# Patient Record
Sex: Female | Born: 1945 | Race: White | Hispanic: No | Marital: Married | Smoking: Never smoker
Health system: Southern US, Community
[De-identification: ages and names within clinical notes are randomized; demographics above are authoritative.]

## PROBLEM LIST (undated history)

## (undated) DIAGNOSIS — R112 Nausea with vomiting, unspecified: Secondary | ICD-10-CM

## (undated) DIAGNOSIS — Z9889 Other specified postprocedural states: Secondary | ICD-10-CM

## (undated) DIAGNOSIS — Z803 Family history of malignant neoplasm of breast: Secondary | ICD-10-CM

## (undated) DIAGNOSIS — D6851 Activated protein C resistance: Secondary | ICD-10-CM

## (undated) DIAGNOSIS — M8580 Other specified disorders of bone density and structure, unspecified site: Secondary | ICD-10-CM

## (undated) DIAGNOSIS — I8393 Asymptomatic varicose veins of bilateral lower extremities: Secondary | ICD-10-CM

## (undated) DIAGNOSIS — I82890 Acute embolism and thrombosis of other specified veins: Secondary | ICD-10-CM

## (undated) DIAGNOSIS — R760 Raised antibody titer: Secondary | ICD-10-CM

## (undated) DIAGNOSIS — Z460 Encounter for fitting and adjustment of spectacles and contact lenses: Secondary | ICD-10-CM

## (undated) HISTORY — DX: Acute embolism and thrombosis of other specified veins: I82.890

## (undated) HISTORY — DX: Other specified disorders of bone density and structure, unspecified site: M85.80

## (undated) HISTORY — DX: Raised antibody titer: R76.0

## (undated) HISTORY — PX: BARTHOLIN CYST MARSUPIALIZATION: SHX5383

## (undated) HISTORY — DX: Family history of malignant neoplasm of breast: Z80.3

## (undated) HISTORY — DX: Asymptomatic varicose veins of bilateral lower extremities: I83.93

## (undated) HISTORY — DX: Activated protein C resistance: D68.51

---
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
## 1975-05-11 HISTORY — PX: TUBAL LIGATION: SHX77

## 1995-05-11 DIAGNOSIS — I82890 Acute embolism and thrombosis of other specified veins: Secondary | ICD-10-CM

## 1995-05-11 HISTORY — PX: TOTAL ABDOMINAL HYSTERECTOMY W/ BILATERAL SALPINGOOPHORECTOMY: SHX83

## 1995-05-11 HISTORY — DX: Acute embolism and thrombosis of other specified veins: I82.890

## 1997-08-29 ENCOUNTER — Other Ambulatory Visit: Admission: RE | Admit: 1997-08-29 | Discharge: 1997-08-29 | Payer: Self-pay | Admitting: Obstetrics and Gynecology

## 1998-09-01 ENCOUNTER — Other Ambulatory Visit: Admission: RE | Admit: 1998-09-01 | Discharge: 1998-09-01 | Payer: Self-pay | Admitting: Obstetrics and Gynecology

## 1999-09-02 ENCOUNTER — Other Ambulatory Visit: Admission: RE | Admit: 1999-09-02 | Discharge: 1999-09-02 | Payer: Self-pay | Admitting: Obstetrics and Gynecology

## 2000-05-10 HISTORY — PX: FINGER SURGERY: SHX640

## 2000-06-10 HISTORY — PX: GANGLION CYST EXCISION: SHX1691

## 2000-06-21 ENCOUNTER — Ambulatory Visit (HOSPITAL_BASED_OUTPATIENT_CLINIC_OR_DEPARTMENT_OTHER): Admission: RE | Admit: 2000-06-21 | Discharge: 2000-06-21 | Payer: Self-pay | Admitting: Orthopedic Surgery

## 2000-09-05 ENCOUNTER — Other Ambulatory Visit: Admission: RE | Admit: 2000-09-05 | Discharge: 2000-09-05 | Payer: Self-pay | Admitting: Obstetrics and Gynecology

## 2001-01-25 ENCOUNTER — Ambulatory Visit: Admission: RE | Admit: 2001-01-25 | Discharge: 2001-01-25 | Payer: Self-pay

## 2003-10-03 ENCOUNTER — Other Ambulatory Visit: Admission: RE | Admit: 2003-10-03 | Discharge: 2003-10-03 | Payer: Self-pay | Admitting: Obstetrics and Gynecology

## 2004-10-13 ENCOUNTER — Other Ambulatory Visit: Admission: RE | Admit: 2004-10-13 | Discharge: 2004-10-13 | Payer: Self-pay | Admitting: Obstetrics and Gynecology

## 2004-10-19 ENCOUNTER — Encounter: Admission: RE | Admit: 2004-10-19 | Discharge: 2004-10-19 | Payer: Self-pay | Admitting: Obstetrics and Gynecology

## 2004-11-02 ENCOUNTER — Ambulatory Visit: Payer: Self-pay | Admitting: Hematology & Oncology

## 2005-11-08 ENCOUNTER — Other Ambulatory Visit: Admission: RE | Admit: 2005-11-08 | Discharge: 2005-11-08 | Payer: Self-pay | Admitting: Obstetrics and Gynecology

## 2006-05-23 ENCOUNTER — Encounter: Admission: RE | Admit: 2006-05-23 | Discharge: 2006-05-23 | Payer: Self-pay | Admitting: Obstetrics and Gynecology

## 2006-07-22 ENCOUNTER — Encounter: Admission: RE | Admit: 2006-07-22 | Discharge: 2006-07-22 | Payer: Self-pay | Admitting: Obstetrics and Gynecology

## 2006-09-28 ENCOUNTER — Encounter: Admission: RE | Admit: 2006-09-28 | Discharge: 2006-09-28 | Payer: Self-pay | Admitting: Obstetrics and Gynecology

## 2006-10-06 ENCOUNTER — Encounter: Admission: RE | Admit: 2006-10-06 | Discharge: 2006-10-06 | Payer: Self-pay | Admitting: Obstetrics and Gynecology

## 2006-11-10 ENCOUNTER — Other Ambulatory Visit: Admission: RE | Admit: 2006-11-10 | Discharge: 2006-11-10 | Payer: Self-pay | Admitting: Obstetrics and Gynecology

## 2006-12-07 ENCOUNTER — Encounter: Admission: RE | Admit: 2006-12-07 | Discharge: 2006-12-07 | Payer: Self-pay | Admitting: Obstetrics and Gynecology

## 2007-05-11 HISTORY — PX: CATARACT EXTRACTION: SUR2

## 2007-10-02 ENCOUNTER — Ambulatory Visit: Payer: Self-pay | Admitting: Emergency Medicine

## 2007-10-02 ENCOUNTER — Inpatient Hospital Stay (HOSPITAL_COMMUNITY): Admission: EM | Admit: 2007-10-02 | Discharge: 2007-10-03 | Payer: Self-pay | Admitting: Emergency Medicine

## 2007-10-04 ENCOUNTER — Encounter: Admission: RE | Admit: 2007-10-04 | Discharge: 2007-10-04 | Payer: Self-pay | Admitting: Obstetrics and Gynecology

## 2007-10-05 ENCOUNTER — Ambulatory Visit: Payer: Self-pay | Admitting: Emergency Medicine

## 2007-10-05 DIAGNOSIS — T17908A Unspecified foreign body in respiratory tract, part unspecified causing other injury, initial encounter: Secondary | ICD-10-CM | POA: Insufficient documentation

## 2007-10-05 DIAGNOSIS — J45909 Unspecified asthma, uncomplicated: Secondary | ICD-10-CM | POA: Insufficient documentation

## 2007-10-05 DIAGNOSIS — M1990 Unspecified osteoarthritis, unspecified site: Secondary | ICD-10-CM | POA: Insufficient documentation

## 2007-10-19 ENCOUNTER — Ambulatory Visit: Payer: Self-pay | Admitting: Emergency Medicine

## 2007-10-19 ENCOUNTER — Ambulatory Visit (HOSPITAL_COMMUNITY): Admission: RE | Admit: 2007-10-19 | Discharge: 2007-10-19 | Payer: Self-pay | Admitting: Emergency Medicine

## 2007-10-26 ENCOUNTER — Ambulatory Visit: Payer: Self-pay | Admitting: Emergency Medicine

## 2007-11-15 ENCOUNTER — Other Ambulatory Visit: Admission: RE | Admit: 2007-11-15 | Discharge: 2007-11-15 | Payer: Self-pay | Admitting: Obstetrics and Gynecology

## 2008-10-11 ENCOUNTER — Encounter: Admission: RE | Admit: 2008-10-11 | Discharge: 2008-10-11 | Payer: Self-pay | Admitting: Obstetrics and Gynecology

## 2008-11-15 ENCOUNTER — Other Ambulatory Visit: Admission: RE | Admit: 2008-11-15 | Discharge: 2008-11-15 | Payer: Self-pay | Admitting: Obstetrics and Gynecology

## 2009-10-13 ENCOUNTER — Encounter: Admission: RE | Admit: 2009-10-13 | Discharge: 2009-10-13 | Payer: Self-pay | Admitting: Obstetrics & Gynecology

## 2010-09-10 ENCOUNTER — Other Ambulatory Visit: Payer: Self-pay | Admitting: Obstetrics & Gynecology

## 2010-09-10 DIAGNOSIS — Z1231 Encounter for screening mammogram for malignant neoplasm of breast: Secondary | ICD-10-CM

## 2010-09-22 NOTE — Discharge Summary (Signed)
[name], [name]                  ACCOUNT NO.:  [id number]   MEDICAL RECORD NO.:  [id number]          PATIENT TYPE:  INP   LOCATION:  1417                         FACILITY:  [facility]   PHYSICIAN:  Leslye Peer, MD    DATE OF BIRTH:  1946/04/29   DATE OF ADMISSION:  10/02/2007  DATE OF DISCHARGE:  10/03/2007                               DISCHARGE SUMMARY   DISCHARGE DIAGNOSIS:  Aspiration of pill.   HISTORY OF PRESENT ILLNESS:  [name] is a pleasant 65 year old female  with little past medical history beyond possible mild intermittent  asthma which she has rarely used albuterol.  She tells of taking her  pills at home prior to her admission when suddenly became choked and had  a coughing attack.  She is confident she aspirated at least one of the  pills.  She is not sure whether she swallowed the other two pills that  were in her mouth or whether she aspirated these also.  She is admitted  for further evaluation and treatment.   DISCHARGE DIAGNOSES:  Aspiration of foreign body.   LABORATORY DATA:  Chest x-ray shows radiodensity, particularly in the  right lower lobe bronchus that is no longer readily apparently.  Questionable extraction, no pneumothorax.  WBC 5.7, hemoglobin 13.7,  hematocrit 30.9, platelets 230.  PTT is 29.  Sodium 141, potassium 3.9,  chloride 108, CO2 25, glucose 106, BUN 17, creatinine 0.76.   HOSPITAL COURSE:  Aspiration of foreign body.  She was admitted to  [facility].  She actively coughed out the pill.  Therefore, no  further interventions were needed.  She reached maximum hospital benefit  by Oct 03, 2007, and was discharged home.  She is to follow up with Dr.  Delton Coombes within one week, [phone].  Medication area as before, diet is as  before.  She is to increased activity slowly.   DISPOSITION:  Improved with having successfully coughed out her  aspirated pill.  Did not require further intervention.      Devra Dopp, MSN, ACNP      Leslye Peer, MD  Electronically Signed    SM/MEDQ  D:  10/31/2007  T:  10/31/2007  Job:  [id number]

## 2010-09-22 NOTE — H&P (Signed)
[name], [name]                  ACCOUNT NO.:  [id number]   MEDICAL RECORD NO.:  [id number]          PATIENT TYPE:  INP   LOCATION:  0103                         FACILITY:  [facility]   PHYSICIAN:  Leslye Peer, MD    DATE OF BIRTH:  [birth year]   DATE OF ADMISSION:  10/02/2007  DATE OF DISCHARGE:                              HISTORY & PHYSICAL   CHIEF COMPLAINT:  Aspiration of a pill.   BRIEF HISTORY:  [name] is a pleasant 65 year old woman with little  past medical history beyond some possible mild intermittent asthma for  which she very rarely uses albuterol.  She tells me that she was taking  three of her pills at the same time earlier this evening, when she  suddenly became choked up and had a coughing fit.  She is confident that  she aspirated at least one of the pills.  She is not sure whether she  swallowed the other 2 pills that were in her mouth or whether she  aspirated those as well.  She presented to the emergency department with  cough, chest discomfort and dyspnea.  She had a definite sensation of a  foreign body present on the right.  Chest x-ray confirmed a pill on the  right, probably in the distal bronchus intermedius versus the proximal  right lower lobe bronchus.  There was some associated distal segmental  atelectasis.  Pulmonary was asked to evaluate and admit the patient.  In  the emergency department, she subsequently coughed up the foreign body  which appeared to be an intact pill, and she felt much better.  Her  cough has begun to resolve.  She no longer has a foreign body sensation,  and her breathing is easier.   PAST MEDICAL HISTORY:  1. Possible mild asthma with no history of PFTs.  She has used      albuterol on as-needed basis in the past.  2. Osteoarthritis.  3. Borderline lipids.   PAST SURGICAL HISTORY:  1. History of a C-section.  2. Hysterectomy.   ALLERGIES:  FLOXIN.   HOME MEDICATIONS.:  1. Estrace 0.5 mg on Monday, Wednesday,  Friday.  2. Aspirin 81 mg daily.  3. Multivitamin once daily.  4. Glucosamine one capsule b.i.d.  5. Naproxen one daily.  6. Calcium plus vitamin D one daily.  7. Flaxseed oil one daily.   SOCIAL HISTORY:  The patient has worked as a Futures trader.  She denies any  significant occupational exposures.  She is a never smoker and rarely  uses alcohol.   FAMILY HISTORY:  Significant for coronary artery disease cancer and  diabetes mellitus.   PHYSICAL EXAMINATION:  GENERAL:  This a very well-appearing, comfortable  woman in no distress on room air.  VITAL SIGNS:  Temperature is 97.9, blood pressure 135/75, heart rate 79,  respiratory rate 20, SPO2 98% on room air.  HEENT:  The oropharynx is clear.  LUNGS:  She has a Mallampati one to two airway.  Clear without any  wheezing or rhonchi.  There is good air movement on the right  in the  midlung zone where the foreign body was originally located.  CARDIOVASCULAR:  There is regular rate and rhythm without murmur.  ABDOMEN:  Benign.  Extremities have no cyanosis, clubbing or edema.  NEUROLOGIC:  She has a nonfocal exam.   LABORATORY DATA:  White blood cell count 5.7, hematocrit 39.8, platelets  230, PTT 29.  Sodium 141, potassium 3.9, chloride 108, CO2 25, BUN 17,  creatinine 0.76, glucose 106, calcium 9.5.  A chest x-ray is as  indicated above.   IMPRESSION:  This very pleasant 65 year old woman without much past  medical history presents with aspirated  foreign body.  It appeared to  be originally in the distal bronchus intermedius versus the proximal  right lower lobe bronchus on chest x-ray.  She may have cleared it with  her cough as she now feels significantly better, and she did bring up  foreign material.  She still probably merits an inspection fiberoptic  bronchoscopy, and if possible,  remaining foreign body removal.  I will  repeat her chest x-ray now to see if the foreign body is still present  or if her atelectasis has  improved.  We will perform a fiberoptic  bronchoscopy either tonight or in the a.m. depending on her chest x-ray  results and my ability to obtain an adequate video bronchoscope,  the  proper forceps, snares, etc. to capture the foreign body.  I will follow  her for fever, sputum or other signs of infection since she is at risk  for post obstructive pneumonia.  I will not start empiric antibiotics at  this time.      Leslye Peer, MD  Electronically Signed     RSB/MEDQ  D:  10/03/2007  T:  10/03/2007  Job:  [id number]

## 2010-09-25 NOTE — Op Note (Signed)
[place]  Patient:    [name], [name]                         MRN: [id number] Proc. Date: 06/21/00 Adm. Date:  [id number] Attending:  Susa Day                           Operative Report  PREOPERATIVE DIAGNOSIS:  Enlarging mass, left long finger, flexor sheath at A1/A2 pulley.  POSTOPERATIVE DIAGNOSIS:  Large flexor sheath ganglion, left long finger.  OPERATION PERFORMED:  Excisional biopsy of left long finger flexor sheath ganglion.  SURGEON:  Katy Fitch. Sypher, Montez Hageman., M.D.  ASSISTANT:  Jonni Sanger, P.A.  ANESTHESIA:  0.25% Marcaine and 2% lidocaine metacarpal head level block left long finger supplemented by IV sedation.  SUPERVISING ANESTHESIOLOGIST:  Dr. Krista Blue.  INDICATIONS FOR PROCEDURE:  The patient is a 65 year old woman who noted a mass enlarging in her left palm adjacent to the long finger flexor sheath. She sought a hand surgery consult.  The mass was consistent with a flexor sheath ganglion.  Due to failure to respond to nonoperative management, the patient is brought to the operating room at this time for excision of her left long finger flexor sheath ganglion.  DESCRIPTION OF PROCEDURE:  [name] was brought to the operating room and placed in supine position on the operating table.  Following light sedation, the left arm was prepped with Betadine soap and solution and sterilely draped. Following exsanguination of the limb with an Esmarch bandage, the arterial tourniquet was inflated to 220 mmHg.  The procedure commenced with a short oblique incision directly over the mass.  Subcutaneous tissues were carefully divided taking care to identify the ulnar proper digital nerve that was tented directly over the mass.  The masses was circumferentially dissected and blunt retractors placed to protect the neurovascular structures.  The mass was then excised with scissors and a rongeur.  Care was taken to remove  all remnants of the cyst membrane from the flexor sheath.  The bed of the cyst was then electrocauterized with bipolar current.  The wound was then repaired with interrupted sutures of 5-0 nylon.  A compressive dressing was applied with Xeroflo, sterile gauze and Coban.  There were no apparent complications.  The tourniquet was released and immediate capillary refill in all fingers.  [name] was given prescriptions for Vicodin 5 mg 1 or 2 tablets p.o. q.4-6h. p.r.n. pain, 20 tablets without refill.  She will return to our office for interval follow-up in approximately one week. DD:  06/21/00 TD:  06/21/00 Job: [id number]

## 2010-10-16 ENCOUNTER — Ambulatory Visit
Admission: RE | Admit: 2010-10-16 | Discharge: 2010-10-16 | Disposition: A | Discharge status: Home / Self Care | Payer: BC Managed Care – PPO | Source: Ambulatory Visit | Attending: Obstetrics & Gynecology | Admitting: Obstetrics & Gynecology

## 2010-10-16 ENCOUNTER — Other Ambulatory Visit: Payer: Self-pay | Admitting: Obstetrics & Gynecology

## 2010-10-16 DIAGNOSIS — Z1231 Encounter for screening mammogram for malignant neoplasm of breast: Secondary | ICD-10-CM

## 2010-10-19 ENCOUNTER — Other Ambulatory Visit: Payer: Self-pay | Admitting: Obstetrics & Gynecology

## 2010-10-19 DIAGNOSIS — N644 Mastodynia: Secondary | ICD-10-CM

## 2010-10-23 ENCOUNTER — Ambulatory Visit
Admission: RE | Admit: 2010-10-23 | Discharge: 2010-10-23 | Disposition: A | Discharge status: Home / Self Care | Payer: BC Managed Care – PPO | Source: Ambulatory Visit | Attending: Obstetrics & Gynecology | Admitting: Obstetrics & Gynecology

## 2010-10-23 ENCOUNTER — Other Ambulatory Visit: Payer: Self-pay | Admitting: Obstetrics & Gynecology

## 2010-10-23 DIAGNOSIS — N644 Mastodynia: Secondary | ICD-10-CM

## 2011-02-03 LAB — CBC
MCV: 93
Platelets: 230
RBC: 4.28
RDW: 11.7

## 2011-02-03 LAB — DIFFERENTIAL
Basophils Absolute: 0
Eosinophils Relative: 2
Monocytes Absolute: 0.4
Neutro Abs: 2.5
Neutrophils Relative %: 44

## 2011-02-03 LAB — BASIC METABOLIC PANEL
BUN: 17
CO2: 25
Calcium: 9.5
Creatinine, Ser: 0.76
GFR calc Af Amer: >60
GFR calc non Af Amer: >60
Glucose, Bld: 106 — ABNORMAL HIGH
Sodium: 141

## 2011-03-24 ENCOUNTER — Other Ambulatory Visit: Payer: Self-pay | Admitting: Obstetrics & Gynecology

## 2011-03-24 DIAGNOSIS — M8580 Other specified disorders of bone density and structure, unspecified site: Secondary | ICD-10-CM

## 2011-04-07 ENCOUNTER — Ambulatory Visit
Admission: RE | Admit: 2011-04-07 | Discharge: 2011-04-07 | Disposition: A | Discharge status: Home / Self Care | Payer: BC Managed Care – PPO | Source: Ambulatory Visit | Attending: Obstetrics & Gynecology | Admitting: Obstetrics & Gynecology

## 2011-04-07 DIAGNOSIS — M8580 Other specified disorders of bone density and structure, unspecified site: Secondary | ICD-10-CM

## 2011-09-29 ENCOUNTER — Other Ambulatory Visit: Payer: Self-pay | Admitting: Obstetrics & Gynecology

## 2011-09-29 DIAGNOSIS — Z1231 Encounter for screening mammogram for malignant neoplasm of breast: Secondary | ICD-10-CM

## 2011-10-20 ENCOUNTER — Ambulatory Visit
Admission: RE | Admit: 2011-10-20 | Discharge: 2011-10-20 | Disposition: A | Discharge status: Home / Self Care | Payer: Medicare Other | Source: Ambulatory Visit | Attending: Obstetrics & Gynecology | Admitting: Obstetrics & Gynecology

## 2011-10-20 DIAGNOSIS — Z1231 Encounter for screening mammogram for malignant neoplasm of breast: Secondary | ICD-10-CM

## 2012-02-14 DIAGNOSIS — H43399 Other vitreous opacities, unspecified eye: Secondary | ICD-10-CM | POA: Diagnosis not present

## 2012-02-14 DIAGNOSIS — H2510 Age-related nuclear cataract, unspecified eye: Secondary | ICD-10-CM | POA: Diagnosis not present

## 2012-02-14 DIAGNOSIS — H40019 Open angle with borderline findings, low risk, unspecified eye: Secondary | ICD-10-CM | POA: Diagnosis not present

## 2012-02-14 DIAGNOSIS — H35379 Puckering of macula, unspecified eye: Secondary | ICD-10-CM | POA: Diagnosis not present

## 2012-03-06 DIAGNOSIS — M899 Disorder of bone, unspecified: Secondary | ICD-10-CM | POA: Diagnosis not present

## 2012-03-06 DIAGNOSIS — Z23 Encounter for immunization: Secondary | ICD-10-CM | POA: Diagnosis not present

## 2012-03-06 DIAGNOSIS — Z0000 Encounter for general adult medical examination without abnormal findings: Secondary | ICD-10-CM | POA: Diagnosis not present

## 2012-03-06 DIAGNOSIS — Z131 Encounter for screening for diabetes mellitus: Secondary | ICD-10-CM | POA: Diagnosis not present

## 2012-03-06 DIAGNOSIS — E7800 Pure hypercholesterolemia, unspecified: Secondary | ICD-10-CM | POA: Diagnosis not present

## 2012-03-06 DIAGNOSIS — M949 Disorder of cartilage, unspecified: Secondary | ICD-10-CM | POA: Diagnosis not present

## 2012-04-18 DIAGNOSIS — R7989 Other specified abnormal findings of blood chemistry: Secondary | ICD-10-CM | POA: Diagnosis not present

## 2012-04-18 DIAGNOSIS — Z124 Encounter for screening for malignant neoplasm of cervix: Secondary | ICD-10-CM | POA: Diagnosis not present

## 2012-04-18 DIAGNOSIS — Z0000 Encounter for general adult medical examination without abnormal findings: Secondary | ICD-10-CM | POA: Diagnosis not present

## 2012-04-18 DIAGNOSIS — Z01419 Encounter for gynecological examination (general) (routine) without abnormal findings: Secondary | ICD-10-CM | POA: Diagnosis not present

## 2012-04-24 DIAGNOSIS — Z0000 Encounter for general adult medical examination without abnormal findings: Secondary | ICD-10-CM | POA: Diagnosis not present

## 2012-06-29 DIAGNOSIS — L821 Other seborrheic keratosis: Secondary | ICD-10-CM | POA: Diagnosis not present

## 2012-06-29 DIAGNOSIS — L819 Disorder of pigmentation, unspecified: Secondary | ICD-10-CM | POA: Diagnosis not present

## 2012-06-29 DIAGNOSIS — L219 Seborrheic dermatitis, unspecified: Secondary | ICD-10-CM | POA: Diagnosis not present

## 2012-06-29 DIAGNOSIS — L988 Other specified disorders of the skin and subcutaneous tissue: Secondary | ICD-10-CM | POA: Diagnosis not present

## 2012-09-19 ENCOUNTER — Other Ambulatory Visit: Payer: Self-pay

## 2012-09-19 DIAGNOSIS — Z1231 Encounter for screening mammogram for malignant neoplasm of breast: Secondary | ICD-10-CM

## 2012-11-01 ENCOUNTER — Ambulatory Visit
Admission: RE | Admit: 2012-11-01 | Discharge: 2012-11-01 | Disposition: A | Discharge status: Home / Self Care | Payer: Medicare Other | Source: Ambulatory Visit

## 2012-11-01 DIAGNOSIS — Z1231 Encounter for screening mammogram for malignant neoplasm of breast: Secondary | ICD-10-CM

## 2012-11-30 DIAGNOSIS — G5600 Carpal tunnel syndrome, unspecified upper limb: Secondary | ICD-10-CM | POA: Diagnosis not present

## 2012-11-30 DIAGNOSIS — M19049 Primary osteoarthritis, unspecified hand: Secondary | ICD-10-CM | POA: Diagnosis not present

## 2012-12-01 ENCOUNTER — Other Ambulatory Visit: Payer: Self-pay | Admitting: Orthopedic Surgery

## 2012-12-01 DIAGNOSIS — N3000 Acute cystitis without hematuria: Secondary | ICD-10-CM | POA: Diagnosis not present

## 2012-12-13 DIAGNOSIS — N3000 Acute cystitis without hematuria: Secondary | ICD-10-CM | POA: Diagnosis not present

## 2012-12-13 DIAGNOSIS — R300 Dysuria: Secondary | ICD-10-CM | POA: Diagnosis not present

## 2012-12-14 ENCOUNTER — Encounter (HOSPITAL_BASED_OUTPATIENT_CLINIC_OR_DEPARTMENT_OTHER): Payer: Self-pay | Admitting: *Deleted

## 2012-12-14 NOTE — Progress Notes (Signed)
No labs needed

## 2012-12-20 ENCOUNTER — Ambulatory Visit (HOSPITAL_BASED_OUTPATIENT_CLINIC_OR_DEPARTMENT_OTHER): Payer: Medicare Other | Admitting: *Deleted

## 2012-12-20 ENCOUNTER — Ambulatory Visit (HOSPITAL_BASED_OUTPATIENT_CLINIC_OR_DEPARTMENT_OTHER)
Admission: RE | Admit: 2012-12-20 | Discharge: 2012-12-20 | Disposition: A | Discharge status: Home / Self Care | Payer: Medicare Other | Source: Ambulatory Visit | Attending: Orthopedic Surgery | Admitting: Orthopedic Surgery

## 2012-12-20 ENCOUNTER — Encounter (HOSPITAL_BASED_OUTPATIENT_CLINIC_OR_DEPARTMENT_OTHER)
Admission: RE | Disposition: A | Discharge status: Home / Self Care | Payer: Self-pay | Source: Ambulatory Visit | Attending: Orthopedic Surgery

## 2012-12-20 ENCOUNTER — Encounter (HOSPITAL_BASED_OUTPATIENT_CLINIC_OR_DEPARTMENT_OTHER): Payer: Self-pay | Admitting: *Deleted

## 2012-12-20 DIAGNOSIS — Z883 Allergy status to other anti-infective agents status: Secondary | ICD-10-CM | POA: Diagnosis not present

## 2012-12-20 DIAGNOSIS — G5600 Carpal tunnel syndrome, unspecified upper limb: Secondary | ICD-10-CM | POA: Diagnosis not present

## 2012-12-20 HISTORY — PX: CARPAL TUNNEL RELEASE: SHX101

## 2012-12-20 HISTORY — DX: Encounter for fitting and adjustment of spectacles and contact lenses: Z46.0

## 2012-12-20 SURGERY — CARPAL TUNNEL RELEASE
Anesthesia: Regional | Site: Wrist | Laterality: Right | Wound class: Clean

## 2012-12-20 MED ORDER — OXYCODONE HCL 5 MG PO TABS
5.0000 mg | ORAL_TABLET | Freq: Once | ORAL | Status: AC
  Administered 2012-12-20: 5 mg via ORAL

## 2012-12-20 MED ORDER — HYDROCODONE-ACETAMINOPHEN 5-325 MG PO TABS: 1.0000 | ORAL_TABLET | Freq: Four times a day (QID) | ORAL | Status: DC | PRN

## 2012-12-20 MED ORDER — MIDAZOLAM HCL 2 MG/2ML IJ SOLN: 1.0000 mg | INTRAMUSCULAR | Status: DC | PRN

## 2012-12-20 MED ORDER — CHLORHEXIDINE GLUCONATE 4 % EX LIQD: 60.0000 mL | Freq: Once | CUTANEOUS | Status: DC

## 2012-12-20 MED ORDER — ONDANSETRON HCL 4 MG/2ML IJ SOLN: 4.0000 mg | Freq: Once | INTRAMUSCULAR | Status: DC | PRN

## 2012-12-20 MED ORDER — BUPIVACAINE HCL (PF) 0.25 % IJ SOLN
INTRAMUSCULAR | Status: DC | PRN
  Administered 2012-12-20: 6 mL

## 2012-12-20 MED ORDER — LIDOCAINE HCL (PF) 0.5 % IJ SOLN
INTRAMUSCULAR | Status: DC | PRN
  Administered 2012-12-20: 30 mL via INTRAVENOUS

## 2012-12-20 MED ORDER — FENTANYL CITRATE 0.05 MG/ML IJ SOLN: 50.0000 ug | INTRAMUSCULAR | Status: DC | PRN

## 2012-12-20 MED ORDER — MIDAZOLAM HCL 5 MG/5ML IJ SOLN
INTRAMUSCULAR | Status: DC | PRN
  Administered 2012-12-20: 1 mg via INTRAVENOUS

## 2012-12-20 MED ORDER — FENTANYL CITRATE 0.05 MG/ML IJ SOLN
INTRAMUSCULAR | Status: DC | PRN
  Administered 2012-12-20: 25 ug via INTRAVENOUS
  Administered 2012-12-20: 50 ug via INTRAVENOUS

## 2012-12-20 MED ORDER — FENTANYL CITRATE 0.05 MG/ML IJ SOLN
25.0000 ug | INTRAMUSCULAR | Status: DC | PRN
  Administered 2012-12-20: 50 ug via INTRAVENOUS
  Administered 2012-12-20: 25 ug via INTRAVENOUS

## 2012-12-20 MED ORDER — PROPOFOL INFUSION 10 MG/ML OPTIME
INTRAVENOUS | Status: DC | PRN
  Administered 2012-12-20: 100 ug/kg/min via INTRAVENOUS

## 2012-12-20 MED ORDER — CEFAZOLIN SODIUM-DEXTROSE 2-3 GM-% IV SOLR
2.0000 g | INTRAVENOUS | Status: AC
  Administered 2012-12-20: 2 g via INTRAVENOUS

## 2012-12-20 MED ORDER — LACTATED RINGERS IV SOLN
INTRAVENOUS | Status: DC
  Administered 2012-12-20: 09:00:00 via INTRAVENOUS

## 2012-12-20 SURGICAL SUPPLY — 40 items
BANDAGE GAUZE ELAST BULKY 4 IN (GAUZE/BANDAGES/DRESSINGS) ×2 IMPLANT
BLADE SURG 15 STRL LF DISP TIS (BLADE) ×1 IMPLANT
BLADE SURG 15 STRL SS (BLADE) ×1
BNDG COHESIVE 3X5 TAN STRL LF (GAUZE/BANDAGES/DRESSINGS) ×2 IMPLANT
BNDG ESMARK 4X9 LF (GAUZE/BANDAGES/DRESSINGS) ×2 IMPLANT
CHLORAPREP W/TINT 26ML (MISCELLANEOUS) ×2 IMPLANT
CLOTH BEACON ORANGE TIMEOUT ST (SAFETY) ×2 IMPLANT
CORDS BIPOLAR (ELECTRODE) ×2 IMPLANT
COVER MAYO STAND STRL (DRAPES) ×2 IMPLANT
COVER TABLE BACK 60X90 (DRAPES) ×2 IMPLANT
CUFF TOURNIQUET SINGLE 18IN (TOURNIQUET CUFF) ×2 IMPLANT
DRAPE EXTREMITY T 121X128X90 (DRAPE) ×2 IMPLANT
DRAPE SURG 17X23 STRL (DRAPES) ×2 IMPLANT
DRSG KUZMA FLUFF (GAUZE/BANDAGES/DRESSINGS) ×2 IMPLANT
GAUZE XEROFORM 1X8 LF (GAUZE/BANDAGES/DRESSINGS) ×2 IMPLANT
GLOVE BIO SURGEON STRL SZ 6.5 (GLOVE) IMPLANT
GLOVE BIOGEL PI IND STRL 7.0 (GLOVE) ×1 IMPLANT
GLOVE BIOGEL PI IND STRL 8.5 (GLOVE) ×1 IMPLANT
GLOVE BIOGEL PI INDICATOR 7.0 (GLOVE) ×1
GLOVE BIOGEL PI INDICATOR 8.5 (GLOVE) ×1
GLOVE ECLIPSE 6.5 STRL STRAW (GLOVE) ×2 IMPLANT
GLOVE EXAM NITRILE LRG STRL (GLOVE) ×2 IMPLANT
GLOVE SURG ORTHO 8.0 STRL STRW (GLOVE) ×2 IMPLANT
GOWN BRE IMP PREV XXLGXLNG (GOWN DISPOSABLE) ×2 IMPLANT
GOWN PREVENTION PLUS XLARGE (GOWN DISPOSABLE) ×2 IMPLANT
NEEDLE 27GAX1X1/2 (NEEDLE) ×2 IMPLANT
NS IRRIG 1000ML POUR BTL (IV SOLUTION) ×2 IMPLANT
PACK BASIN DAY SURGERY FS (CUSTOM PROCEDURE TRAY) ×2 IMPLANT
PAD CAST 3X4 CTTN HI CHSV (CAST SUPPLIES) ×1 IMPLANT
PADDING CAST ABS 4INX4YD NS (CAST SUPPLIES)
PADDING CAST ABS COTTON 4X4 ST (CAST SUPPLIES) IMPLANT
PADDING CAST COTTON 3X4 STRL (CAST SUPPLIES) ×1
SPONGE GAUZE 4X4 12PLY (GAUZE/BANDAGES/DRESSINGS) ×2 IMPLANT
STOCKINETTE 4X48 STRL (DRAPES) ×2 IMPLANT
SUT VICRYL 4-0 PS2 18IN ABS (SUTURE) IMPLANT
SUT VICRYL RAPIDE 4/0 PS 2 (SUTURE) ×2 IMPLANT
SYR BULB 3OZ (MISCELLANEOUS) ×2 IMPLANT
SYR CONTROL 10ML LL (SYRINGE) ×2 IMPLANT
TOWEL OR 17X24 6PK STRL BLUE (TOWEL DISPOSABLE) ×2 IMPLANT
UNDERPAD 30X30 INCONTINENT (UNDERPADS AND DIAPERS) ×2 IMPLANT

## 2012-12-20 NOTE — Transfer of Care (Signed)
Immediate Anesthesia Transfer of Care Note  Patient: [name]  Procedure(s) Performed: Procedure(s): CARPAL TUNNEL RELEASE (Right)  Patient Location: PACU  Anesthesia Type:Bier block  Level of Consciousness: awake, alert  and oriented  Airway & Oxygen Therapy: Patient Spontanous Breathing and Patient connected to face mask oxygen  Post-op Assessment: Report given to PACU RN, Post -op Vital signs reviewed and stable and Patient moving all extremities  Post vital signs: Reviewed and stable  Complications: No apparent anesthesia complications

## 2012-12-20 NOTE — Anesthesia Procedure Notes (Signed)
Procedure Name: MAC Date/Time: 12/20/2012 10:23 AM Performed by: Meyer Russel Pre-anesthesia Checklist: Patient identified, Emergency Drugs available, Suction available and Patient being monitored Patient Re-evaluated:Patient Re-evaluated prior to inductionOxygen Delivery Method: Simple face mask Preoxygenation: Pre-oxygenation with 100% oxygen Intubation Type: IV induction Ventilation: Mask ventilation without difficulty Dental Injury: Teeth and Oropharynx as per pre-operative assessment

## 2012-12-20 NOTE — Op Note (Signed)
Other Dictation: Dictation Number [id number]

## 2012-12-20 NOTE — Brief Op Note (Signed)
12/20/2012  10:45 AM  PATIENT:  Sharmaine Base  67 y.o. female  PRE-OPERATIVE DIAGNOSIS:  RIGHT CARPAL TUNNEL SYNDROME  POST-OPERATIVE DIAGNOSIS:  RIGHT CARPAL TUNNEL SYNDROME  PROCEDURE:  Procedure(s): CARPAL TUNNEL RELEASE (Right)  SURGEON:  Surgeon(s) and Role:    * Nicki Reaper, MD - Primary  PHYSICIAN ASSISTANT:   ASSISTANTS: none   ANESTHESIA:   local and regional  EBL:  Total I/O In: 200 [I.V.:200] Out: -   BLOOD ADMINISTERED:none  DRAINS: none   LOCAL MEDICATIONS USED:  MARCAINE     SPECIMEN:  No Specimen  DISPOSITION OF SPECIMEN:  N/A  COUNTS:  YES  TOURNIQUET:   Total Tourniquet Time Documented: Forearm (Right) - 16 minutes Total: Forearm (Right) - 16 minutes   DICTATION: .Other Dictation: Dictation Number [id number]  PLAN OF CARE: Discharge to home after PACU  PATIENT DISPOSITION:  PACU - hemodynamically stable.

## 2012-12-20 NOTE — Op Note (Signed)
[name], [name] NO.:  [id number]  MEDICAL RECORD NO.:  [id number]  LOCATION:                                 FACILITY:  PHYSICIAN:  Cindee Salt, M.D.            DATE OF BIRTH:  DATE OF PROCEDURE:  12/20/2012 DATE OF DISCHARGE:                              OPERATIVE REPORT   PREOPERATIVE DIAGNOSIS:  Carpal tunnel syndrome, right hand.  POSTOPERATIVE DIAGNOSIS:  Carpal tunnel syndrome, right hand.  OPERATION:  Decompression, right median nerve.  SURGEON:  Cindee Salt, MD  ANESTHESIA:  Forearm-based IV regional with local infiltration.  ANESTHESIOLOGIST:  Sheldon Silvan, MD  HISTORY:  The patient is a 67 year old female with a history of carpal tunnel syndrome.  Nerve conductions positive.  This has not responded to conservative treatment.  She has elected to undergo surgical decompression.  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  Pre, peri, and postoperative course have been discussed.  She is aware that there is no guarantee with the surgery; possibility of infection; recurrence of injury to arteries, nerves, tendons, incomplete relief of symptoms, dystrophy.  In the preoperative area, the patient is seen, the extremity marked by both the patient and surgeon.  Antibiotic given.  DESCRIPTION OF PROCEDURE:  The patient was brought to the operating room, where a forearm-based IV regional anesthetic was carried out without difficulty.  She was prepped using ChloraPrep, supine position, right arm free.  A 3-minute dry time was allowed.  Time-out taken, confirming the patient and procedure.  A longitudinal incision was made in the right palm, carried down through subcutaneous tissue.  Bleeders were electrocauterized with bipolar.  Palmar fascia was split. Superficial palmar arch identified.  The flexor tendon to the ring little finger identified to the ulnar side of median nerve.  Carpal retinaculum was  incised with sharp dissection.  Right angle and Sewall retractor were placed between skin and forearm fascia.  The fascia released for approximately a centimeter and half proximal to the wrist crease under direct vision.  Canal was explored.  Air compression to the nerve was apparent.  Motor branch was noted to enter into muscle.  The wound was copiously irrigated with saline.  The skin was then closed with interrupted 4-0 Vicryl Rapide sutures.  A local infiltration with 0.25% Marcaine without epinephrine was given, 7 mL was used.  Sterile compressive dressing with fingers was applied.  On deflation of the tourniquet, all fingers immediately pinked.  She was taken to the recovery room for observation in satisfactory condition.  She will be discharged home to return to the [facility] in 1 week on [place].          ______________________________ Cindee Salt, M.D.     GK/MEDQ  D:  12/20/2012  T:  12/20/2012  Job:  [id number]

## 2012-12-20 NOTE — H&P (Signed)
[name] is a 67 year old right hand dominant female who comes in complaining of numbness and tingling right greater than left. This has been going on for at least 8 years. She had nerve conductions done in 2007 with a diagnosis of carpal tunnel syndrome but has probably been going on longer than that. She has been wearing a brace. She is not taking any medicine. She has no history of injury to the hand or neck. She is awakened 5-6 out of 7 nights. She has been wearing a brace which no longer helps. She has no history of diabetes or thyroid problems. She does have a history of arthritis. There is a family history of diabetes and thyroid problems. She has no history of gout. She has been tested for these. She complains of intermittent, moderate tingling pain with numbness. Certain positions increase this for her and nothing seems to relieve it. [name] has had her nerve conduction studies by Dr. 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 This has been injected on one occasion with recurrence. Nerve conductions are positive on her right side for carpal tunnel syndrome and to a lesser extent on her left side. She is not complaining of the CMC joints. She says she is unable to tolerate the CMC splint hard at night as well but it is the numbness and tingling awakening her at night. In that the injection gave her excellent relief for a period of time and this has recurrence we would recommend she continue surgical release of the carpal canal.   PAST MEDICAL HISTORY: She is allergic to Floxin. She is on Estrace. She has had C-sections, hysterectomy, a ganglion cyst excised by Dr. Teressa Senter many years ago and cataract surgery.  FAMILY H ISTORY: Positive for diabetes, otherwise negative.  SOCIAL HISTORY: She does not smoke. She drinks socially. She is married and a homemaker.   REVIEW OF SYSTEMS: Positive for contacts, blood clots,  cataracts, otherwise negative for 14 points.  [name] is an 67 y.o. female.   Chief Complaint: CTS Rt HPI: see above  Past Medical History  Diagnosis Date  . Contact lens/glasses fitting     wears one contact lt eye  . Medical history non-contributory     Past Surgical History  Procedure Laterality Date  . Cesarean section      x2  . Finger surgery  2002    lt long finger mass  . Abdominal hysterectomy    . Eye surgery      rt cataract  . Tubal ligation    . Bartholin gland cyst excision      History reviewed. No pertinent family history. Social History:  reports that she has never smoked. She does not have any smokeless tobacco history on file. She reports that  drinks alcohol. She reports that she does not use illicit drugs.  Allergies:  Allergies  Allergen Reactions  . Ofloxacin Itching    swelling    No prescriptions prior to admission    No results found for this or any previous visit (from the past 48 hour(s)).  No results found.   Pertinent items are noted in HPI.  Height 5\' 6"  (1.676 m), weight 67.586 kg (149 lb).  General appearance: alert, cooperative and appears stated age Head: Normocephalic, without obvious abnormality Neck: no JVD Resp: clear to auscultation bilaterally Cardio: regular rate and rhythm, S1,  S2 normal, no murmur, click, rub or gallop GI: soft, non-tender; bowel sounds normal; no masses,  no organomegaly Extremities: extremities normal, atraumatic, no cyanosis or edema Pulses: 2+ and symmetric Skin: Skin color, texture, turgor normal. No rashes or lesions Neurologic: Grossly normal Incision/Wound: na  Assessment/Plan Cts rt Plan: CTR rt  Fountain Derusha R 12/20/2012, 8:35 AM

## 2012-12-20 NOTE — Anesthesia Preprocedure Evaluation (Addendum)
Anesthesia Evaluation  Patient identified by MRN, date of birth, ID band Patient awake    Reviewed: Allergy & Precautions, H&P , NPO status , Patient's Chart, lab work & pertinent test results  Airway Mallampati: I TM Distance: >3 FB Neck ROM: Full    Dental  (+) Teeth Intact and Dental Advisory Given   Pulmonary  breath sounds clear to auscultation        Cardiovascular Rhythm:Regular Rate:Normal     Neuro/Psych    GI/Hepatic   Endo/Other    Renal/GU      Musculoskeletal   Abdominal   Peds  Hematology   Anesthesia Other Findings   Reproductive/Obstetrics                           Anesthesia Physical Anesthesia Plan  ASA: I  Anesthesia Plan: MAC and Bier Block   Post-op Pain Management:    Induction: Intravenous  Airway Management Planned: Simple Face Mask  Additional Equipment:   Intra-op Plan:   Post-operative Plan:   Informed Consent: I have reviewed the patients History and Physical, chart, labs and discussed the procedure including the risks, benefits and alternatives for the proposed anesthesia with the patient or authorized representative who has indicated his/her understanding and acceptance.   Dental advisory given  Plan Discussed with: CRNA, Anesthesiologist and Surgeon  Anesthesia Plan Comments:         Anesthesia Quick Evaluation  

## 2012-12-20 NOTE — Anesthesia Postprocedure Evaluation (Signed)
  Anesthesia Post-op Note  Patient: [name]  Procedure(s) Performed: Procedure(s): CARPAL TUNNEL RELEASE (Right)  Patient Location: PACU  Anesthesia Type:Bier block  Level of Consciousness: awake, alert  and oriented  Airway and Oxygen Therapy: Patient Spontanous Breathing and Patient connected to face mask oxygen  Post-op Pain: none  Post-op Assessment: Post-op Vital signs reviewed  Post-op Vital Signs: Reviewed  Complications: No apparent anesthesia complications

## 2012-12-21 ENCOUNTER — Encounter (HOSPITAL_BASED_OUTPATIENT_CLINIC_OR_DEPARTMENT_OTHER): Payer: Self-pay | Admitting: Orthopedic Surgery

## 2013-01-11 DIAGNOSIS — H40019 Open angle with borderline findings, low risk, unspecified eye: Secondary | ICD-10-CM | POA: Diagnosis not present

## 2013-02-02 ENCOUNTER — Encounter: Payer: Self-pay | Admitting: Obstetrics & Gynecology

## 2013-02-07 DIAGNOSIS — M19049 Primary osteoarthritis, unspecified hand: Secondary | ICD-10-CM | POA: Diagnosis not present

## 2013-02-07 DIAGNOSIS — G5600 Carpal tunnel syndrome, unspecified upper limb: Secondary | ICD-10-CM | POA: Diagnosis not present

## 2013-02-10 DIAGNOSIS — Z23 Encounter for immunization: Secondary | ICD-10-CM | POA: Diagnosis not present

## 2013-02-21 DIAGNOSIS — G5600 Carpal tunnel syndrome, unspecified upper limb: Secondary | ICD-10-CM | POA: Diagnosis not present

## 2013-02-21 DIAGNOSIS — M19049 Primary osteoarthritis, unspecified hand: Secondary | ICD-10-CM | POA: Diagnosis not present

## 2013-02-28 DIAGNOSIS — M19049 Primary osteoarthritis, unspecified hand: Secondary | ICD-10-CM | POA: Diagnosis not present

## 2013-02-28 DIAGNOSIS — G5600 Carpal tunnel syndrome, unspecified upper limb: Secondary | ICD-10-CM | POA: Diagnosis not present

## 2013-03-07 DIAGNOSIS — M19049 Primary osteoarthritis, unspecified hand: Secondary | ICD-10-CM | POA: Diagnosis not present

## 2013-03-07 DIAGNOSIS — G5600 Carpal tunnel syndrome, unspecified upper limb: Secondary | ICD-10-CM | POA: Diagnosis not present

## 2013-03-08 DIAGNOSIS — E7800 Pure hypercholesterolemia, unspecified: Secondary | ICD-10-CM | POA: Diagnosis not present

## 2013-03-08 DIAGNOSIS — Z131 Encounter for screening for diabetes mellitus: Secondary | ICD-10-CM | POA: Diagnosis not present

## 2013-03-08 DIAGNOSIS — Z0000 Encounter for general adult medical examination without abnormal findings: Secondary | ICD-10-CM | POA: Diagnosis not present

## 2013-03-08 DIAGNOSIS — M899 Disorder of bone, unspecified: Secondary | ICD-10-CM | POA: Diagnosis not present

## 2013-03-14 DIAGNOSIS — M19049 Primary osteoarthritis, unspecified hand: Secondary | ICD-10-CM | POA: Diagnosis not present

## 2013-03-14 DIAGNOSIS — G5600 Carpal tunnel syndrome, unspecified upper limb: Secondary | ICD-10-CM | POA: Diagnosis not present

## 2013-03-19 DIAGNOSIS — Z961 Presence of intraocular lens: Secondary | ICD-10-CM | POA: Diagnosis not present

## 2013-03-19 DIAGNOSIS — H40019 Open angle with borderline findings, low risk, unspecified eye: Secondary | ICD-10-CM | POA: Diagnosis not present

## 2013-03-19 DIAGNOSIS — H43819 Vitreous degeneration, unspecified eye: Secondary | ICD-10-CM | POA: Diagnosis not present

## 2013-03-19 DIAGNOSIS — H04129 Dry eye syndrome of unspecified lacrimal gland: Secondary | ICD-10-CM | POA: Diagnosis not present

## 2013-03-19 DIAGNOSIS — H35379 Puckering of macula, unspecified eye: Secondary | ICD-10-CM | POA: Diagnosis not present

## 2013-03-19 DIAGNOSIS — H2510 Age-related nuclear cataract, unspecified eye: Secondary | ICD-10-CM | POA: Diagnosis not present

## 2013-03-21 DIAGNOSIS — M19049 Primary osteoarthritis, unspecified hand: Secondary | ICD-10-CM | POA: Diagnosis not present

## 2013-03-21 DIAGNOSIS — G5600 Carpal tunnel syndrome, unspecified upper limb: Secondary | ICD-10-CM | POA: Diagnosis not present

## 2013-05-17 DIAGNOSIS — J069 Acute upper respiratory infection, unspecified: Secondary | ICD-10-CM | POA: Diagnosis not present

## 2013-06-28 DIAGNOSIS — M6530 Trigger finger, unspecified finger: Secondary | ICD-10-CM | POA: Diagnosis not present

## 2013-07-25 ENCOUNTER — Ambulatory Visit: Payer: Self-pay | Admitting: Obstetrics & Gynecology

## 2013-07-26 ENCOUNTER — Encounter: Payer: Self-pay | Admitting: Obstetrics & Gynecology

## 2013-07-26 DIAGNOSIS — M6530 Trigger finger, unspecified finger: Secondary | ICD-10-CM | POA: Diagnosis not present

## 2013-07-27 ENCOUNTER — Telehealth: Payer: Self-pay | Admitting: Obstetrics & Gynecology

## 2013-07-27 ENCOUNTER — Ambulatory Visit (INDEPENDENT_AMBULATORY_CARE_PROVIDER_SITE_OTHER): Payer: Medicare Other | Admitting: Obstetrics & Gynecology

## 2013-07-27 ENCOUNTER — Encounter: Payer: Self-pay | Admitting: Obstetrics & Gynecology

## 2013-07-27 VITALS — BP 126/80 | HR 67 | Resp 18 | Ht 65.75 in | Wt 152.0 lb

## 2013-07-27 DIAGNOSIS — Z01419 Encounter for gynecological examination (general) (routine) without abnormal findings: Secondary | ICD-10-CM | POA: Diagnosis not present

## 2013-07-27 MED ORDER — ESTRADIOL 0.5 MG PO TABS: 1.0000 mg | ORAL_TABLET | Freq: Every day | ORAL | Status: DC

## 2013-07-27 MED ORDER — ESTRADIOL 0.5 MG PO TABS: ORAL_TABLET | ORAL | Status: DC

## 2013-07-27 NOTE — Telephone Encounter (Signed)
accident

## 2013-07-27 NOTE — Progress Notes (Signed)
68 y.o. G2P2 MarriedCaucasianF here for annual exam.  Daughter had a daughter 18 months ago.  Patient getting to take care of granddaughter.   Had carpel tunnel release on the right.  Has done well since this.    No LMP recorded. Patient has had a hysterectomy.          Sexually active: yes  The current method of family planning is tubal ligation.    Exercising: yes  Walking, treadmill, bike, yoga Smoker:  no  Health Maintenance: Pap:  2011. Neg. NO HR HPV tested History of abnormal Pap:  no MMG:  10/2012 BI-RADS1:Neg. Colonoscopy:  2007 - every 10 years.  BMD:   03/2011, -1.6 TDaP:  08/2005 Screening Labs: PCP, Hb today: PCP, Urine today: PCP   reports that she has never smoked. She has never used smokeless tobacco. She reports that she drinks alcohol. She reports that she does not use illicit drugs.  Past Medical History  Diagnosis Date  . Contact lens/glasses fitting     wears one contact lt eye  . Medical history non-contributory   . Superficial vein thrombosis 1997  . Heterozygous factor V Leiden mutation   . Osteopenia     Past Surgical History  Procedure Laterality Date  . Cesarean section      x2  . Finger surgery  2002    lt long finger mass  . Abdominal hysterectomy    . Eye surgery      rt cataract  . Tubal ligation    . Bartholin gland cyst excision    . Carpal tunnel release Right 12/20/2012    Procedure: CARPAL TUNNEL RELEASE;  Surgeon: Wynonia Sours, MD;  Location: [facility];  Service: Orthopedics;  Laterality: Right;    Current Outpatient Prescriptions  Medication Sig Dispense Refill  . aspirin 81 MG tablet Take 81 mg by mouth daily.      . calcium carbonate (OS-CAL) 600 MG TABS tablet Take 600 mg by mouth 2 (two) times daily with a meal.      . estradiol (ESTRACE) 0.5 MG tablet Take 0.5 mg by mouth daily.       . fish oil-omega-3 fatty acids 1000 MG capsule Take 2 g by mouth daily.      . Flaxseed, Linseed, (FLAX SEED OIL PO) Take by  mouth.      . Multiple Vitamins-Minerals (MULTIVITAMIN WITH MINERALS) tablet Take 1 tablet by mouth daily.      . naproxen sodium (ANAPROX) 220 MG tablet Take 220 mg by mouth 2 (two) times daily with a meal.      . Plant Sterols and Stanols (CHOLESTOFF PO) Take by mouth daily.       . ranitidine (ZANTAC) 150 MG tablet Take 150 mg by mouth as needed.       . vitamin C (ASCORBIC ACID) 500 MG tablet Take 500 mg by mouth daily.      Marland Kitchen HYDROcodone-acetaminophen (NORCO) 5-325 MG per tablet Take 1 tablet by mouth every 6 (six) hours as needed for pain.  30 tablet  0   No current facility-administered medications for this visit.    Family History  Problem Relation Age of Onset  . Breast cancer Mother   . Heart disease Mother   . Hyperlipidemia Mother   . Thyroid disease Mother   . Osteoporosis Mother   . Dementia Mother   . Diabetes Father   . Heart disease Father   . Cancer Father  prostate  . Cancer Brother     Eye    ROS:  Pertinent items are noted in HPI.  Otherwise, a comprehensive ROS was negative.  Exam:   BP 126/80  Pulse 67  Resp 18  Ht 5' 5.75" (1.67 m)  Wt 152 lb (68.947 kg)  BMI 24.72 kg/m2  Weight change: +3#   Height: 5' 5.75" (167 cm)  Ht Readings from Last 3 Encounters:  07/27/13 5' 5.75" (1.67 m)  12/20/12 5\' 6"  (1.676 m)  12/20/12 5\' 6"  (1.676 m)    General appearance: alert, cooperative and appears stated age Head: Normocephalic, without obvious abnormality, atraumatic Neck: no adenopathy, supple, symmetrical, trachea midline and thyroid normal to inspection and palpation Lungs: clear to auscultation bilaterally Breasts: normal appearance, no masses or tenderness Heart: regular rate and rhythm Abdomen: soft, non-tender; bowel sounds normal; no masses,  no organomegaly Extremities: extremities normal, atraumatic, no cyanosis or edema Skin: Skin color, texture, turgor normal. No rashes or lesions Lymph nodes: Cervical, supraclavicular, and axillary nodes  normal. No abnormal inguinal nodes palpated Neurologic: Grossly normal   Pelvic: External genitalia:  no lesions              Urethra:  normal appearing urethra with no masses, tenderness or lesions              Bartholins and Skenes: normal                 Vagina: normal appearing vagina with normal color and discharge, no lesions              Cervix: absent              Pap taken: no Bimanual Exam:  Uterus:  uterus absent              Adnexa: no mass, fullness, tenderness               Rectovaginal: Confirms               Anus:  normal sphincter tone, no lesions  A:  Well Woman with normal exam H/O TAH/BSO due to bleeding PMP on HRT H/O Factor V Leiden heterozygous (saw Dr. Marin Olp once--HRT ok) H/O depression H/O recurrent UTI H/O left bartholin's marsupialization and current right Bartholin's cyst--non tender.  No treatment recommended at this time.  P:   Mammogram yearly HRT risks discussed including heart attack, breast cancer.  Continue estradiol 1.0 mg 1/2 tab three times weekly.  Rx to pharmacy. pap smear not indicated return annually or prn  An After Visit Summary was printed and given to the patient.

## 2013-07-27 NOTE — Addendum Note (Signed)
Addended by: Megan Salon on: 07/27/2013 12:45 PM   Modules accepted: Orders

## 2013-07-29 ENCOUNTER — Other Ambulatory Visit: Payer: Self-pay | Admitting: Obstetrics & Gynecology

## 2013-07-30 ENCOUNTER — Telehealth: Payer: Self-pay | Admitting: Obstetrics & Gynecology

## 2013-07-30 NOTE — Telephone Encounter (Signed)
Patient calling re: pharmacy has her RX incorrectly. She uses Estrace brand only and splits a 1 MG tablet to help lower costs.  [facility]

## 2013-07-30 NOTE — Telephone Encounter (Signed)
Called pharmacy and Rx was fixed. - Patient notified.

## 2013-08-10 DIAGNOSIS — I789 Disease of capillaries, unspecified: Secondary | ICD-10-CM | POA: Diagnosis not present

## 2013-08-10 DIAGNOSIS — D239 Other benign neoplasm of skin, unspecified: Secondary | ICD-10-CM | POA: Diagnosis not present

## 2013-08-10 DIAGNOSIS — D1801 Hemangioma of skin and subcutaneous tissue: Secondary | ICD-10-CM | POA: Diagnosis not present

## 2013-08-10 DIAGNOSIS — L821 Other seborrheic keratosis: Secondary | ICD-10-CM | POA: Diagnosis not present

## 2013-08-10 DIAGNOSIS — Z809 Family history of malignant neoplasm, unspecified: Secondary | ICD-10-CM | POA: Diagnosis not present

## 2013-08-10 DIAGNOSIS — L219 Seborrheic dermatitis, unspecified: Secondary | ICD-10-CM | POA: Diagnosis not present

## 2013-08-15 DIAGNOSIS — J209 Acute bronchitis, unspecified: Secondary | ICD-10-CM | POA: Diagnosis not present

## 2013-08-15 DIAGNOSIS — R059 Cough, unspecified: Secondary | ICD-10-CM | POA: Diagnosis not present

## 2013-08-15 DIAGNOSIS — R05 Cough: Secondary | ICD-10-CM | POA: Diagnosis not present

## 2013-09-27 ENCOUNTER — Other Ambulatory Visit: Payer: Self-pay

## 2013-09-27 DIAGNOSIS — Z1231 Encounter for screening mammogram for malignant neoplasm of breast: Secondary | ICD-10-CM

## 2013-11-02 ENCOUNTER — Ambulatory Visit
Admission: RE | Admit: 2013-11-02 | Discharge: 2013-11-02 | Disposition: A | Discharge status: Home / Self Care | Payer: Medicare Other | Source: Ambulatory Visit

## 2013-11-02 DIAGNOSIS — Z1231 Encounter for screening mammogram for malignant neoplasm of breast: Secondary | ICD-10-CM | POA: Diagnosis not present

## 2013-12-12 DIAGNOSIS — M6530 Trigger finger, unspecified finger: Secondary | ICD-10-CM | POA: Diagnosis not present

## 2014-01-23 DIAGNOSIS — M6530 Trigger finger, unspecified finger: Secondary | ICD-10-CM | POA: Diagnosis not present

## 2014-03-04 ENCOUNTER — Ambulatory Visit (INDEPENDENT_AMBULATORY_CARE_PROVIDER_SITE_OTHER): Payer: Medicare Other | Admitting: Obstetrics & Gynecology

## 2014-03-04 ENCOUNTER — Telehealth: Payer: Self-pay | Admitting: Obstetrics & Gynecology

## 2014-03-04 VITALS — BP 122/80 | HR 68 | Temp 98.0°F | Resp 16 | Wt 152.6 lb

## 2014-03-04 DIAGNOSIS — N751 Abscess of Bartholin's gland: Secondary | ICD-10-CM | POA: Diagnosis not present

## 2014-03-04 MED ORDER — CEPHALEXIN 500 MG PO CAPS: 500.0000 mg | ORAL_CAPSULE | Freq: Four times a day (QID) | ORAL | Status: DC

## 2014-03-04 NOTE — Telephone Encounter (Signed)
Patient needs a 1 week recheck appointment with [facility]. No appointments available.

## 2014-03-04 NOTE — Telephone Encounter (Signed)
Spoke with patient. "I have always had bartholin cysts but have not had a flare up with one in like four years. Last Wednesday afternoon one started swelling on my right labia." States that she cyst is around the size of a walnut and it slightly painful. Area is reddened. States that on Saturday there was a "discharge from it that did not have an odor or color which is unusual. It is all unusual. It is so large but not that painful. I am just worried." Patient would like to come in to see Dr.Miller. Appointment scheduled for today at 1:30pm with Dr.Miller. Patient is agreeable to date and time.  Routing to provider for final review. Patient agreeable to disposition. Will close encounter

## 2014-03-04 NOTE — Progress Notes (Signed)
Subjective:     Patient ID: [name], female   DOB: [birth year], 68 y.o.   MRN: [id number]  HPI 68 yo G2P2 MWF here with hx of recurrent Bartholin's cyst abscesses.  Has undergone Marsupialization of left gland in past.  Has not had an abscess in several years--not since I've been seeing her which is at least four + years.  Cyst on left started on Wed.  Pt reports it is not that tender, which is unususal for her but she is afraid to wait any longer for it to be drained.  Has had this multiple times.  Understands procedure.  Consent obtained.  Denies fever, chills.  Review of Systems  All other systems reviewed and are negative.      Objective:   Physical Exam  Constitutional: She is oriented to person, place, and time. She appears well-developed and well-nourished.  Genitourinary: Vagina normal.    There is tenderness (mild) and lesion on the right labia. There is no rash or injury on the right labia. There is no rash, tenderness, lesion or injury on the left labia.  Neurological: She is alert and oriented to person, place, and time.  Skin: Skin is warm and dry.  Psychiatric: She has a normal mood and affect.   Lesion on right cleansed with Betadine x 3.  1cc 1% Lidocaine used to anesthatize lesion.  Using #11 blade, incision opened and drained.  Clear fluid noted at first but them purulent drainage noted.  Culture obtained.  Gland milked.  Word catheter placed without difficult and placed in vagina for comfort.  Pt tolerated procedure very well.    Assessment:     Right Bartholin's abscess, s/p I&D     Plan:     Word catheter placed.  As abscess was not significantly tender, feel observation will be all that is needed.   Keflex 500mg  qid x 7 days  Return 2 weeks for catheter removal  Wound culture pending

## 2014-03-04 NOTE — Telephone Encounter (Signed)
Patient has a bartholin's cyst that is "acting up" and she would like to see Dr.Miller.

## 2014-03-05 NOTE — Telephone Encounter (Signed)
Patient has word catheter in place. Needs follow up exam. Scheduled Follow up bartholins cyst (time/date per SY) pt aware surgery date time subject to change. She is agreeable. She will return call with any changes in status. Routing to provider for final review. Patient agreeable to disposition. Will close encounter

## 2014-03-07 LAB — WOUND CULTURE: GRAM STAIN: NONE SEEN

## 2014-03-08 ENCOUNTER — Encounter: Payer: Self-pay | Admitting: Obstetrics & Gynecology

## 2014-03-08 DIAGNOSIS — N751 Abscess of Bartholin's gland: Secondary | ICD-10-CM | POA: Insufficient documentation

## 2014-03-11 ENCOUNTER — Encounter: Payer: Self-pay | Admitting: Obstetrics & Gynecology

## 2014-03-12 ENCOUNTER — Ambulatory Visit (INDEPENDENT_AMBULATORY_CARE_PROVIDER_SITE_OTHER): Payer: Medicare Other | Admitting: Obstetrics & Gynecology

## 2014-03-12 ENCOUNTER — Encounter: Payer: Self-pay | Admitting: Obstetrics & Gynecology

## 2014-03-12 VITALS — BP 118/66 | HR 60 | Resp 16 | Ht 65.75 in | Wt 153.0 lb

## 2014-03-12 DIAGNOSIS — N751 Abscess of Bartholin's gland: Secondary | ICD-10-CM

## 2014-03-12 NOTE — Progress Notes (Signed)
Subjective:     Patient ID: [name], female   DOB: May 10, 1946, 68 y.o.   MRN: [id number]  HPI 68 yo G2P2 MWF here for follow up after undergoing I&D of right Bartholin's abscess 03/04/14.  No pain.  Finished antibiotics.  Aware of wound culture results.  Feels good.  Review of Systems     Objective:   Physical Exam  Constitutional: She is oriented to person, place, and time. She appears well-developed.  Genitourinary:     Neurological: She is alert and oriented to person, place, and time.  Skin: Skin is warm and dry.  Psychiatric: She has a normal mood and affect.   Word catheter removed without difficulty.  Discarded.    Assessment:     Recheck after I&D of bartholin's abscess on 03/04/14. Doing well.    Plan:     Pt will call with any new issues.  May resume normal activity.

## 2014-03-14 DIAGNOSIS — M8580 Other specified disorders of bone density and structure, unspecified site: Secondary | ICD-10-CM | POA: Diagnosis not present

## 2014-03-14 DIAGNOSIS — E780 Pure hypercholesterolemia: Secondary | ICD-10-CM | POA: Diagnosis not present

## 2014-03-14 DIAGNOSIS — D6851 Activated protein C resistance: Secondary | ICD-10-CM | POA: Diagnosis not present

## 2014-03-14 DIAGNOSIS — Z23 Encounter for immunization: Secondary | ICD-10-CM | POA: Diagnosis not present

## 2014-03-14 DIAGNOSIS — M859 Disorder of bone density and structure, unspecified: Secondary | ICD-10-CM | POA: Diagnosis not present

## 2014-03-14 DIAGNOSIS — Z0000 Encounter for general adult medical examination without abnormal findings: Secondary | ICD-10-CM | POA: Diagnosis not present

## 2014-03-14 DIAGNOSIS — Z131 Encounter for screening for diabetes mellitus: Secondary | ICD-10-CM | POA: Diagnosis not present

## 2014-03-21 DIAGNOSIS — H40013 Open angle with borderline findings, low risk, bilateral: Secondary | ICD-10-CM | POA: Diagnosis not present

## 2014-03-21 DIAGNOSIS — H2512 Age-related nuclear cataract, left eye: Secondary | ICD-10-CM | POA: Diagnosis not present

## 2014-03-21 DIAGNOSIS — H25012 Cortical age-related cataract, left eye: Secondary | ICD-10-CM | POA: Diagnosis not present

## 2014-03-21 DIAGNOSIS — Z961 Presence of intraocular lens: Secondary | ICD-10-CM | POA: Diagnosis not present

## 2014-04-15 DIAGNOSIS — R05 Cough: Secondary | ICD-10-CM | POA: Diagnosis not present

## 2014-06-05 DIAGNOSIS — H40013 Open angle with borderline findings, low risk, bilateral: Secondary | ICD-10-CM | POA: Diagnosis not present

## 2014-07-19 DIAGNOSIS — J40 Bronchitis, not specified as acute or chronic: Secondary | ICD-10-CM | POA: Diagnosis not present

## 2014-07-24 DIAGNOSIS — M151 Heberden's nodes (with arthropathy): Secondary | ICD-10-CM | POA: Diagnosis not present

## 2014-07-24 DIAGNOSIS — D2112 Benign neoplasm of connective and other soft tissue of left upper limb, including shoulder: Secondary | ICD-10-CM | POA: Diagnosis not present

## 2014-07-25 ENCOUNTER — Other Ambulatory Visit: Payer: Self-pay | Admitting: Orthopedic Surgery

## 2014-07-30 ENCOUNTER — Encounter: Payer: Self-pay | Admitting: Obstetrics & Gynecology

## 2014-07-30 ENCOUNTER — Ambulatory Visit (INDEPENDENT_AMBULATORY_CARE_PROVIDER_SITE_OTHER): Payer: Medicare Other | Admitting: Obstetrics & Gynecology

## 2014-07-30 VITALS — BP 118/82 | HR 64 | Resp 16 | Ht 65.75 in | Wt 148.2 lb

## 2014-07-30 DIAGNOSIS — Z01419 Encounter for gynecological examination (general) (routine) without abnormal findings: Secondary | ICD-10-CM

## 2014-07-30 DIAGNOSIS — Z0000 Encounter for general adult medical examination without abnormal findings: Secondary | ICD-10-CM | POA: Diagnosis not present

## 2014-07-30 DIAGNOSIS — Z124 Encounter for screening for malignant neoplasm of cervix: Secondary | ICD-10-CM

## 2014-07-30 LAB — POCT URINALYSIS DIPSTICK
Bilirubin, UA: NEGATIVE
Blood, UA: NEGATIVE
Glucose, UA: NEGATIVE
Ketones, UA: NEGATIVE
Leukocytes, UA: NEGATIVE
NITRITE UA: NEGATIVE
PROTEIN UA: NEGATIVE
Urobilinogen, UA: NEGATIVE
pH, UA: 5

## 2014-07-30 MED ORDER — ESTRADIOL 1 MG PO TABS: 1.0000 mg | ORAL_TABLET | Freq: Every day | ORAL | Status: DC

## 2014-07-30 NOTE — Progress Notes (Signed)
69 y.o. G2P2 MarriedCaucasianF here for annual exam.  Doing well.  Had Bartholin's abscess last fall and is now feeling it again.  It is not tender.  No vaginal bleeding.  Had bronchitis over the winter.   Dr. Harrington Challenger is PCP:  Annual physical exam was 11/16.  Cholesterol is up to 230.  Declines treatment at this time.  She is taking a supplement for this to see if will help.   No LMP recorded. Patient has had a hysterectomy.          Sexually active: Yes.    The current method of family planning is status post hysterectomy.    Exercising: Yes.    walking, stat. bike, and yoga Smoker:  no  Health Maintenance: Pap:  11/19/09 WNL History of abnormal Pap:  no MMG:  11/02/13 3D-normal Colonoscopy:  2007-repeat in 10 years BMD:   04/07/11-normal spine, 0.1, -1.6.  Repeat next year is planned. TDaP:  4/07 Screening Labs: PCP, Hb today: PCP, Urine today: negative   reports that she has never smoked. She has never used smokeless tobacco. She reports that she drinks about 0.6 oz of alcohol per week. She reports that she does not use illicit drugs.  Past Medical History  Diagnosis Date  . Contact lens/glasses fitting     wears one contact lt eye  . Superficial vein thrombosis 1997  . Heterozygous factor V Leiden mutation   . Osteopenia     Past Surgical History  Procedure Laterality Date  . Cesarean section  1973, 1977    x2  . Finger surgery  2002    lt long finger mass  . Total abdominal hysterectomy w/ bilateral salpingoophorectomy  1997  . Cataract extraction Right 2009       . Tubal ligation  1977  . Ganglion cyst excision Left 2/02  . Carpal tunnel release Right 12/20/2012    Procedure: CARPAL TUNNEL RELEASE;  Surgeon: Wynonia Sours, MD;  Location: [facility];  Service: Orthopedics;  Laterality: Right;  . Bartholin cyst marsupialization      Current Outpatient Prescriptions  Medication Sig Dispense Refill  . aspirin 81 MG tablet Take 81 mg by mouth daily.    .  calcium carbonate (OS-CAL) 600 MG TABS tablet Take 600 mg by mouth 2 (two) times daily with a meal.    . estradiol (ESTRACE) 1 MG tablet Take 1 mg by mouth. 1/2 tablet daily    . fish oil-omega-3 fatty acids 1000 MG capsule Take 2 g by mouth daily.    . Flaxseed, Linseed, (FLAX SEED OIL PO) Take by mouth.    Marland Kitchen HYDROcodone-acetaminophen (NORCO) 5-325 MG per tablet Take 1 tablet by mouth every 6 (six) hours as needed for pain. 30 tablet 0  . Multiple Vitamins-Minerals (MULTIVITAMIN WITH MINERALS) tablet Take 1 tablet by mouth daily.    . naproxen sodium (ANAPROX) 220 MG tablet Take 220 mg by mouth. Once daily    . Plant Sterols and Stanols (CHOLESTOFF PO) Take by mouth daily.     . ranitidine (ZANTAC) 150 MG tablet Take 150 mg by mouth as needed.     . vitamin C (ASCORBIC ACID) 500 MG tablet Take 500 mg by mouth daily.     No current facility-administered medications for this visit.    Family History  Problem Relation Age of Onset  . Breast cancer Mother   . Heart disease Mother   . Hyperlipidemia Mother   . Thyroid disease Mother   .  Osteoporosis Mother   . Dementia Mother   . Diabetes Father   . Heart disease Father   . Cancer Father     prostate  . Cancer Brother     Eye    ROS:  Pertinent items are noted in HPI.  Otherwise, a comprehensive ROS was negative.  Exam:   General appearance: alert, cooperative and appears stated age Head: Normocephalic, without obvious abnormality, atraumatic Neck: no adenopathy, supple, symmetrical, trachea midline and thyroid normal to inspection and palpation Lungs: clear to auscultation bilaterally Breasts: normal appearance, no masses or tenderness Heart: regular rate and rhythm Abdomen: soft, non-tender; bowel sounds normal; no masses,  no organomegaly Extremities: extremities normal, atraumatic, no cyanosis or edema Skin: Skin color, texture, turgor normal. No rashes or lesions Lymph nodes: Cervical, supraclavicular, and axillary nodes  normal. No abnormal inguinal nodes palpated Neurologic: Grossly normal   Pelvic: External genitalia: 2.5cm non tender left Bartholin's cyst              Urethra:  normal appearing urethra with no masses, tenderness or lesions              Bartholins and Skenes: normal                 Vagina: normal appearing vagina with normal color and discharge, no lesions              Cervix: absent              Pap taken: No. Bimanual Exam:  Uterus:  uterus absent              Adnexa: no mass, fullness, tenderness               Rectovaginal: Confirms               Anus:  normal sphincter tone, no lesions  Chaperone was present for exam.  A:  Well Woman with normal exam H/O TAH/BSO due to bleeding  PMP on HRT H/O Factor V Leiden heterozygous (saw Dr. Marin Olp once--HRT ok) H/O depression H/O left bartholin's marsupialization and current right Bartholin's cyst--non tender. D/w pt treatment via Marsupialization vs excision.  Pt would like to monitor.  If has another abscess, she thinks she will proceed with treatment.  P: Mammogram yearly HRT risks discussed including heart attack, breast cancer. Continue estradiol 1.0 mg 1/2 tab three times weekly. Rx to pharmacy. BMD, colonoscopy, and TDap next year pap smear not indicated return annually or prn

## 2014-08-01 DIAGNOSIS — R0981 Nasal congestion: Secondary | ICD-10-CM | POA: Diagnosis not present

## 2014-08-01 DIAGNOSIS — R05 Cough: Secondary | ICD-10-CM | POA: Diagnosis not present

## 2014-08-05 ENCOUNTER — Ambulatory Visit: Payer: Self-pay | Admitting: Obstetrics & Gynecology

## 2014-08-09 ENCOUNTER — Encounter (HOSPITAL_BASED_OUTPATIENT_CLINIC_OR_DEPARTMENT_OTHER): Payer: Self-pay | Admitting: *Deleted

## 2014-08-09 NOTE — Progress Notes (Signed)
Pt was here 2014 for ctr-no problems no labs needed

## 2014-08-13 ENCOUNTER — Telehealth: Payer: Self-pay | Admitting: Obstetrics & Gynecology

## 2014-08-13 ENCOUNTER — Ambulatory Visit (INDEPENDENT_AMBULATORY_CARE_PROVIDER_SITE_OTHER): Payer: Medicare Other | Admitting: Obstetrics & Gynecology

## 2014-08-13 VITALS — BP 132/80 | HR 68 | Temp 98.3°F | Resp 16 | Wt 148.6 lb

## 2014-08-13 DIAGNOSIS — N751 Abscess of Bartholin's gland: Secondary | ICD-10-CM

## 2014-08-13 DIAGNOSIS — N750 Cyst of Bartholin's gland: Secondary | ICD-10-CM

## 2014-08-13 MED ORDER — CEPHALEXIN 500 MG PO CAPS: 500.0000 mg | ORAL_CAPSULE | Freq: Four times a day (QID) | ORAL | Status: DC

## 2014-08-13 NOTE — Telephone Encounter (Signed)
Spoke with patient. Patient states that she has a cyst that is continuing to grow in size. "I saw Dr.Miller a couple of weeks ago for my annual and she looked at it then and made some recommendations but I decided to just watch it. Over the last two days it has gotten larger and more tender." States area is the size of a golf ball currently. Advised patient will need to be seen for evaluation with Dr.Miller. Patient is agreeable. Appointment scheduled for today at 2:45 pm. Patient is agreeable to date and time.  Routing to provider for final review. Patient agreeable to disposition. Will close encounter

## 2014-08-13 NOTE — Telephone Encounter (Signed)
Patient calling requesting to see Dr. Sabra Heck for "a bartholin cyst that's not going to get better on it's own."

## 2014-08-13 NOTE — Progress Notes (Signed)
Subjective:     Patient ID: [name], female   DOB: [birth year], 69 y.o.   MRN: [id number]  HPI 69 yo G2P2 MWF here with increased pain/tenderness of right Bartholin's cyst/abscess.  Pt has hx of recurrent Bartholin's cyst abscesses, now on both sides.  She has seen some discharge that was yellowish and greyish, four times during the last several weeks.    Pt has undergone Marsupialization of left gland in past. Right side was I&D'ed in the fall.  So, this will be second time for I&D.  She wants to discuss surgical options as well.  Marsupialization vs excision of gland discussed.  Bleeding, infection, recurrent, bowel injury with excision of gland all disucssed.  Surgical procedure as well as recovery discussed.    Pt states she will need to think about it but feels she will proceed with marsupialization as this is "the beast I know".   Denies fever or chills.  Consent obtained for I&D with possible word catheter placement.  Review of Systems  Genitourinary: Positive for vaginal pain (right sided).  All other systems reviewed and are negative.      Objective:   Physical Exam  Constitutional: She appears well-developed and well-nourished.  Genitourinary: Vagina normal.     Lymphadenopathy:       Right: No inguinal adenopathy present.       Left: No inguinal adenopathy present.  Skin: Skin is warm and dry.  Psychiatric: She has a normal mood and affect.   Procedure:  Skin cleansed with Betadine x 3.  1cc 1% Lidocaine, plain, instilled.  Lesion opened with #11 blade knife.  Large amt of purulent fluid drained.  Abscess milked.  Abscess probed.  No loculations noted.  Word catheter placed without difficulty.  Pt tolerated procedure well.    Assessment:     Recurrent right Bartholin's cyst abscess, I&D today Considering surgical intervention     Plan:     Word catheter placed. Pt will call and let me know what she desires for surgery. Keflex 500mg  qid x 7 days  ~15 minutes  spent with patient >50% of time was in face to face discussion of above, specifically surgical corrections.

## 2014-08-13 NOTE — Progress Notes (Signed)
Marsupialization of right Bartholin gland scheduled for Monday 08-19-14 at 0730 at [place] instruction sheet reviewed with patient and printed copy given. (see scanned copy.)

## 2014-08-14 ENCOUNTER — Encounter (HOSPITAL_BASED_OUTPATIENT_CLINIC_OR_DEPARTMENT_OTHER): Payer: Self-pay | Admitting: *Deleted

## 2014-08-14 ENCOUNTER — Telehealth: Payer: Self-pay | Admitting: Obstetrics & Gynecology

## 2014-08-14 DIAGNOSIS — L821 Other seborrheic keratosis: Secondary | ICD-10-CM | POA: Diagnosis not present

## 2014-08-14 DIAGNOSIS — L304 Erythema intertrigo: Secondary | ICD-10-CM | POA: Diagnosis not present

## 2014-08-14 DIAGNOSIS — L814 Other melanin hyperpigmentation: Secondary | ICD-10-CM | POA: Diagnosis not present

## 2014-08-14 DIAGNOSIS — L988 Other specified disorders of the skin and subcutaneous tissue: Secondary | ICD-10-CM | POA: Diagnosis not present

## 2014-08-14 DIAGNOSIS — D1800 Hemangioma unspecified site: Secondary | ICD-10-CM | POA: Diagnosis not present

## 2014-08-14 DIAGNOSIS — Z808 Family history of malignant neoplasm of other organs or systems: Secondary | ICD-10-CM | POA: Diagnosis not present

## 2014-08-14 DIAGNOSIS — Q279 Congenital malformation of peripheral vascular system, unspecified: Secondary | ICD-10-CM | POA: Diagnosis not present

## 2014-08-14 NOTE — Telephone Encounter (Signed)
Spoke with patient. Advised of benefit quote received for the surgeon portion of her surgery. Patient agreeable. Will mail check out tomorrow morning.

## 2014-08-14 NOTE — Telephone Encounter (Signed)
Returning a call to Sabrina. °

## 2014-08-14 NOTE — Telephone Encounter (Signed)
Left message for patient to call back. Need to go over benefits for surgery.

## 2014-08-15 ENCOUNTER — Encounter (HOSPITAL_BASED_OUTPATIENT_CLINIC_OR_DEPARTMENT_OTHER): Payer: Self-pay | Admitting: Orthopedic Surgery

## 2014-08-15 ENCOUNTER — Ambulatory Visit (HOSPITAL_BASED_OUTPATIENT_CLINIC_OR_DEPARTMENT_OTHER): Payer: Medicare Other | Admitting: Anesthesiology

## 2014-08-15 ENCOUNTER — Encounter (HOSPITAL_BASED_OUTPATIENT_CLINIC_OR_DEPARTMENT_OTHER)
Admission: RE | Disposition: A | Discharge status: Home / Self Care | Payer: Self-pay | Source: Ambulatory Visit | Attending: Orthopedic Surgery

## 2014-08-15 DIAGNOSIS — Z86718 Personal history of other venous thrombosis and embolism: Secondary | ICD-10-CM | POA: Diagnosis not present

## 2014-08-15 DIAGNOSIS — Z881 Allergy status to other antibiotic agents status: Secondary | ICD-10-CM

## 2014-08-15 DIAGNOSIS — D6851 Activated protein C resistance: Secondary | ICD-10-CM

## 2014-08-15 DIAGNOSIS — M8580 Other specified disorders of bone density and structure, unspecified site: Secondary | ICD-10-CM | POA: Diagnosis not present

## 2014-08-15 DIAGNOSIS — Z90710 Acquired absence of both cervix and uterus: Secondary | ICD-10-CM | POA: Insufficient documentation

## 2014-08-15 DIAGNOSIS — M19041 Primary osteoarthritis, right hand: Secondary | ICD-10-CM | POA: Diagnosis not present

## 2014-08-15 DIAGNOSIS — M25842 Other specified joint disorders, left hand: Secondary | ICD-10-CM | POA: Diagnosis not present

## 2014-08-15 DIAGNOSIS — Z888 Allergy status to other drugs, medicaments and biological substances status: Secondary | ICD-10-CM

## 2014-08-15 DIAGNOSIS — Z7982 Long term (current) use of aspirin: Secondary | ICD-10-CM

## 2014-08-15 DIAGNOSIS — M71342 Other bursal cyst, left hand: Principal | ICD-10-CM

## 2014-08-15 DIAGNOSIS — M67442 Ganglion, left hand: Secondary | ICD-10-CM | POA: Diagnosis not present

## 2014-08-15 DIAGNOSIS — Z9851 Tubal ligation status: Secondary | ICD-10-CM

## 2014-08-15 DIAGNOSIS — M151 Heberden's nodes (with arthropathy): Secondary | ICD-10-CM | POA: Diagnosis not present

## 2014-08-15 DIAGNOSIS — D2112 Benign neoplasm of connective and other soft tissue of left upper limb, including shoulder: Secondary | ICD-10-CM | POA: Diagnosis not present

## 2014-08-15 HISTORY — PX: EAR CYST EXCISION: SHX22

## 2014-08-15 LAB — POCT HEMOGLOBIN-HEMACUE: HEMOGLOBIN: 14.2 g/dL (ref 12.0–15.0)

## 2014-08-15 SURGERY — CYST REMOVAL
Anesthesia: Monitor Anesthesia Care | Site: Hand | Laterality: Left

## 2014-08-15 MED ORDER — CHLORHEXIDINE GLUCONATE 4 % EX LIQD: 60.0000 mL | Freq: Once | CUTANEOUS | Status: DC

## 2014-08-15 MED ORDER — BUPIVACAINE HCL (PF) 0.25 % IJ SOLN
INTRAMUSCULAR | Status: DC | PRN
Start: 2014-08-15 — End: 2014-08-15
  Administered 2014-08-15: 7 mL

## 2014-08-15 MED ORDER — FENTANYL CITRATE 0.05 MG/ML IJ SOLN
INTRAMUSCULAR | Status: DC | PRN
  Administered 2014-08-15: 100 ug via INTRAVENOUS

## 2014-08-15 MED ORDER — PROPOFOL 10 MG/ML IV BOLUS
INTRAVENOUS | Status: DC | PRN
  Administered 2014-08-15: 40 mg via INTRAVENOUS

## 2014-08-15 MED ORDER — MIDAZOLAM HCL 5 MG/5ML IJ SOLN
INTRAMUSCULAR | Status: DC | PRN
  Administered 2014-08-15: 2 mg via INTRAVENOUS

## 2014-08-15 MED ORDER — LACTATED RINGERS IV SOLN
INTRAVENOUS | Status: DC
  Administered 2014-08-15: 10:00:00 via INTRAVENOUS

## 2014-08-15 MED ORDER — HYDROCODONE-ACETAMINOPHEN 5-325 MG PO TABS: 1.0000 | ORAL_TABLET | Freq: Four times a day (QID) | ORAL | Status: DC | PRN

## 2014-08-15 MED ORDER — CEFAZOLIN SODIUM-DEXTROSE 2-3 GM-% IV SOLR
INTRAVENOUS | Status: AC
  Filled 2014-08-15: qty 50

## 2014-08-15 MED ORDER — FENTANYL CITRATE 0.05 MG/ML IJ SOLN: 50.0000 ug | INTRAMUSCULAR | Status: DC | PRN

## 2014-08-15 MED ORDER — CEFAZOLIN SODIUM-DEXTROSE 2-3 GM-% IV SOLR
2.0000 g | INTRAVENOUS | Status: AC
  Administered 2014-08-15: 2 g via INTRAVENOUS

## 2014-08-15 MED ORDER — CEFAZOLIN SODIUM-DEXTROSE 2-3 GM-% IV SOLR: 2.0000 g | INTRAVENOUS | Status: DC

## 2014-08-15 MED ORDER — MIDAZOLAM HCL 2 MG/2ML IJ SOLN
INTRAMUSCULAR | Status: AC
  Filled 2014-08-15: qty 2

## 2014-08-15 MED ORDER — HYDROMORPHONE HCL 1 MG/ML IJ SOLN: 0.2500 mg | INTRAMUSCULAR | Status: DC | PRN

## 2014-08-15 MED ORDER — FENTANYL CITRATE 0.05 MG/ML IJ SOLN
INTRAMUSCULAR | Status: AC
  Filled 2014-08-15: qty 2

## 2014-08-15 MED ORDER — MIDAZOLAM HCL 2 MG/2ML IJ SOLN: 1.0000 mg | INTRAMUSCULAR | Status: DC | PRN

## 2014-08-15 MED ORDER — ONDANSETRON HCL 4 MG/2ML IJ SOLN
INTRAMUSCULAR | Status: DC | PRN
  Administered 2014-08-15: 4 mg via INTRAVENOUS

## 2014-08-15 SURGICAL SUPPLY — 47 items
BANDAGE COBAN STERILE 2 (GAUZE/BANDAGES/DRESSINGS) IMPLANT
BLADE MINI RND TIP GREEN BEAV (BLADE) IMPLANT
BLADE SURG 15 STRL LF DISP TIS (BLADE) ×1 IMPLANT
BLADE SURG 15 STRL SS (BLADE) ×2
BNDG COHESIVE 1X5 TAN STRL LF (GAUZE/BANDAGES/DRESSINGS) IMPLANT
BNDG COHESIVE 3X5 TAN STRL LF (GAUZE/BANDAGES/DRESSINGS) IMPLANT
BNDG ESMARK 4X9 LF (GAUZE/BANDAGES/DRESSINGS) IMPLANT
BNDG GAUZE ELAST 4 BULKY (GAUZE/BANDAGES/DRESSINGS) IMPLANT
CHLORAPREP W/TINT 26ML (MISCELLANEOUS) ×3 IMPLANT
CORDS BIPOLAR (ELECTRODE) ×3 IMPLANT
COVER BACK TABLE 60X90IN (DRAPES) ×3 IMPLANT
COVER MAYO STAND STRL (DRAPES) ×3 IMPLANT
CUFF TOURNIQUET SINGLE 18IN (TOURNIQUET CUFF) ×3 IMPLANT
DECANTER SPIKE VIAL GLASS SM (MISCELLANEOUS) IMPLANT
DRAIN PENROSE 1/2X12 LTX STRL (WOUND CARE) IMPLANT
DRAPE EXTREMITY T 121X128X90 (DRAPE) ×3 IMPLANT
DRAPE SURG 17X23 STRL (DRAPES) ×3 IMPLANT
GAUZE SPONGE 4X4 12PLY STRL (GAUZE/BANDAGES/DRESSINGS) ×3 IMPLANT
GAUZE XEROFORM 1X8 LF (GAUZE/BANDAGES/DRESSINGS) ×3 IMPLANT
GLOVE BIO SURGEON STRL SZ 6.5 (GLOVE) ×4 IMPLANT
GLOVE BIO SURGEONS STRL SZ 6.5 (GLOVE) ×2
GLOVE BIOGEL PI IND STRL 7.0 (GLOVE) ×3 IMPLANT
GLOVE BIOGEL PI IND STRL 8.5 (GLOVE) ×1 IMPLANT
GLOVE BIOGEL PI INDICATOR 7.0 (GLOVE) ×6
GLOVE BIOGEL PI INDICATOR 8.5 (GLOVE) ×2
GLOVE SURG ORTHO 8.0 STRL STRW (GLOVE) ×3 IMPLANT
GOWN STRL REUS W/ TWL LRG LVL3 (GOWN DISPOSABLE) ×2 IMPLANT
GOWN STRL REUS W/TWL LRG LVL3 (GOWN DISPOSABLE) ×4
GOWN STRL REUS W/TWL XL LVL3 (GOWN DISPOSABLE) ×3 IMPLANT
NEEDLE PRECISIONGLIDE 27X1.5 (NEEDLE) ×3 IMPLANT
NS IRRIG 1000ML POUR BTL (IV SOLUTION) ×3 IMPLANT
PACK BASIN DAY SURGERY FS (CUSTOM PROCEDURE TRAY) ×3 IMPLANT
PAD CAST 3X4 CTTN HI CHSV (CAST SUPPLIES) IMPLANT
PADDING CAST ABS 3INX4YD NS (CAST SUPPLIES)
PADDING CAST ABS 4INX4YD NS (CAST SUPPLIES) ×2
PADDING CAST ABS COTTON 3X4 (CAST SUPPLIES) IMPLANT
PADDING CAST ABS COTTON 4X4 ST (CAST SUPPLIES) ×1 IMPLANT
PADDING CAST COTTON 3X4 STRL (CAST SUPPLIES)
SPLINT FINGER 3.25 BULB 911905 (SOFTGOODS) ×3 IMPLANT
SPLINT PLASTER CAST XFAST 3X15 (CAST SUPPLIES) IMPLANT
SPLINT PLASTER XTRA FASTSET 3X (CAST SUPPLIES)
STOCKINETTE 4X48 STRL (DRAPES) ×3 IMPLANT
SUT VIC AB 4-0 P2 18 (SUTURE) IMPLANT
SYR BULB 3OZ (MISCELLANEOUS) ×3 IMPLANT
SYR CONTROL 10ML LL (SYRINGE) ×3 IMPLANT
TOWEL OR 17X24 6PK STRL BLUE (TOWEL DISPOSABLE) ×6 IMPLANT
UNDERPAD 30X30 INCONTINENT (UNDERPADS AND DIAPERS) ×3 IMPLANT

## 2014-08-15 NOTE — Anesthesia Preprocedure Evaluation (Signed)
Anesthesia Evaluation  Patient identified by MRN, date of birth, ID band Patient awake    Reviewed: Allergy & Precautions, H&P , NPO status , Patient's Chart, lab work & pertinent test results  History of Anesthesia Complications (+) PONV  Airway Mallampati: II  TM Distance: >3 FB Neck ROM: Full    Dental no notable dental hx. (+) Teeth Intact, Dental Advisory Given   Pulmonary asthma ,  breath sounds clear to auscultation  Pulmonary exam normal       Cardiovascular negative cardio ROS  Rhythm:Regular Rate:Normal     Neuro/Psych negative neurological ROS  negative psych ROS   GI/Hepatic negative GI ROS, Neg liver ROS,   Endo/Other  negative endocrine ROS  Renal/GU negative Renal ROS  negative genitourinary   Musculoskeletal  (+) Arthritis -, Osteoarthritis,    Abdominal   Peds  Hematology  (+) Blood dyscrasia, , Factor V Leiden deficiency   Anesthesia Other Findings   Reproductive/Obstetrics negative OB ROS                             Anesthesia Physical Anesthesia Plan  ASA: II  Anesthesia Plan: MAC and Bier Block   Post-op Pain Management:    Induction: Intravenous  Airway Management Planned: Simple Face Mask  Additional Equipment:   Intra-op Plan:   Post-operative Plan:   Informed Consent: I have reviewed the patients History and Physical, chart, labs and discussed the procedure including the risks, benefits and alternatives for the proposed anesthesia with the patient or authorized representative who has indicated his/her understanding and acceptance.   Dental advisory given  Plan Discussed with: CRNA  Anesthesia Plan Comments:         Anesthesia Quick Evaluation

## 2014-08-15 NOTE — Anesthesia Postprocedure Evaluation (Signed)
  Anesthesia Post-op Note  Patient: [name]  Procedure(s) Performed: Procedure(s): LEFT THUMB DEBRIDEMENT INTERPHALANGEAL JOINT LEFT THUMB REMOVAL MUCOID CYST  (Left)  Patient Location: PACU  Anesthesia Type: MAC w/ Bier block  Level of Consciousness: awake and alert   Airway and Oxygen Therapy: Patient Spontanous Breathing  Post-op Pain: none  Post-op Assessment: Post-op Vital signs reviewed, Patient's Cardiovascular Status Stable and Respiratory Function Stable  Post-op Vital Signs: Reviewed  Filed Vitals:   08/15/14 1153  BP: 139/73  Pulse: 55  Temp: 36.6 C  Resp: 16    Complications: No apparent anesthesia complications

## 2014-08-15 NOTE — H&P (Signed)
[name] is a 69 yo female. She is complaining of a mass over the IP joint of her left thumb.  This has been present for the past three months. She recalls no history of injury.  She complains of a dull aching type pain.  She has not had any treatment nor taken anything for this.  She has no history of diabetes, she does have history of arthritis, no history of gout.   ALLERGIES:  Floxin  MEDICATIONS:  Aspirin, Estrace, vitamins.  SURGICAL HISTORY:   C-sections, hysterectomy, cataract surgery, ganglion cyst excised, carpal tunnel on her right side. 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  FAMILY MEDICAL HISTORY:  Positive for diabetes, otherwise negative.  SOCIAL HISTORY:   She does not smoke, drinks socially, married.   REVIEW OF SYSTEMS:   Positive for contacts, otherwise negative 14 points. [name] is an 69 y.o. female.   Chief Complaint: mucoid tumor left thumb HPI: see above  Past Medical History  Diagnosis Date  . Contact lens/glasses fitting     wears one contact lt eye  . Superficial vein thrombosis 1997  . Heterozygous factor V Leiden mutation   . Osteopenia   . PONV (postoperative nausea and vomiting)     Past Surgical History  Procedure Laterality Date  . Cesarean section  1973, 1977    x2  . Finger surgery  2002    lt long finger mass  . Total abdominal hysterectomy w/ bilateral salpingoophorectomy  1997  . Cataract extraction Right 2009       . Tubal ligation  1977  . Ganglion cyst excision Left 2/02  . Carpal tunnel release Right 12/20/2012    Procedure: CARPAL TUNNEL RELEASE;  Surgeon: Wynonia Sours, MD;  Location: [place];  Service: Orthopedics;  Laterality: Right;  . Bartholin cyst marsupialization      Family History  Problem Relation Age of Onset  . Breast cancer Mother   . Heart disease Mother   . Hyperlipidemia Mother   . Thyroid disease Mother   . Osteoporosis Mother   . Dementia Mother   . Diabetes Father    . Heart disease Father   . Cancer Father     prostate  . Cancer Brother     Eye   Social History:  reports that she has never smoked. She has never used smokeless tobacco. She reports that she drinks about 0.6 oz of alcohol per week. She reports that she does not use illicit drugs.  Allergies:  Allergies  Allergen Reactions  . Ofloxacin Itching and Swelling    Medications Prior to Admission  Medication Sig Dispense Refill  . albuterol (PROVENTIL HFA;VENTOLIN HFA) 108 (90 BASE) MCG/ACT inhaler Inhale into the lungs every 6 (six) hours as needed for wheezing or shortness of breath.    . montelukast (SINGULAIR) 10 MG tablet Take 10 mg by mouth at bedtime.    Marland Kitchen acetaminophen (TYLENOL) 500 MG tablet Take 500 mg by mouth every 4 (four) hours as needed for mild pain.    Marland Kitchen aspirin 81 MG tablet Take 81 mg by mouth daily.    . calcium carbonate (OS-CAL) 600 MG TABS tablet Take 600 mg by mouth 2 (two) times daily with a meal.    . cephALEXin (KEFLEX) 500 MG capsule Take 1 capsule (500 mg total) by mouth 4 (four) times daily. Take QID for 7 days. 28 capsule 0  . estradiol (ESTRACE) 1 MG tablet Take 1  tablet (1 mg total) by mouth daily. 1/2 tablet daily (Patient taking differently: Take 1 mg by mouth daily. 1/2 tablet 3 x week) 30 tablet 12  . fish oil-omega-3 fatty acids 1000 MG capsule Take 2 g by mouth daily.    . Flaxseed, Linseed, (FLAX SEED OIL PO) Take 1 capsule by mouth daily.     Marland Kitchen ibuprofen (ADVIL,MOTRIN) 200 MG tablet Take 200 mg by mouth every 6 (six) hours as needed for moderate pain.    . Multiple Vitamins-Minerals (MULTIVITAMIN WITH MINERALS) tablet Take 1 tablet by mouth daily.    . naproxen sodium (ANAPROX) 220 MG tablet Take 220 mg by mouth daily.     . Plant Sterols and Stanols (CHOLESTOFF PO) Take by mouth daily.     . ranitidine (ZANTAC) 75 MG tablet Take 75 mg by mouth at bedtime as needed for heartburn.    . vitamin C (ASCORBIC ACID) 500 MG tablet Take 500 mg by mouth  daily.      No results found for this or any previous visit (from the past 48 hour(s)).  No results found.   Pertinent items are noted in HPI.  Height 5' 5.75" (1.67 m), weight 67.132 kg (148 lb).  General appearance: alert, cooperative and appears stated age Head: Normocephalic, without obvious abnormality Neck: no JVD Resp: clear to auscultation bilaterally Cardio: regular rate and rhythm, S1, S2 normal, no murmur, click, rub or gallop GI: soft, non-tender; bowel sounds normal; no masses,  no organomegaly Extremities: mass left thumb Pulses: 2+ and symmetric Skin: Skin color, texture, turgor normal. No rashes or lesions Neurologic: Grossly normal Incision/Wound: na  Assessment/Plan RADIOGRAPHS:   X-rays reveals mild degenerative change at the IP joint of the thumb.   DIAGNOSIS:   Mucoid cyst with degenerative arthritis, IP joint left thumb.  RECOMMENDATIONS/PLAN:   We have discussed the possibility of surgical excision with her.  She has elected not to have anything done to her carpal tunnel syndrome on that side.  She is aware that there is no guarantee with the surgery,  possibility of infection, recurrence, injury to arteries, nerves, tendons, incomplete relief of symptoms and dystrophy,  the possibility of recurrence of the cyst as long as degenerative changes are present in the IP joint.  She would like to proceed.  This is scheduled as an outpatient under regional anesthesia for excision mucoid cyst, debridement IP joint left thumb.  Alyssa Rotondo R 08/15/2014, 9:01 AM

## 2014-08-15 NOTE — Telephone Encounter (Signed)
All surgical instructions reviewed during office visit on 08-13-14. Surgery instruction sheet reviewed and printed copy given to patient.  Routing to provider for final review. Patient agreeable to disposition. Will close encounter

## 2014-08-15 NOTE — Discharge Instructions (Addendum)

## 2014-08-15 NOTE — Op Note (Signed)
Other Dictation: Dictation Number [id number]

## 2014-08-15 NOTE — Anesthesia Procedure Notes (Signed)
Procedure Name: MAC Date/Time: 08/15/2014 10:42 AM Performed by: Melynda Ripple D Pre-anesthesia Checklist: Patient identified, Timeout performed, Emergency Drugs available, Suction available and Patient being monitored Placement Confirmation: positive ETCO2

## 2014-08-15 NOTE — Transfer of Care (Signed)
Immediate Anesthesia Transfer of Care Note  Patient: [name]  Procedure(s) Performed: Procedure(s): LEFT THUMB DEBRIDEMENT INTERPHALANGEAL JOINT LEFT THUMB REMOVAL MUCOID CYST  (Left)  Patient Location: PACU  Anesthesia Type:MAC and Bier block  Level of Consciousness: awake, alert  and oriented  Airway & Oxygen Therapy: Patient Spontanous Breathing and Patient connected to face mask oxygen  Post-op Assessment: Report given to RN and Post -op Vital signs reviewed and stable  Post vital signs: Reviewed and stable  Last Vitals:  Filed Vitals:   08/15/14 0925  BP: 127/85  Pulse: 64  Temp: 36.8 C  Resp: 16    Complications: No apparent anesthesia complications

## 2014-08-15 NOTE — Brief Op Note (Signed)
08/15/2014  11:11 AM  PATIENT:  [name]  69 y.o. female  PRE-OPERATIVE DIAGNOSIS:  DEGENERATIVE JOINT DISEASE LEFT THUMB INTERPHALANGEAL JOINT CYST  POST-OPERATIVE DIAGNOSIS:  MUCOID CYST DEGENERATIVE JOINT DISEASE INTERPHALANGEAL JOINT  PROCEDURE:  Procedure(s): LEFT THUMB DEBRIDEMENT INTERPHALANGEAL JOINT LEFT THUMB REMOVAL MUCOID CYST  (Left)  SURGEON:  Surgeon(s) and Role:    * Daryll Brod, MD - Primary  PHYSICIAN ASSISTANT:   ASSISTANTS: none   ANESTHESIA:   local and regional  EBL:  Total I/O In: 1000 [I.V.:1000] Out: -   BLOOD ADMINISTERED:none  DRAINS: none   LOCAL MEDICATIONS USED:  BUPIVICAINE   SPECIMEN:  No Specimen  DISPOSITION OF SPECIMEN:  N/A  COUNTS:  YES  TOURNIQUET:   Total Tourniquet Time Documented: Forearm (Left) - 21 minutes Total: Forearm (Left) - 21 minutes   DICTATION: .Other Dictation: Dictation Number [id number]  PLAN OF CARE: Discharge to home after PACU  PATIENT DISPOSITION:  PACU - hemodynamically stable.

## 2014-08-16 ENCOUNTER — Encounter (HOSPITAL_COMMUNITY): Payer: Self-pay

## 2014-08-16 ENCOUNTER — Ambulatory Visit (HOSPITAL_COMMUNITY)
Admission: RE | Admit: 2014-08-16 | Discharge: 2014-08-16 | Disposition: A | Discharge status: Home / Self Care | Payer: Medicare Other | Source: Ambulatory Visit | Attending: Orthopedic Surgery | Admitting: Orthopedic Surgery

## 2014-08-16 ENCOUNTER — Encounter (HOSPITAL_COMMUNITY)
Admission: RE | Admit: 2014-08-16 | Discharge: 2014-08-16 | Disposition: A | Discharge status: Home / Self Care | Payer: Medicare Other | Source: Ambulatory Visit | Attending: Obstetrics & Gynecology | Admitting: Obstetrics & Gynecology

## 2014-08-16 ENCOUNTER — Other Ambulatory Visit: Payer: Self-pay

## 2014-08-16 DIAGNOSIS — D6851 Activated protein C resistance: Secondary | ICD-10-CM | POA: Diagnosis not present

## 2014-08-16 DIAGNOSIS — M71342 Other bursal cyst, left hand: Secondary | ICD-10-CM | POA: Diagnosis not present

## 2014-08-16 DIAGNOSIS — Z90710 Acquired absence of both cervix and uterus: Secondary | ICD-10-CM | POA: Diagnosis not present

## 2014-08-16 DIAGNOSIS — Z86718 Personal history of other venous thrombosis and embolism: Secondary | ICD-10-CM | POA: Diagnosis not present

## 2014-08-16 DIAGNOSIS — M8580 Other specified disorders of bone density and structure, unspecified site: Secondary | ICD-10-CM | POA: Insufficient documentation

## 2014-08-16 DIAGNOSIS — Z9851 Tubal ligation status: Secondary | ICD-10-CM | POA: Diagnosis not present

## 2014-08-16 DIAGNOSIS — Z7982 Long term (current) use of aspirin: Secondary | ICD-10-CM | POA: Insufficient documentation

## 2014-08-16 DIAGNOSIS — Z881 Allergy status to other antibiotic agents status: Secondary | ICD-10-CM | POA: Insufficient documentation

## 2014-08-16 HISTORY — DX: Nausea with vomiting, unspecified: Z98.890

## 2014-08-16 HISTORY — DX: Nausea with vomiting, unspecified: R11.2

## 2014-08-16 LAB — CBC
HCT: 40.5 % (ref 36.0–46.0)
HEMOGLOBIN: 13.7 g/dL (ref 12.0–15.0)
MCH: 31.6 pg (ref 26.0–34.0)
MCHC: 33.8 g/dL (ref 30.0–36.0)
MCV: 93.3 fL (ref 78.0–100.0)
Platelets: 188 10*3/uL (ref 150–400)
RBC: 4.34 MIL/uL (ref 3.87–5.11)
RDW: 12.7 % (ref 11.5–15.5)
WBC: 5.4 10*3/uL (ref 4.0–10.5)

## 2014-08-16 NOTE — Op Note (Signed)
[name], [name]                  ACCOUNT NO.:  [id number]  MEDICAL RECORD NO.:  [id number]  LOCATION:                                 FACILITY:  PHYSICIAN:  Daryll Brod, M.D.       DATE OF BIRTH:  1945-07-14  DATE OF PROCEDURE:  08/15/2014 DATE OF DISCHARGE:                              OPERATIVE REPORT   PREOPERATIVE DIAGNOSIS:  Mucoid tumor of interphalangeal joint, left thumb.  POSTOPERATIVE DIAGNOSIS:  Mucoid tumor of interphalangeal joint, left thumb.  OPERATION:  Excision of mucoid tumor with debridement of interphalangeal joint, left thumb.  SURGEON:  Daryll Brod, M.D.  ANESTHESIA:  Forearm IV regional with metacarpal block.  ANESTHESIOLOGIST:  Dr. Ola Spurr.  HISTORY:  The patient is a 69 year old female with a history of a mass on the IP joint of her left thumb, this transilluminates.  She has arthritic changes on x-ray.  She is admitted for excision of the mucoid cyst and debridement of the interphalangeal joint.  Pre, peri, and postoperative course have been discussed along with risks and complications.  She is aware there is no guarantee with the surgery; possibility of infection; recurrence of injury to arteries, nerves, tendons; incomplete relief of symptoms; dystrophy.  In the preoperative area, the patient is seen, the extremity marked by both patient and surgeon.  Antibiotic given.  PROCEDURE IN DETAIL:  The patient was brought to the operating room, where forearm-based IV regional anesthetic was carried out without difficulty under the direction of Dr. Ola Spurr.  She was prepped using ChloraPrep, supine position with the left arm free.  A 3-minute dry time was allowed.  Time-out taken, confirming the patient and procedure.  A curvilinear incision was made over the interphalangeal joint.  Proximal phalanx of left thumb after metacarpal block was given with 0.25% bupivacaine without epinephrine, approximately 8 mL was used.  The dissection carried  down, the cyst immediately encountered at the top of the extensor tendon.  This was excised and sent to Pathology.  The joint was then opened, synovectomy performed with a hemostatic rongeur and house curette.  The wound was copiously irrigated with saline.  The skin then closed with interrupted 5-0 nylon sutures.  Sterile compressive dressing and splint to the thumb was applied.  On deflation of the tourniquet, remaining fingers pinked.  She was taken to the recovery room for observation in satisfactory condition.  She will be discharged home to return to the [facility] in 1 week on Norco.          ______________________________ Daryll Brod, M.D.     GK/MEDQ  D:  08/15/2014  T:  08/16/2014  Job:  [id number]

## 2014-08-16 NOTE — Patient Instructions (Signed)
   Your procedure is scheduled on: April 11 at [place] through the [place] [facility] at: [place] up the phone at the desk and dial [phone] and inform us of your arrival.  Please call this number if you have any problems the morning of surgery: [phone]  Remember: Do not eat food or drink liquids after midnight: Sunday NIGHT   Take these medicines the morning of surgery with a SIP OF WATER: none  Do not wear jewelry, make-up, or FINGER nail polish No metal in your hair or on your body. Do not wear lotions, powders, perfumes.  You may wear deodorant.  Do not bring valuables to the hospital. Contacts, dentures or bridgework may not be worn into surgery.    Patients discharged on the day of surgery will not be allowed to drive home.

## 2014-08-18 ENCOUNTER — Encounter: Payer: Self-pay | Admitting: Obstetrics & Gynecology

## 2014-08-18 MED ORDER — CEFOTETAN DISODIUM 2 G IJ SOLR
2.0000 g | INTRAMUSCULAR | Status: AC
  Administered 2014-08-19: 2 g via INTRAVENOUS
  Filled 2014-08-18: qty 2

## 2014-08-19 ENCOUNTER — Ambulatory Visit (HOSPITAL_COMMUNITY): Payer: Medicare Other | Admitting: Certified Registered Nurse Anesthetist

## 2014-08-19 ENCOUNTER — Encounter (HOSPITAL_COMMUNITY)
Admission: RE | Disposition: A | Discharge status: Home / Self Care | Payer: Self-pay | Source: Ambulatory Visit | Attending: Obstetrics & Gynecology

## 2014-08-19 ENCOUNTER — Ambulatory Visit (HOSPITAL_COMMUNITY)
Admission: RE | Admit: 2014-08-19 | Discharge: 2014-08-19 | Disposition: A | Discharge status: Home / Self Care | Payer: Medicare Other | Source: Ambulatory Visit | Attending: Obstetrics & Gynecology | Admitting: Obstetrics & Gynecology

## 2014-08-19 ENCOUNTER — Encounter (HOSPITAL_COMMUNITY): Payer: Self-pay | Admitting: Emergency Medicine

## 2014-08-19 DIAGNOSIS — Z86718 Personal history of other venous thrombosis and embolism: Secondary | ICD-10-CM | POA: Insufficient documentation

## 2014-08-19 DIAGNOSIS — Z90710 Acquired absence of both cervix and uterus: Secondary | ICD-10-CM | POA: Insufficient documentation

## 2014-08-19 DIAGNOSIS — M1990 Unspecified osteoarthritis, unspecified site: Secondary | ICD-10-CM | POA: Diagnosis not present

## 2014-08-19 DIAGNOSIS — M8580 Other specified disorders of bone density and structure, unspecified site: Secondary | ICD-10-CM | POA: Diagnosis not present

## 2014-08-19 DIAGNOSIS — D6851 Activated protein C resistance: Secondary | ICD-10-CM | POA: Insufficient documentation

## 2014-08-19 DIAGNOSIS — J45909 Unspecified asthma, uncomplicated: Secondary | ICD-10-CM | POA: Diagnosis not present

## 2014-08-19 DIAGNOSIS — N751 Abscess of Bartholin's gland: Secondary | ICD-10-CM | POA: Diagnosis not present

## 2014-08-19 DIAGNOSIS — Z9851 Tubal ligation status: Secondary | ICD-10-CM | POA: Insufficient documentation

## 2014-08-19 DIAGNOSIS — N750 Cyst of Bartholin's gland: Secondary | ICD-10-CM | POA: Diagnosis not present

## 2014-08-19 HISTORY — PX: BARTHOLIN CYST MARSUPIALIZATION: SHX5383

## 2014-08-19 SURGERY — MARSUPIALIZATION, CYST, BARTHOLIN'S GLAND
Anesthesia: Monitor Anesthesia Care | Site: Vagina | Laterality: Right

## 2014-08-19 MED ORDER — DEXAMETHASONE SODIUM PHOSPHATE 4 MG/ML IJ SOLN
INTRAMUSCULAR | Status: AC
  Filled 2014-08-19: qty 1

## 2014-08-19 MED ORDER — LIDOCAINE HCL 1 % IJ SOLN
INTRAMUSCULAR | Status: DC | PRN
  Administered 2014-08-19: 2 mL

## 2014-08-19 MED ORDER — ACETAMINOPHEN 325 MG PO TABS: 325.0000 mg | ORAL_TABLET | ORAL | Status: DC | PRN

## 2014-08-19 MED ORDER — OXYCODONE HCL 5 MG PO TABS
5.0000 mg | ORAL_TABLET | Freq: Once | ORAL | Status: DC | PRN
Start: 2014-08-19 — End: 2014-08-19

## 2014-08-19 MED ORDER — KETOROLAC TROMETHAMINE 30 MG/ML IJ SOLN: 15.0000 mg | Freq: Once | INTRAMUSCULAR | Status: DC

## 2014-08-19 MED ORDER — PROPOFOL 10 MG/ML IV BOLUS
INTRAVENOUS | Status: DC | PRN
  Administered 2014-08-19: 20 mg via INTRAVENOUS
  Administered 2014-08-19: 40 mg via INTRAVENOUS
  Administered 2014-08-19: 20 mg via INTRAVENOUS

## 2014-08-19 MED ORDER — ONDANSETRON HCL 4 MG/2ML IJ SOLN
INTRAMUSCULAR | Status: DC | PRN
  Administered 2014-08-19: 4 mg via INTRAVENOUS

## 2014-08-19 MED ORDER — FENTANYL CITRATE 0.05 MG/ML IJ SOLN
INTRAMUSCULAR | Status: AC
  Filled 2014-08-19: qty 2

## 2014-08-19 MED ORDER — ONDANSETRON HCL 4 MG/2ML IJ SOLN
INTRAMUSCULAR | Status: AC
  Filled 2014-08-19: qty 2

## 2014-08-19 MED ORDER — OXYCODONE HCL 5 MG/5ML PO SOLN: 5.0000 mg | Freq: Once | ORAL | Status: DC | PRN

## 2014-08-19 MED ORDER — LIDOCAINE HCL (CARDIAC) 20 MG/ML IV SOLN
INTRAVENOUS | Status: AC
  Filled 2014-08-19: qty 5

## 2014-08-19 MED ORDER — LIDOCAINE HCL (CARDIAC) 20 MG/ML IV SOLN
INTRAVENOUS | Status: DC | PRN
  Administered 2014-08-19: 50 mg via INTRAVENOUS

## 2014-08-19 MED ORDER — KETOROLAC TROMETHAMINE 30 MG/ML IJ SOLN
15.0000 mg | Freq: Once | INTRAMUSCULAR | Status: AC
  Administered 2014-08-19: 15 mg via INTRAVENOUS

## 2014-08-19 MED ORDER — FENTANYL CITRATE 0.05 MG/ML IJ SOLN
INTRAMUSCULAR | Status: DC | PRN
  Administered 2014-08-19: 50 ug via INTRAVENOUS

## 2014-08-19 MED ORDER — KETOROLAC TROMETHAMINE 30 MG/ML IJ SOLN
INTRAMUSCULAR | Status: AC
  Filled 2014-08-19: qty 1

## 2014-08-19 MED ORDER — MIDAZOLAM HCL 2 MG/2ML IJ SOLN
INTRAMUSCULAR | Status: DC | PRN
  Administered 2014-08-19: 1 mg via INTRAVENOUS

## 2014-08-19 MED ORDER — LIDOCAINE HCL 1 % IJ SOLN
INTRAMUSCULAR | Status: AC
  Filled 2014-08-19: qty 20

## 2014-08-19 MED ORDER — LACTATED RINGERS IV SOLN
INTRAVENOUS | Status: DC
  Administered 2014-08-19 (×2): via INTRAVENOUS

## 2014-08-19 MED ORDER — FENTANYL CITRATE 0.05 MG/ML IJ SOLN: 25.0000 ug | INTRAMUSCULAR | Status: DC | PRN

## 2014-08-19 MED ORDER — MIDAZOLAM HCL 2 MG/2ML IJ SOLN
INTRAMUSCULAR | Status: AC
  Filled 2014-08-19: qty 2

## 2014-08-19 MED ORDER — PROPOFOL 10 MG/ML IV BOLUS
INTRAVENOUS | Status: AC
  Filled 2014-08-19: qty 20

## 2014-08-19 MED ORDER — ACETAMINOPHEN 160 MG/5ML PO SOLN: 325.0000 mg | ORAL | Status: DC | PRN

## 2014-08-19 SURGICAL SUPPLY — 26 items
BLADE SURG 15 STRL LF C SS BP (BLADE) ×1 IMPLANT
BLADE SURG 15 STRL SS (BLADE) ×2
CLOTH BEACON ORANGE TIMEOUT ST (SAFETY) ×3 IMPLANT
CONTAINER PREFILL 10% NBF 15ML (MISCELLANEOUS) ×3 IMPLANT
COUNTER NEEDLE 1200 MAGNETIC (NEEDLE) ×3 IMPLANT
ELECT REM PT RETURN 9FT ADLT (ELECTROSURGICAL) ×3
ELECTRODE REM PT RTRN 9FT ADLT (ELECTROSURGICAL) ×1 IMPLANT
GLOVE BIOGEL PI IND STRL 7.0 (GLOVE) ×1 IMPLANT
GLOVE BIOGEL PI INDICATOR 7.0 (GLOVE) ×2
GLOVE ECLIPSE 6.5 STRL STRAW (GLOVE) ×6 IMPLANT
GOWN STRL REUS W/TWL LRG LVL3 (GOWN DISPOSABLE) ×6 IMPLANT
NEEDLE HYPO 22GX1.5 SAFETY (NEEDLE) ×3 IMPLANT
NS IRRIG 1000ML POUR BTL (IV SOLUTION) ×3 IMPLANT
PACK VAGINAL MINOR WOMEN LF (CUSTOM PROCEDURE TRAY) ×3 IMPLANT
PAD OB MATERNITY 4.3X12.25 (PERSONAL CARE ITEMS) ×3 IMPLANT
PAD PREP 24X48 CUFFED NSTRL (MISCELLANEOUS) ×3 IMPLANT
PENCIL BUTTON HOLSTER BLD 10FT (ELECTRODE) IMPLANT
SUT VIC AB 2-0 CT1 27 (SUTURE) ×2
SUT VIC AB 2-0 CT1 TAPERPNT 27 (SUTURE) ×1 IMPLANT
SUT VIC AB 3-0 SH 27 (SUTURE) ×6
SUT VIC AB 3-0 SH 27X BRD (SUTURE) ×3 IMPLANT
TOWEL OR 17X24 6PK STRL BLUE (TOWEL DISPOSABLE) ×6 IMPLANT
TUBING NON-CON 1/4 X 20 CONN (TUBING) IMPLANT
TUBING NON-CON 1/4 X 20' CONN (TUBING)
WATER STERILE IRR 1000ML POUR (IV SOLUTION) ×3 IMPLANT
YANKAUER SUCT BULB TIP NO VENT (SUCTIONS) IMPLANT

## 2014-08-19 NOTE — Anesthesia Postprocedure Evaluation (Signed)
  Anesthesia Post-op Note  Patient: [name]  Procedure(s) Performed: Procedure(s): BARTHOLIN CYST MARSUPIALIZATION (Right)  Patient Location: PACU  Anesthesia Type:MAC  Level of Consciousness: awake  Airway and Oxygen Therapy: Patient Spontanous Breathing  Post-op Pain: mild  Post-op Assessment: Post-op Vital signs reviewed, Patient's Cardiovascular Status Stable, Respiratory Function Stable, Patent Airway, No signs of Nausea or vomiting and Pain level controlled  Post-op Vital Signs: Reviewed and stable  Last Vitals:  Filed Vitals:   08/19/14 0900  BP: 117/60  Pulse: 60  Temp: 36.6 C  Resp: 16    Complications: No apparent anesthesia complications

## 2014-08-19 NOTE — Transfer of Care (Signed)
Immediate Anesthesia Transfer of Care Note  Patient: [name]  Procedure(s) Performed: Procedure(s): BARTHOLIN CYST MARSUPIALIZATION (Right)  Patient Location: PACU  Anesthesia Type:MAC  Level of Consciousness: awake, alert  and oriented  Airway & Oxygen Therapy: Patient Spontanous Breathing and Patient connected to nasal cannula oxygen  Post-op Assessment: Report given to RN, Post -op Vital signs reviewed and stable and Patient moving all extremities  Post vital signs: Reviewed and stable  Last Vitals:  Filed Vitals:   08/19/14 0623  BP: 132/82  Pulse: 68  Temp: 36.7 C  Resp: 18    Complications: No apparent anesthesia complications

## 2014-08-19 NOTE — Anesthesia Preprocedure Evaluation (Signed)
Anesthesia Evaluation  Patient identified by MRN, date of birth, ID band Patient awake    Reviewed: Allergy & Precautions, NPO status , Patient's Chart, lab work & pertinent test results  History of Anesthesia Complications (+) PONV and history of anesthetic complications  Airway Mallampati: I  TM Distance: >3 FB Neck ROM: Full    Dental  (+) Teeth Intact   Pulmonary asthma ,  breath sounds clear to auscultation        Cardiovascular negative cardio ROS  Rhythm:Regular     Neuro/Psych negative neurological ROS  negative psych ROS   GI/Hepatic negative GI ROS, Neg liver ROS,   Endo/Other  negative endocrine ROS  Renal/GU negative Renal ROS     Musculoskeletal  (+) Arthritis -,   Abdominal   Peds  Hematology negative hematology ROS (+)   Anesthesia Other Findings   Reproductive/Obstetrics                             Anesthesia Physical Anesthesia Plan  ASA: II  Anesthesia Plan: MAC   Post-op Pain Management:    Induction: Intravenous  Airway Management Planned: Natural Airway  Additional Equipment: None  Intra-op Plan:   Post-operative Plan:   Informed Consent: I have reviewed the patients History and Physical, chart, labs and discussed the procedure including the risks, benefits and alternatives for the proposed anesthesia with the patient or authorized representative who has indicated his/her understanding and acceptance.   Dental advisory given  Plan Discussed with: CRNA and Surgeon  Anesthesia Plan Comments:         Anesthesia Quick Evaluation

## 2014-08-19 NOTE — H&P (Signed)
[name] is an 69 y.o. 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  The first time, a Word catheter was placed for greater than two weeks.  This prevented a recurrence for several months but [name] experienced another one last week.  She had prior marsupialization of the left Bartholin's gland abscess and has opted for this.  She understands the risks of infection and a 10-15% recurrence risks.  Bleeding is also a risk.  Pt would prefer to proceed with this instead of a gland excision at this time.  Pt also does not desire to continue to follow conservatively.  All questions answered.  Pt here and ready to proceed.  Pertinent Gynecological History: Menses: post-menopausal Bleeding: none Contraception: tubal ligation DES exposure: denies Blood transfusions: none Sexually transmitted diseases: no past history Previous GYN Procedures: none  Last mammogram: normal Date: 6/14 Last pap: normal Date: 2011, h/o hysterectomy OB History: G2, P2   Menstrual History: No LMP recorded. Patient has had a hysterectomy.    Past Medical History  Diagnosis Date  . Contact lens/glasses fitting     wears one contact lt eye  . Superficial vein thrombosis 1997  . Heterozygous factor V Leiden mutation   . Osteopenia   . PONV (postoperative nausea and vomiting)     Past Surgical History  Procedure Laterality Date  . Cesarean section  1973, 1977    x2  . Finger surgery  2002    lt long finger mass  . Total abdominal hysterectomy w/ bilateral salpingoophorectomy  1997  . Cataract extraction Right 2009       . Tubal ligation  1977  . Ganglion cyst excision Left 2/02  . Carpal tunnel release Right 12/20/2012    Procedure: CARPAL TUNNEL RELEASE;  Surgeon: Wynonia Sours, MD;  Location: [place];  Service: Orthopedics;  Laterality: Right;  . Bartholin cyst marsupialization    . Ear cyst excision Left 08/15/2014    Procedure:  LEFT THUMB DEBRIDEMENT INTERPHALANGEAL JOINT LEFT THUMB REMOVAL MUCOID CYST ;  Surgeon: Daryll Brod, MD;  Location: [place];  Service: Orthopedics;  Laterality: Left;    Family History  Problem Relation Age of Onset  . Breast cancer Mother   . Heart disease Mother   . Hyperlipidemia Mother   . Thyroid disease Mother   . Osteoporosis Mother   . Dementia Mother   . Diabetes Father   . Heart disease Father   . Cancer Father     prostate  . Cancer Brother     Eye    Social History:  reports that she has never smoked. She has never used smokeless tobacco. She reports that she drinks about 0.6 oz of alcohol per week. She reports that she does not use illicit drugs.  Allergies:  Allergies  Allergen Reactions  . Ofloxacin Itching and Swelling    Prescriptions prior to admission  Medication Sig Dispense Refill Last Dose  . acetaminophen (TYLENOL) 500 MG tablet Take 500 mg by mouth every 4 (four) hours as needed for mild pain.   Past Month at Unknown time  . albuterol (PROVENTIL HFA;VENTOLIN HFA) 108 (90 BASE) MCG/ACT inhaler Inhale into the lungs every 6 (six) hours as needed for wheezing or shortness of breath.   Past Week at Unknown time  . aspirin 81 MG tablet Take 81 mg by mouth daily.   Past Month at Unknown time  .  calcium carbonate (OS-CAL) 600 MG TABS tablet Take 600 mg by mouth 2 (two) times daily with a meal.   Past Month at Unknown time  . cephALEXin (KEFLEX) 500 MG capsule Take 1 capsule (500 mg total) by mouth 4 (four) times daily. Take QID for 7 days. 28 capsule 0 08/18/2014 at Unknown time  . fish oil-omega-3 fatty acids 1000 MG capsule Take 2 g by mouth daily.   Past Month at Unknown time  . Flaxseed, Linseed, (FLAX SEED OIL PO) Take 1 capsule by mouth daily.    Past Month at Unknown time  . HYDROcodone-acetaminophen (NORCO) 5-325 MG per tablet Take 1 tablet by mouth every 6 (six) hours as needed for moderate pain. 30 tablet 0  at Unknown time  . ibuprofen  (ADVIL,MOTRIN) 200 MG tablet Take 200 mg by mouth every 6 (six) hours as needed for moderate pain.   Past Week at Unknown time  . montelukast (SINGULAIR) 10 MG tablet Take 10 mg by mouth at bedtime.   Past Week at Unknown time  . ranitidine (ZANTAC) 75 MG tablet Take 75 mg by mouth at bedtime as needed for heartburn.   08/18/2014 at 2200  . estradiol (ESTRACE) 1 MG tablet Take 1 tablet (1 mg total) by mouth daily. 1/2 tablet daily (Patient taking differently: Take 1 mg by mouth daily. 1/2 tablet 3 x week) 30 tablet 12 08/14/2014 at Unknown time  . Multiple Vitamins-Minerals (MULTIVITAMIN WITH MINERALS) tablet Take 1 tablet by mouth daily.   Past Week at Unknown time  . naproxen sodium (ANAPROX) 220 MG tablet Take 220 mg by mouth daily.    Past Week at Unknown time  . Plant Sterols and Stanols (CHOLESTOFF PO) Take by mouth daily.    Past Week at Unknown time  . vitamin C (ASCORBIC ACID) 500 MG tablet Take 500 mg by mouth daily.   Past Week at Unknown time    Review of Systems  All other systems reviewed and are negative.   Blood pressure 132/82, pulse 68, temperature 98.1 F (36.7 C), temperature source Oral, resp. rate 18, SpO2 99 %. Physical Exam  Constitutional: She is oriented to person, place, and time. She appears well-developed and well-nourished.  Cardiovascular: Normal rate and regular rhythm.   Respiratory: Effort normal and breath sounds normal.  Neurological: She is alert and oriented to person, place, and time.  Skin: Skin is warm and dry.  Psychiatric: She has a normal mood and affect.    No results found for this or any previous visit (from the past 24 hour(s)).  No results found.  Assessment/Plan: 69 yo G2P2 MWF with recurrent right Bartholin's gland abscess here for marsupialization of the gland.  Consent obtained.  Questions answered.  Pt here and ready to proceed.  [name] 08/19/2014, 7:00 AM

## 2014-08-19 NOTE — Op Note (Signed)
08/19/2014  8:08 AM  PATIENT:  [name]  69 y.o. female  PRE-OPERATIVE DIAGNOSIS:  recurrent right bartholin cyst/abscess, s/p word catheter placement  POST-OPERATIVE DIAGNOSIS:  Same  PROCEDURE:  Procedure(s): BARTHOLIN CYST MARSUPIALIZATION  SURGEON:  Juline Sanderford SUZANNE  ASSISTANTS: OR staff   ANESTHESIA:   IV sedation  ESTIMATED BLOOD LOSS: 5cc  BLOOD ADMINISTERED:none   FLUIDS: 800cc LR  UOP: 50cc drained with I&O cath at beginning of procedure  SPECIMEN:  none  DISPOSITION OF SPECIMEN:  N/A  FINDINGS: decompressed right Bartholin's abscess with word catheter still in place  DESCRIPTION OF OPERATION: Patient was taken to the operating room.  She is placed in the supine position. SCDs were on her lower extremities and functioning properly. IV sedation was used for anesthesia.  Once adequate anesthesia was confirmed, legs were then placed in the [place] in the low lithotomy position. The legs were lifted to the high lithotomy position and the Betadine prep was used on the inner thighs perineum and vagina x3. Patient was draped in a normal standard fashion. An in and out catheterization with a red rubber Foley catheter was performed. Approximately 50 cc of clear urine was noted.   The word catheter was grasped.  With the bulb still inflated, the incision into the Bartholin's cyst/abscess was extended anteriorly.   This prior incision was within the vaginal mucosa but was outside the hymenal ring.  The word catheter was removed.  Hemostats were used to ensure there were no loculations within the cyst/abscess.  This was irritated with sterile saline. The cyst wall was grasped anteriorly.  Using 3.0# Vicryl, the cyst wall was sutured to the vaginal mucosa using single interrupted stitches.  These stitches were placed circumferentially around the opening into the cyst.  Irrigation was performed again within the cyst and outside the cyst.  1% lidocaine, plain, was then  injected into the edge of the marsupialized opening.  None of this procedure was anywhere close to the rectum.  At this point, the procedure was ended.   The legs are positioned back in the supine position. Sponge, lap, needle, initially counts were correct x2. Patient was awakened and taken to recovery in stable condition.  COUNTS:  YES  PLAN OF CARE: Transfer to PACU

## 2014-08-19 NOTE — Discharge Instructions (Addendum)
Post-surgical Instructions, Outpatient Surgery  You may expect to feel dizzy, weak, and drowsy for as long as 24 hours after receiving the medicine that made you sleep (anesthetic). For the first 24 hours after your surgery:    Do not drive a car, ride a bicycle, participate in physical activities, or take public transportation until you are done taking narcotic pain medicines or as directed by Dr. Sabra Heck.   Do not drink alcohol or take tranquilizers.   Do not take medicine that has not been prescribed by your physicians.   Do not sign important papers or make important decisions while on narcotic pain medicines.   Have a responsible person with you.   CARE OF INCISION  Use soap and water only to clean.  Start SITZ baths in two days after your surgery.  Do this at least daily until I see you back in the office.  You may shower on the first day after your surgery.  You may sit in a bath tub as well.  Do not use bath salts or bubble bath.  Avoid heavy lifting (more than 10 pounds/4.5 kilograms), pushing, or pulling for one week  PAIN MANAGEMENT  Motrin 800mg .  (This is the same as 4-200mg  over the counter tablets of Motrin or ibuprofen.)  You may take this every eight hours or as needed for cramping.  You can use the pain medication you have at home.    DO'S AND DON'T'S  Do not do any heavy lifting for one week.  This increases the chance of bleeding.  Do move around as you feel able.  Stairs are fine.  You may begin to exercise again as you feel able.    Do not drive for 24 hours after your procedure.  Do not put anything in the vagina until your post op appt which is now in about 4 weeks.  REGULAR MEDIATIONS/VITAMINS:  You may restart all of your regular medications as prescribed.  You may restart all of your vitamins as you normally take them once you are having regular bowel movements.  Please take Colace 100mg  daily to make your bowel movements softer.  Do this for  two weeks.  You can restart your aspirin.  PLEASE CALL OR SEEK MEDICAL CARE IF:  You have an oral temperature above 100.5.   You have constipation that is not helped by adjusting diet or increasing fluid intake. Pain medicines are a common cause of constipation.   You have heavy vaginal bleeding  You have redness or drainage from your incision(s) or there is increasing pain or tenderness near or in the surgical site.

## 2014-08-20 ENCOUNTER — Encounter (HOSPITAL_COMMUNITY): Payer: Self-pay | Admitting: Obstetrics & Gynecology

## 2014-08-26 ENCOUNTER — Ambulatory Visit: Payer: Medicare Other | Admitting: Obstetrics & Gynecology

## 2014-09-16 ENCOUNTER — Ambulatory Visit: Payer: Medicare Other | Admitting: Obstetrics & Gynecology

## 2014-09-16 ENCOUNTER — Telehealth: Payer: Self-pay | Admitting: Obstetrics & Gynecology

## 2014-09-16 NOTE — Telephone Encounter (Signed)
Spoke with patient. Advised of message as seen below from [place]. Patient is agreeable and verbalizes understanding.  Routing to provider for final review. Patient agreeable to disposition. Patient aware provider will review message and nurse will return call with any additional instructions or change of disposition. Will close encounter.

## 2014-09-16 NOTE — Telephone Encounter (Signed)
Call to patient about canceled appointment with Dr. Jeanie Cooks. Patient is a 4 week post op and she states she is not having any problems. Please call to reschedule the appointment.

## 2014-09-16 NOTE — Telephone Encounter (Signed)
Intercourse is ok.  If she is doing well, ok to schedule post op appt out further if something later works for her.  Thanks.

## 2014-09-16 NOTE — Telephone Encounter (Signed)
Spoke with patient. Patient states that she does not have any concerns and is not currently having any problems post operatively. Patient had bartholin cyst removal on 4/11 with Dr.Miller. Offered patient appointment for 5/13 at 12:45pm with Dr.Miller but patient declines due to being out of town. Offered 5/20 appointment but patient declines. Appointment scheduled for 5/26 at 12:45pm with Dr.Miller. Patient is agreeable to date and time. Will return call to move appointment up if anything changes. Patient would like to know if she may have intercourse at this time. "I know I was supposed to wait until I saw her today but now I wont see her for another two weeks." Advised will speak with Dr.Miller and return call with further recommendations. Patient is agreeable.

## 2014-09-30 ENCOUNTER — Other Ambulatory Visit: Payer: Self-pay

## 2014-09-30 DIAGNOSIS — Z1231 Encounter for screening mammogram for malignant neoplasm of breast: Secondary | ICD-10-CM

## 2014-10-03 ENCOUNTER — Ambulatory Visit (INDEPENDENT_AMBULATORY_CARE_PROVIDER_SITE_OTHER): Payer: Medicare Other | Admitting: Obstetrics & Gynecology

## 2014-10-03 VITALS — BP 120/84 | HR 64 | Resp 16 | Wt 146.2 lb

## 2014-10-03 DIAGNOSIS — N751 Abscess of Bartholin's gland: Secondary | ICD-10-CM | POA: Diagnosis not present

## 2014-10-03 DIAGNOSIS — J320 Chronic maxillary sinusitis: Secondary | ICD-10-CM | POA: Diagnosis not present

## 2014-10-03 MED ORDER — AMOXICILLIN-POT CLAVULANATE 875-125 MG PO TABS: 1.0000 | ORAL_TABLET | Freq: Two times a day (BID) | ORAL | Status: DC

## 2014-10-03 MED ORDER — FLUCONAZOLE 150 MG PO TABS: 150.0000 mg | ORAL_TABLET | Freq: Once | ORAL | Status: DC

## 2014-10-03 NOTE — Progress Notes (Signed)
Post Operative Visit  Procedure: Bartholin's marsupialization Days Post-op: 6 weeks  Subjective: Doing well.  No vaginal bleeding.  No discharge.  No pain.  Feels "normal".  Never really took anything for pain.  Major complaint today is upper respiratory/sinus issues.  Has been having this off and on since March.  This is causing pressure all along her sinuses under his nose.  Having a lot of mucus production that is resulting in coughing.  She is not having any SOB or wheezing.  Dr. Harrington Challenger gave her Singular, thinking this was allergies, without improvement.  With her cough, she is having some mild sputum production.  Has been on Mucinex and Flonase.  Has taken Augmentin in march which really helped.  Denies fever.  Pt is a non smoker.  Objective: There were no vitals taken for this visit.  EXAM General: alert and cooperative  HEENT:  Tenderness along maxillary sinuses, no LAD, trachea midline, no thyroid enlargement Lungs:  CTA bilaterally CV:  RRR without M/R/G Gyn:  NAEFG, both Bartholin's cyst are patent, non tender Extremities: extremities normal, atraumatic, no cyanosis or edema Vaginal Bleeding: none  Assessment: s/p Bartholin's marsupialization, well healed Maxillary sinusitis  Plan: Augmentin 875mg  bid x 10 days  Rx for Diflucan 150mg  pox 1, repeat 48-72 hrs if needed.  #2/1RF Return to office for routine AEX, already scheduled

## 2014-10-04 ENCOUNTER — Encounter: Payer: Self-pay | Admitting: Obstetrics & Gynecology

## 2014-11-06 ENCOUNTER — Ambulatory Visit
Admission: RE | Admit: 2014-11-06 | Discharge: 2014-11-06 | Disposition: A | Discharge status: Home / Self Care | Payer: Medicare Other | Source: Ambulatory Visit

## 2014-11-06 DIAGNOSIS — Z1231 Encounter for screening mammogram for malignant neoplasm of breast: Secondary | ICD-10-CM | POA: Diagnosis not present

## 2014-12-05 DIAGNOSIS — R300 Dysuria: Secondary | ICD-10-CM | POA: Diagnosis not present

## 2015-02-22 DIAGNOSIS — Z23 Encounter for immunization: Secondary | ICD-10-CM | POA: Diagnosis not present

## 2015-03-26 DIAGNOSIS — H04123 Dry eye syndrome of bilateral lacrimal glands: Secondary | ICD-10-CM | POA: Diagnosis not present

## 2015-03-26 DIAGNOSIS — H35372 Puckering of macula, left eye: Secondary | ICD-10-CM | POA: Diagnosis not present

## 2015-03-26 DIAGNOSIS — H35371 Puckering of macula, right eye: Secondary | ICD-10-CM | POA: Diagnosis not present

## 2015-03-26 DIAGNOSIS — H40013 Open angle with borderline findings, low risk, bilateral: Secondary | ICD-10-CM | POA: Diagnosis not present

## 2015-03-26 DIAGNOSIS — H43813 Vitreous degeneration, bilateral: Secondary | ICD-10-CM | POA: Diagnosis not present

## 2015-03-26 DIAGNOSIS — H2512 Age-related nuclear cataract, left eye: Secondary | ICD-10-CM | POA: Diagnosis not present

## 2015-04-09 DIAGNOSIS — Z131 Encounter for screening for diabetes mellitus: Secondary | ICD-10-CM | POA: Diagnosis not present

## 2015-04-09 DIAGNOSIS — Z23 Encounter for immunization: Secondary | ICD-10-CM | POA: Diagnosis not present

## 2015-04-09 DIAGNOSIS — M25559 Pain in unspecified hip: Secondary | ICD-10-CM | POA: Diagnosis not present

## 2015-04-09 DIAGNOSIS — Z0000 Encounter for general adult medical examination without abnormal findings: Secondary | ICD-10-CM | POA: Diagnosis not present

## 2015-04-09 DIAGNOSIS — E7800 Pure hypercholesterolemia, unspecified: Secondary | ICD-10-CM | POA: Diagnosis not present

## 2015-04-17 DIAGNOSIS — M545 Low back pain: Secondary | ICD-10-CM | POA: Diagnosis not present

## 2015-04-17 DIAGNOSIS — M25559 Pain in unspecified hip: Secondary | ICD-10-CM | POA: Diagnosis not present

## 2015-04-21 DIAGNOSIS — M545 Low back pain: Secondary | ICD-10-CM | POA: Diagnosis not present

## 2015-04-21 DIAGNOSIS — M25559 Pain in unspecified hip: Secondary | ICD-10-CM | POA: Diagnosis not present

## 2015-04-23 DIAGNOSIS — M25559 Pain in unspecified hip: Secondary | ICD-10-CM | POA: Diagnosis not present

## 2015-04-23 DIAGNOSIS — M545 Low back pain: Secondary | ICD-10-CM | POA: Diagnosis not present

## 2015-04-25 DIAGNOSIS — M545 Low back pain: Secondary | ICD-10-CM | POA: Diagnosis not present

## 2015-04-25 DIAGNOSIS — M25559 Pain in unspecified hip: Secondary | ICD-10-CM | POA: Diagnosis not present

## 2015-04-28 DIAGNOSIS — M545 Low back pain: Secondary | ICD-10-CM | POA: Diagnosis not present

## 2015-04-28 DIAGNOSIS — M25559 Pain in unspecified hip: Secondary | ICD-10-CM | POA: Diagnosis not present

## 2015-04-30 DIAGNOSIS — M25559 Pain in unspecified hip: Secondary | ICD-10-CM | POA: Diagnosis not present

## 2015-04-30 DIAGNOSIS — M545 Low back pain: Secondary | ICD-10-CM | POA: Diagnosis not present

## 2015-05-07 DIAGNOSIS — M25559 Pain in unspecified hip: Secondary | ICD-10-CM | POA: Diagnosis not present

## 2015-05-07 DIAGNOSIS — M545 Low back pain: Secondary | ICD-10-CM | POA: Diagnosis not present

## 2015-05-09 DIAGNOSIS — M25559 Pain in unspecified hip: Secondary | ICD-10-CM | POA: Diagnosis not present

## 2015-05-09 DIAGNOSIS — M545 Low back pain: Secondary | ICD-10-CM | POA: Diagnosis not present

## 2015-08-18 ENCOUNTER — Other Ambulatory Visit: Payer: Self-pay | Admitting: Obstetrics & Gynecology

## 2015-08-18 NOTE — Telephone Encounter (Signed)
Medication refill request: estrace 1mg  Last AEX:  07/30/14 Next AEX: 10/07/15 Last MMG (if hormonal medication request): bilateral BIRADS1 neg 11/06/14 Refill authorized: 07/30/14 #30 12refills today #30-1ref?

## 2015-08-19 DIAGNOSIS — Q279 Congenital malformation of peripheral vascular system, unspecified: Secondary | ICD-10-CM | POA: Diagnosis not present

## 2015-08-19 DIAGNOSIS — L814 Other melanin hyperpigmentation: Secondary | ICD-10-CM | POA: Diagnosis not present

## 2015-08-19 DIAGNOSIS — D225 Melanocytic nevi of trunk: Secondary | ICD-10-CM | POA: Diagnosis not present

## 2015-08-19 DIAGNOSIS — Z808 Family history of malignant neoplasm of other organs or systems: Secondary | ICD-10-CM | POA: Diagnosis not present

## 2015-08-19 DIAGNOSIS — L821 Other seborrheic keratosis: Secondary | ICD-10-CM | POA: Diagnosis not present

## 2015-08-19 DIAGNOSIS — D1800 Hemangioma unspecified site: Secondary | ICD-10-CM | POA: Diagnosis not present

## 2015-08-19 DIAGNOSIS — L988 Other specified disorders of the skin and subcutaneous tissue: Secondary | ICD-10-CM | POA: Diagnosis not present

## 2015-08-19 NOTE — Telephone Encounter (Signed)
Medication refill request: Estradiol 1mg  Last AEX:  07/30/14 MSM Next AEX: 10/07/15 MSM Last MMG (if hormonal medication request): 11/06/14 BIRADS1 negative Refill authorized: 07/30/14 #30 w/12refills today #1pack w/1refill?

## 2015-08-19 NOTE — Telephone Encounter (Signed)
Patient is calling for refill status. I informed patient of 48 hour medication refill policy. Patient has 3 pills remaining. No need to call unless you have a question.

## 2015-09-12 ENCOUNTER — Other Ambulatory Visit: Payer: Self-pay

## 2015-09-12 DIAGNOSIS — Z1231 Encounter for screening mammogram for malignant neoplasm of breast: Secondary | ICD-10-CM

## 2015-10-07 ENCOUNTER — Ambulatory Visit (INDEPENDENT_AMBULATORY_CARE_PROVIDER_SITE_OTHER): Payer: Medicare Other | Admitting: Obstetrics & Gynecology

## 2015-10-07 ENCOUNTER — Encounter: Payer: Self-pay | Admitting: Obstetrics & Gynecology

## 2015-10-07 VITALS — BP 112/78 | HR 66 | Resp 16 | Ht 65.25 in | Wt 152.4 lb

## 2015-10-07 DIAGNOSIS — Z124 Encounter for screening for malignant neoplasm of cervix: Secondary | ICD-10-CM | POA: Diagnosis not present

## 2015-10-07 DIAGNOSIS — M8580 Other specified disorders of bone density and structure, unspecified site: Secondary | ICD-10-CM

## 2015-10-07 DIAGNOSIS — R200 Anesthesia of skin: Secondary | ICD-10-CM

## 2015-10-07 DIAGNOSIS — R208 Other disturbances of skin sensation: Secondary | ICD-10-CM

## 2015-10-07 DIAGNOSIS — Z0000 Encounter for general adult medical examination without abnormal findings: Secondary | ICD-10-CM | POA: Diagnosis not present

## 2015-10-07 DIAGNOSIS — R8290 Unspecified abnormal findings in urine: Secondary | ICD-10-CM | POA: Diagnosis not present

## 2015-10-07 DIAGNOSIS — Z1211 Encounter for screening for malignant neoplasm of colon: Secondary | ICD-10-CM

## 2015-10-07 DIAGNOSIS — Z01419 Encounter for gynecological examination (general) (routine) without abnormal findings: Secondary | ICD-10-CM

## 2015-10-07 DIAGNOSIS — Z1389 Encounter for screening for other disorder: Secondary | ICD-10-CM | POA: Diagnosis not present

## 2015-10-07 DIAGNOSIS — E2839 Other primary ovarian failure: Secondary | ICD-10-CM

## 2015-10-07 DIAGNOSIS — R8299 Other abnormal findings in urine: Secondary | ICD-10-CM | POA: Diagnosis not present

## 2015-10-07 DIAGNOSIS — Z205 Contact with and (suspected) exposure to viral hepatitis: Secondary | ICD-10-CM

## 2015-10-07 DIAGNOSIS — R937 Abnormal findings on diagnostic imaging of other parts of musculoskeletal system: Secondary | ICD-10-CM

## 2015-10-07 DIAGNOSIS — Z20828 Contact with and (suspected) exposure to other viral communicable diseases: Secondary | ICD-10-CM | POA: Diagnosis not present

## 2015-10-07 LAB — POCT URINALYSIS DIPSTICK
Bilirubin, UA: NEGATIVE
Blood, UA: NEGATIVE
Glucose, UA: NEGATIVE
Ketones, UA: NEGATIVE
Leukocytes, UA: NEGATIVE
NITRITE UA: NEGATIVE
PROTEIN UA: NEGATIVE
UROBILINOGEN UA: NEGATIVE
pH, UA: 5

## 2015-10-07 MED ORDER — ESTRADIOL 1 MG PO TABS: 1.0000 mg | ORAL_TABLET | Freq: Every day | ORAL | Status: DC

## 2015-10-07 NOTE — Progress Notes (Signed)
70 y.o. G2P2 MarriedCaucasianF here for annual exam.  Doing well.  No issues with Bartholin's abscess since marsupialization.    Just got back from trip to [place].    Denies vaginal bleeding.  Having some issues of intermittent right foot numbness.  Not every day.  Would like to see Dr. Ninfa Linden.  PCP:  Dr. Harrington Challenger.  Last visit was 12/16.  Blood work was done then.  Cholesterol remains elevated.    No LMP recorded. Patient has had a hysterectomy.          Sexually active: Yes.    The current method of family planning is post menopausal status.    Exercising: Yes.    Walking, light yoga, yard work.  Smoker:  no  Health Maintenance: Pap:  H/o hysterectomy History of abnormal Pap:  no MMG:  11/06/2014 BIRADS 1 negative  Colonoscopy:  11/2005 repeat every 10 years BMD:   04/07/2011 osteopenia TDaP:  Pt states <10 years  Pneumonia vaccine(s):  2016  Zostavax:   <5 years Hep C testing: will do today day: PCP, Urine today: pending   reports that she has never smoked. She has never used smokeless tobacco. She reports that she drinks about 0.6 oz of alcohol per week. She reports that she does not use illicit drugs.  Past Medical History  Diagnosis Date  . Contact lens/glasses fitting     wears one contact lt eye  . Superficial vein thrombosis 1997  . Heterozygous factor V Leiden mutation   . Osteopenia   . PONV (postoperative nausea and vomiting)     Past Surgical History  Procedure Laterality Date  . Cesarean section  1973, 1977    x2  . Finger surgery  2002    lt long finger mass  . Total abdominal hysterectomy w/ bilateral salpingoophorectomy  1997  . Cataract extraction Right 2009       . Tubal ligation  1977  . Ganglion cyst excision Left 2/02  . Carpal tunnel release Right 12/20/2012    Procedure: CARPAL TUNNEL RELEASE;  Surgeon: Wynonia Sours, MD;  Location: [facility];  Service: Orthopedics;  Laterality: Right;  . Bartholin cyst marsupialization    . Ear  cyst excision Left 08/15/2014    Procedure: LEFT THUMB DEBRIDEMENT INTERPHALANGEAL JOINT LEFT THUMB REMOVAL MUCOID CYST ;  Surgeon: Daryll Brod, MD;  Location: [facility];  Service: Orthopedics;  Laterality: Left;  . Bartholin cyst marsupialization Right 08/19/2014    Procedure: BARTHOLIN CYST MARSUPIALIZATION;  Surgeon: Megan Salon, MD;  Location: [facility];  Service: Gynecology;  Laterality: Right;    Current Outpatient Prescriptions  Medication Sig Dispense Refill  . acetaminophen (TYLENOL) 500 MG tablet Take 500 mg by mouth every 4 (four) hours as needed for mild pain.    Marland Kitchen albuterol (PROVENTIL HFA;VENTOLIN HFA) 108 (90 BASE) MCG/ACT inhaler Inhale into the lungs every 6 (six) hours as needed for wheezing or shortness of breath.    Marland Kitchen amoxicillin-clavulanate (AUGMENTIN) 875-125 MG per tablet Take 1 tablet by mouth 2 (two) times daily. 20 tablet 0  . aspirin 81 MG tablet Take 81 mg by mouth daily.    . calcium carbonate (OS-CAL) 600 MG TABS tablet Take 600 mg by mouth 2 (two) times daily with a meal.    . estradiol (ESTRACE) 1 MG tablet TAKE 1 TABLET BY MOUTH EVERY DAY 30 tablet 1  . fish oil-omega-3 fatty acids 1000 MG capsule Take 2 g by mouth daily.    Marland Kitchen  Flaxseed, Linseed, (FLAX SEED OIL PO) Take 1 capsule by mouth daily.     . fluconazole (DIFLUCAN) 150 MG tablet Take 1 tablet (150 mg total) by mouth once. Take one tablet.  Repeat in 48-72 if needed 2 tablet 1  . ibuprofen (ADVIL,MOTRIN) 200 MG tablet Take 200 mg by mouth every 6 (six) hours as needed for moderate pain.    . montelukast (SINGULAIR) 10 MG tablet Take 10 mg by mouth at bedtime.    . Multiple Vitamins-Minerals (MULTIVITAMIN WITH MINERALS) tablet Take 1 tablet by mouth daily.    . naproxen sodium (ANAPROX) 220 MG tablet Take 220 mg by mouth daily.     . Plant Sterols and Stanols (CHOLESTOFF PO) Take by mouth daily.     . ranitidine (ZANTAC) 75 MG tablet Take 75 mg by mouth at bedtime as needed for heartburn.    .  vitamin C (ASCORBIC ACID) 500 MG tablet Take 500 mg by mouth daily.     No current facility-administered medications for this visit.    Family History  Problem Relation Age of Onset  . Breast cancer Mother   . Heart disease Mother   . Hyperlipidemia Mother   . Thyroid disease Mother   . Osteoporosis Mother   . Dementia Mother   . Diabetes Father   . Heart disease Father   . Cancer Father     prostate  . Cancer Brother     Eye    ROS:  Pertinent items are noted in HPI.  Otherwise, a comprehensive ROS was negative.  Exam:   Filed Vitals:   10/07/15 1255  BP: 112/78  Pulse: 66  Resp: 16  Height: 5' 5.25" (1.657 m)  Weight: 152 lb 6.4 oz (69.128 kg)    General appearance: alert, cooperative and appears stated age Head: Normocephalic, without obvious abnormality, atraumatic Neck: no adenopathy, supple, symmetrical, trachea midline and thyroid normal to inspection and palpation Lungs: clear to auscultation bilaterally Breasts: normal appearance, no masses or tenderness Heart: regular rate and rhythm Abdomen: soft, non-tender; bowel sounds normal; no masses,  no organomegaly Extremities: extremities normal, atraumatic, no cyanosis or edema Skin: Skin color, texture, turgor normal. No rashes or lesions Lymph nodes: Cervical, supraclavicular, and axillary nodes normal. No abnormal inguinal nodes palpated Neurologic: Grossly normal   Pelvic: External genitalia:  no lesions              Urethra:  normal appearing urethra with no masses, tenderness or lesions              Bartholins and Skenes: normal                 Vagina: normal appearing vagina with normal color and discharge, no lesions              Cervix: absent              Pap taken: No. Bimanual Exam:  Uterus:  uterus absent              Adnexa: no mass, fullness, tenderness               Rectovaginal: Confirms               Anus:  normal sphincter tone, no lesions  Chaperone was present for exam.  A:   Well Woman with normal exam H/O TAH/BSO due to bleeding PMP on HRT H/O Factor V Leiden heterozygous (saw Dr. Marin Olp once--HRT ok) H/O depression H/O recurrent  UTI H/O left bartholin's marsupialization and current right Bartholin's cyst--non tender. No treatment recommended at this time. Right foot numbness Urine odor  P: Mammogram yearly HRT risks discussed including heart attack, breast cancer. Continue estradiol 1.0 mg 1/2 tab three times weekly. Rx to pharmacy. pap smear not indicated Hep C antibody screening today Referral to [facility] Referral to Dr. Watt Climes for screening colonoscopy Order for BMD placed today Urine micro and urine culture AEX 1 year or follow-up

## 2015-10-08 ENCOUNTER — Telehealth: Payer: Self-pay | Admitting: Obstetrics & Gynecology

## 2015-10-08 DIAGNOSIS — E2839 Other primary ovarian failure: Secondary | ICD-10-CM

## 2015-10-08 DIAGNOSIS — Z780 Asymptomatic menopausal state: Secondary | ICD-10-CM

## 2015-10-08 DIAGNOSIS — M8580 Other specified disorders of bone density and structure, unspecified site: Secondary | ICD-10-CM

## 2015-10-08 LAB — HEPATITIS C ANTIBODY: HCV Ab: NEGATIVE

## 2015-10-08 LAB — URINALYSIS, MICROSCOPIC ONLY
CASTS: NONE SEEN [LPF]
Crystals: NONE SEEN [HPF]
RBC / HPF: NONE SEEN RBC/HPF (ref <=2)
Squamous Epithelial / LPF: NONE SEEN [HPF] (ref <=5)
YEAST: NONE SEEN [HPF]

## 2015-10-08 NOTE — Telephone Encounter (Signed)
Patient is scheduled for mammogram on 11/07/2015 at  [facility]. She would like to have her BMD at the same appointment. New order for BMD placed for patient as previous order was set before the patient is scheduled. Notified patient. She is agreeable and will [facility] to [facility] BMD with mammogram.  Routing to provider for final review. Patient agreeable to disposition. Will close encounter.

## 2015-10-08 NOTE — Telephone Encounter (Signed)
Patient is scheduled 11/07/15 for her mammogram. Patient states she would like to have her bone density at same appointment. [facility] informed patient order required. Please advise.

## 2015-10-09 LAB — URINE CULTURE

## 2015-10-13 ENCOUNTER — Other Ambulatory Visit: Payer: Self-pay

## 2015-10-13 DIAGNOSIS — M8580 Other specified disorders of bone density and structure, unspecified site: Secondary | ICD-10-CM

## 2015-10-13 DIAGNOSIS — E2839 Other primary ovarian failure: Secondary | ICD-10-CM

## 2015-10-14 ENCOUNTER — Telehealth: Payer: Self-pay | Admitting: Obstetrics & Gynecology

## 2015-10-14 MED ORDER — NITROFURANTOIN MONOHYD MACRO 100 MG PO CAPS: 100.0000 mg | ORAL_CAPSULE | Freq: Two times a day (BID) | ORAL | Status: DC

## 2015-10-14 NOTE — Telephone Encounter (Signed)
Please inform culture showed klebsiella and should treat with macrobid 100mg  bid x 7 days.  Repeat urine culture two weeks.  Orders have not been placed.  Thanks.

## 2015-10-14 NOTE — Telephone Encounter (Signed)
Spoke with patient. Advised of message as seen below from [place]. She is agreeable and verbalizes understanding. Rx for Macrobid 100 mg bid x 7 days #14 0RF sent to pharmacy on file. Nurse visit for repeat urine culture scheduled for 10/28/2015 at 9:30 am. She is agreeable to date and time.  Routing to provider for final review. Patient agreeable to disposition. Will close encounter.

## 2015-10-14 NOTE — Telephone Encounter (Signed)
Routing to [place] for review and advise of urine culture results from 10/07/2015. Results to Dr.Miller's desk for review and advise as well.

## 2015-10-14 NOTE — Telephone Encounter (Signed)
Patient is calling for recent urine results. Patient last seen 10/07/15.

## 2015-10-28 ENCOUNTER — Encounter: Payer: Self-pay | Admitting: Obstetrics and Gynecology

## 2015-10-28 ENCOUNTER — Ambulatory Visit (INDEPENDENT_AMBULATORY_CARE_PROVIDER_SITE_OTHER): Payer: Medicare Other | Admitting: Obstetrics and Gynecology

## 2015-10-28 VITALS — BP 120/80 | HR 72 | Resp 14 | Ht 65.25 in | Wt 152.2 lb

## 2015-10-28 DIAGNOSIS — R300 Dysuria: Secondary | ICD-10-CM | POA: Diagnosis not present

## 2015-10-28 DIAGNOSIS — N3090 Cystitis, unspecified without hematuria: Secondary | ICD-10-CM

## 2015-10-28 LAB — POCT URINALYSIS DIPSTICK
BILIRUBIN UA: NEGATIVE
GLUCOSE UA: NEGATIVE
Ketones, UA: NEGATIVE
Nitrite, UA: NEGATIVE
Protein, UA: NEGATIVE
RBC UA: NEGATIVE
Urobilinogen, UA: NEGATIVE
pH, UA: 8

## 2015-10-28 LAB — URINALYSIS, MICROSCOPIC ONLY
CRYSTALS: NONE SEEN [HPF]
Casts: NONE SEEN [LPF]
RBC / HPF: NONE SEEN RBC/HPF (ref <=2)
Yeast: NONE SEEN [HPF]

## 2015-10-28 MED ORDER — SULFAMETHOXAZOLE-TRIMETHOPRIM 800-160 MG PO TABS: 1.0000 | ORAL_TABLET | Freq: Two times a day (BID) | ORAL | Status: DC

## 2015-10-28 NOTE — Progress Notes (Signed)
GYNECOLOGY  VISIT   HPI: 70 y.o.   Married  Caucasian  female   G2P2 with No LMP recorded. Patient has had a hysterectomy.   here for  TOC. Pt c/o dysuria still. The patient was diagnosed with a UTI several weeks ago, diagnosed when she presented with a urinary odor. The odor resolved with treatment. 2 days ago she started having dysuria. Slight urinary frequency and urgency. No fevers or flank pain. She has h/o recurrent UTI's, in the past she was on prophylaxis. She hasn't had a UTI for over a year prior to this last infection. She is still on ERT, small amount. Last time she was treated with macrobid.  She is sexually active, does notice a correlation with intercourse. She always voids before and after intercourse.   GYNECOLOGIC HISTORY: No LMP recorded. Patient has had a hysterectomy. Contraception:None Menopausal hormone therapy: Estrace 0.5mg  3xwkly        OB History    Gravida Para Term Preterm AB TAB SAB Ectopic Multiple Living   2 2                 Patient Active Problem List   Diagnosis Date Noted  . Bartholin's gland abscess 03/08/2014  . ASTHMA 10/05/2007  . OSTEOARTHRITIS 10/05/2007  . FOREIGN BODY, ASPIRATION 10/05/2007    Past Medical History  Diagnosis Date  . Contact lens/glasses fitting     wears one contact lt eye  . Superficial vein thrombosis 1997  . Heterozygous factor V Leiden mutation ([place])   . Osteopenia   . PONV (postoperative nausea and vomiting)     Past Surgical History  Procedure Laterality Date  . Cesarean section  1973, 1977    x2  . Finger surgery  2002    lt long finger mass  . Total abdominal hysterectomy w/ bilateral salpingoophorectomy  1997  . Cataract extraction Right 2009       . Tubal ligation  1977  . Ganglion cyst excision Left 2/02  . Carpal tunnel release Right 12/20/2012    Procedure: CARPAL TUNNEL RELEASE;  Surgeon: Wynonia Sours, MD;  Location: [facility];  Service: Orthopedics;  Laterality: Right;  .  Bartholin cyst marsupialization    . Ear cyst excision Left 08/15/2014    Procedure: LEFT THUMB DEBRIDEMENT INTERPHALANGEAL JOINT LEFT THUMB REMOVAL MUCOID CYST ;  Surgeon: Daryll Brod, MD;  Location: [facility];  Service: Orthopedics;  Laterality: Left;  . Bartholin cyst marsupialization Right 08/19/2014    Procedure: BARTHOLIN CYST MARSUPIALIZATION;  Surgeon: Megan Salon, MD;  Location: [facility];  Service: Gynecology;  Laterality: Right;    Current Outpatient Prescriptions  Medication Sig Dispense Refill  . acetaminophen (TYLENOL) 500 MG tablet Take 500 mg by mouth every 4 (four) hours as needed for mild pain.    Marland Kitchen aspirin 81 MG tablet Take 81 mg by mouth daily.    . calcium carbonate (OS-CAL) 600 MG TABS tablet Take 600 mg by mouth 2 (two) times daily with a meal.    . estradiol (ESTRACE) 1 MG tablet Take 1 tablet (1 mg total) by mouth daily. 30 tablet 12  . fish oil-omega-3 fatty acids 1000 MG capsule Take 2 g by mouth daily.    . Flaxseed, Linseed, (FLAX SEED OIL PO) Take 1 capsule by mouth daily.     . fluconazole (DIFLUCAN) 150 MG tablet Take 1 tablet (150 mg total) by mouth once. Take one tablet.  Repeat in 48-72 if  needed 2 tablet 1  . ibuprofen (ADVIL,MOTRIN) 200 MG tablet Take 200 mg by mouth every 6 (six) hours as needed for moderate pain.    . Multiple Vitamins-Minerals (MULTIVITAMIN WITH MINERALS) tablet Take 1 tablet by mouth daily.    . naproxen sodium (ANAPROX) 220 MG tablet Take 220 mg by mouth daily.     . Plant Sterols and Stanols (CHOLESTOFF PO) Take by mouth daily.     . ranitidine (ZANTAC) 75 MG tablet Take 75 mg by mouth at bedtime as needed for heartburn.    . vitamin C (ASCORBIC ACID) 500 MG tablet Take 500 mg by mouth daily.     No current facility-administered medications for this visit.     ALLERGIES: Ofloxacin  Family History  Problem Relation Age of Onset  . Breast cancer Mother   . Heart disease Mother   . Hyperlipidemia Mother   . Thyroid  disease Mother   . Osteoporosis Mother   . Dementia Mother   . Diabetes Father   . Heart disease Father   . Cancer Father     prostate  . Cancer Brother     Eye    Social History   Social History  . Marital Status: Married    Spouse Name: N/A  . Number of Children: N/A  . Years of Education: N/A   Occupational History  . Not on file.   Social History Main Topics  . Smoking status: Never Smoker   . Smokeless tobacco: Never Used  . Alcohol Use: 0.6 oz/week    1 Standard drinks or equivalent per week     Comment: glass of wine  . Drug Use: No  . Sexual Activity:    Partners: Male    Birth Control/ Protection: None   Other Topics Concern  . Not on file   Social History Narrative    ROS  PHYSICAL EXAMINATION:    BP 120/80 mmHg  Pulse 72  Resp 14  Ht 5' 5.25" (1.657 m)  Wt 152 lb 3.2 oz (69.037 kg)  BMI 25.14 kg/m2    General appearance: alert, cooperative and appears stated age Abdomen: soft, non-tender; bowel sounds normal; no masses,  no organomegaly CVA: not tender   ASSESSMENT Dysuria, urgency, frequency. Symptoms c/w cystitis She was recently treated with macrobid x 1 week    PLAN Will treat with bactrim, she requests 1 week treatment She will take over the counter uristat x 48 hours Send urine for ua, c&s If she has recurrent infections, we discussed the options of using antibiotics as needed with intercourse   An After Visit Summary was printed and given to the patient.

## 2015-10-28 NOTE — Patient Instructions (Signed)

## 2015-10-30 LAB — URINE CULTURE

## 2015-10-31 ENCOUNTER — Telehealth: Payer: Self-pay | Admitting: Emergency Medicine

## 2015-10-31 IMAGING — MG MM SCREENING BREAST TOMO BILATERAL
12 of 16 series · 12 of 32 positions shown · non-contrast
Comparison: Previous exam(s).

CLINICAL DATA: Screening.

EXAM:
DIGITAL SCREENING BILATERAL MAMMOGRAM WITH 3D TOMO WITH CAD

[L CC synth-2D]
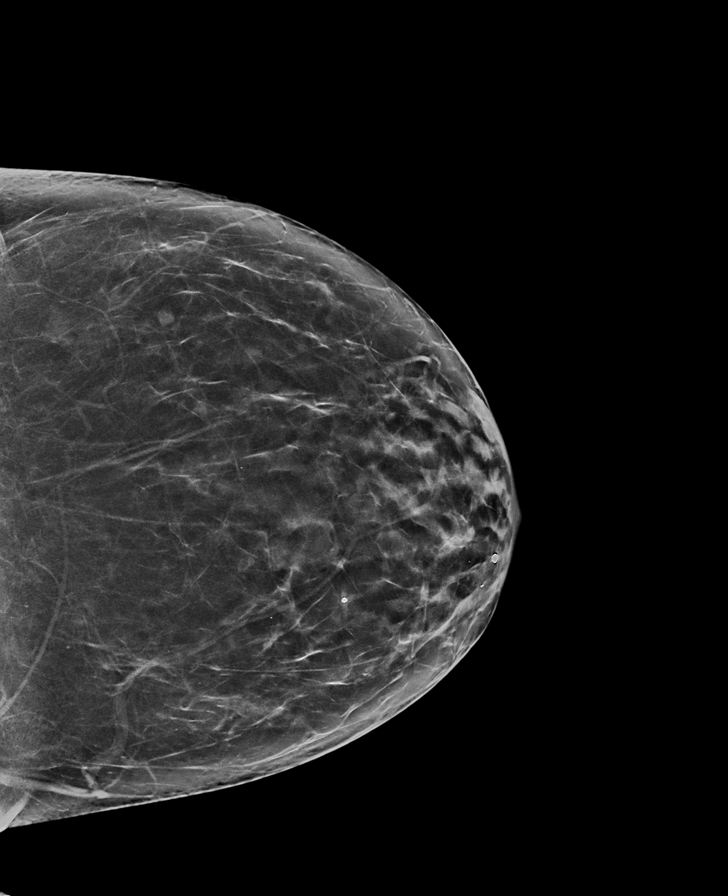

[R MLO synth-2D]
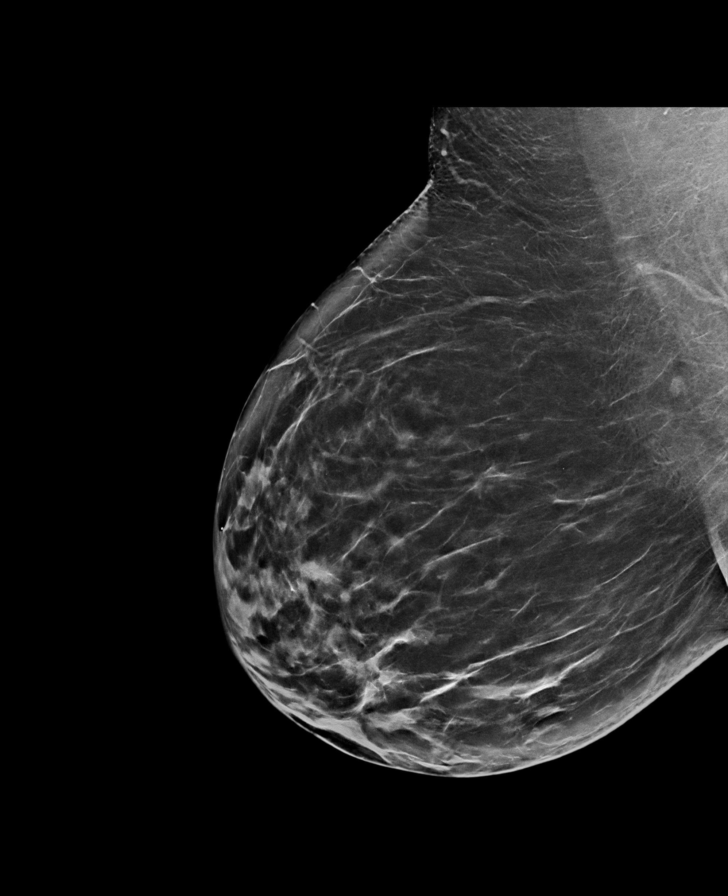

[L CC]
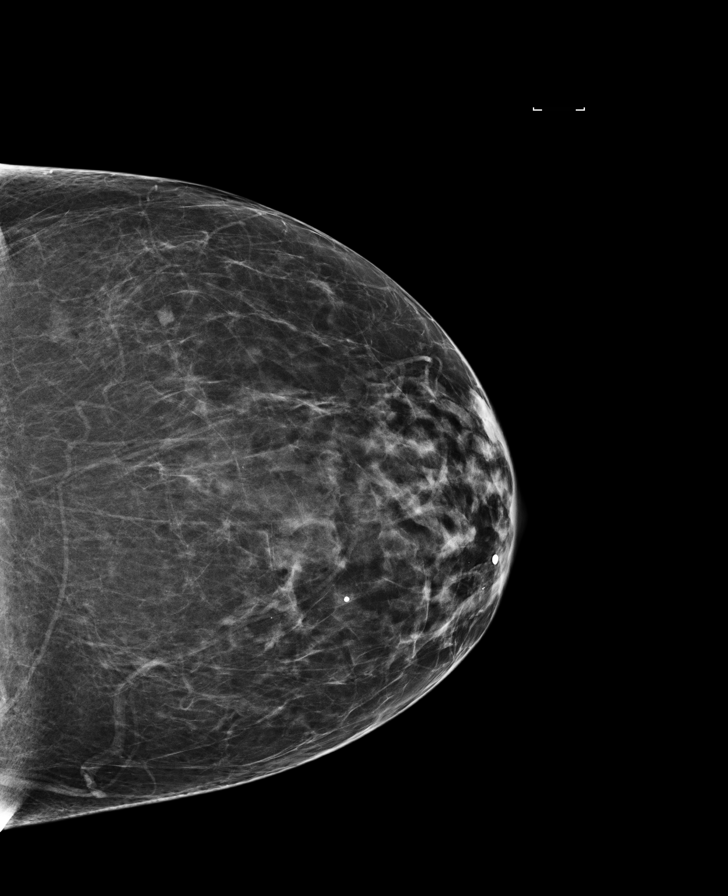

[R CC]
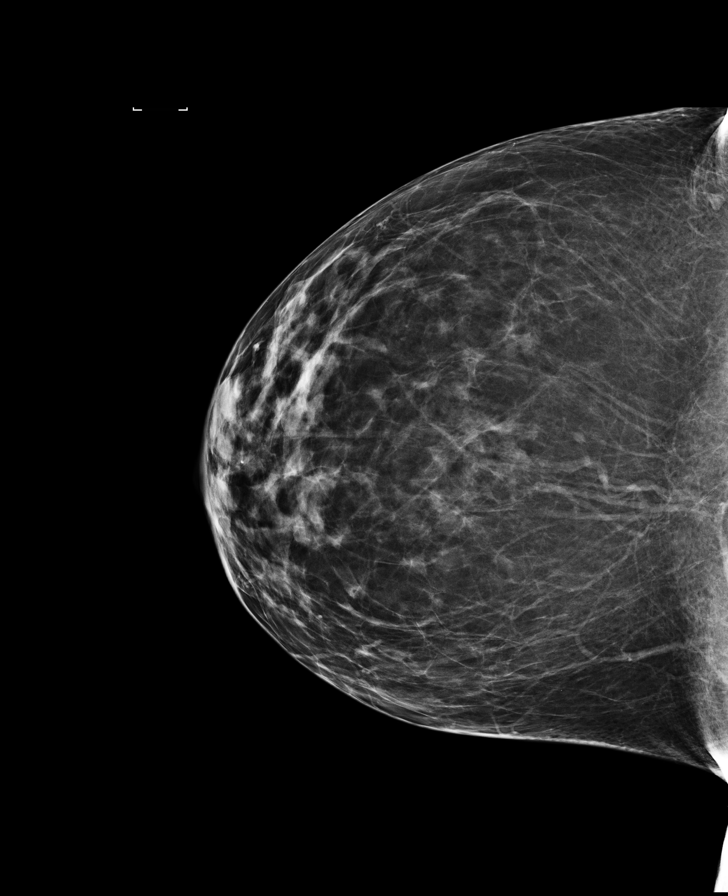

[L MLO]
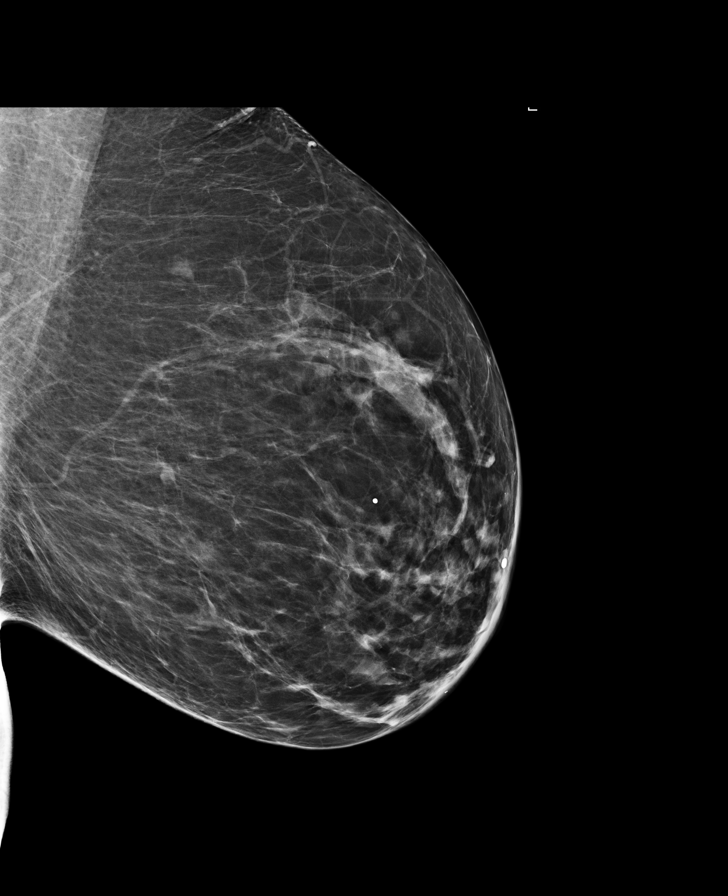

[R CC synth-2D]
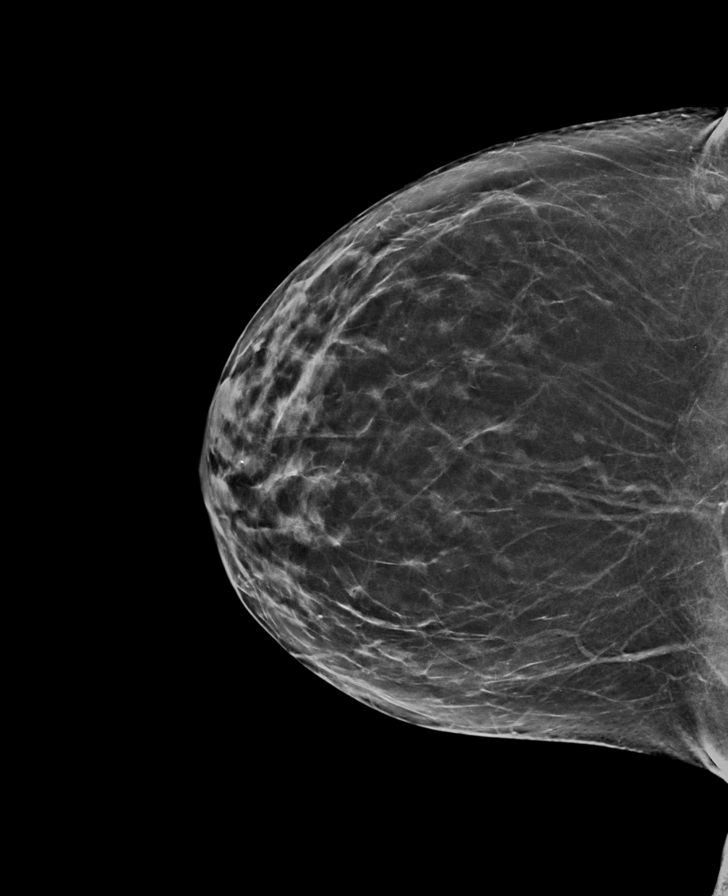

[L MLO synth-2D]
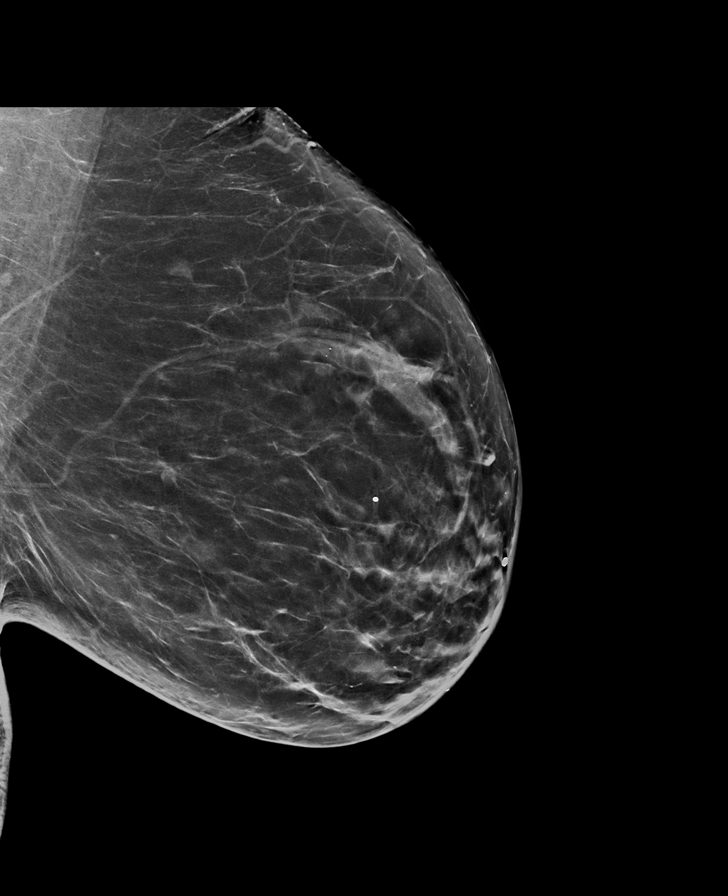

[R MLO]
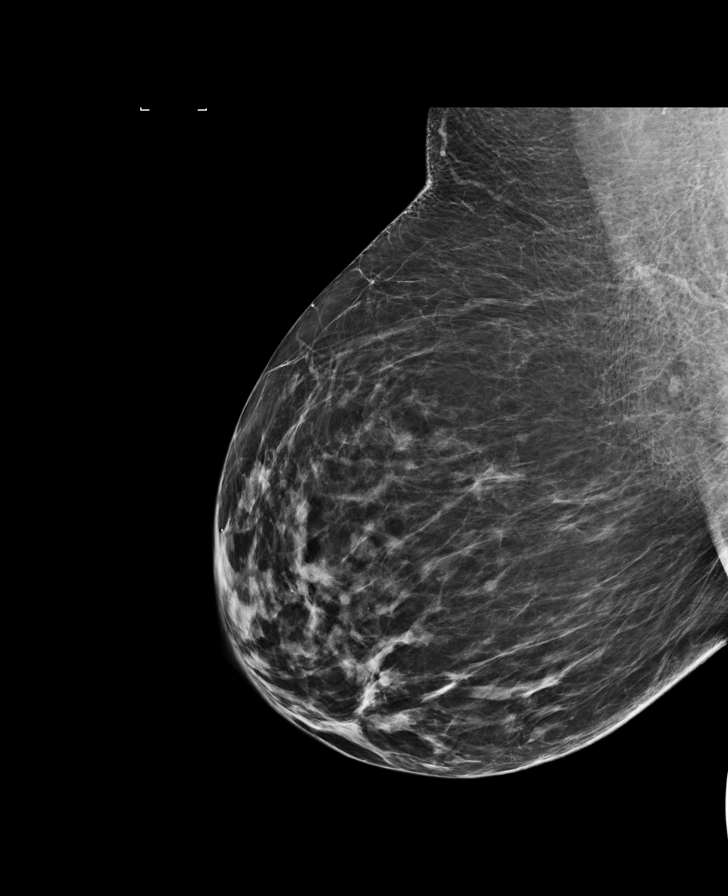

[L CC tomo]
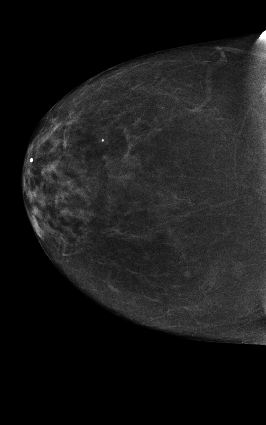

[R MLO tomo]
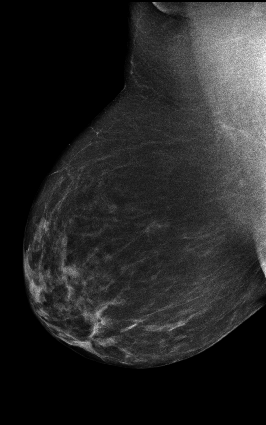

[R CC tomo]
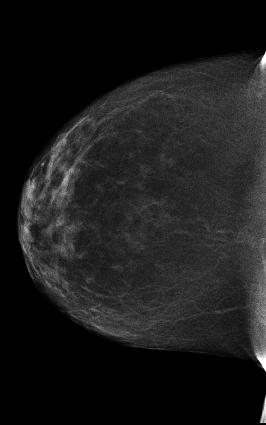

[L MLO tomo]
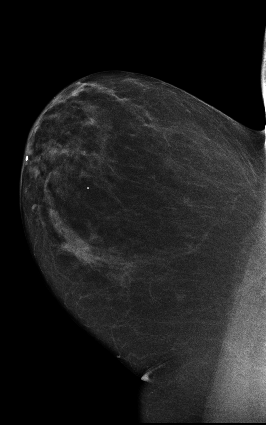

[12 of 32 positions shown; findings below may reference images not displayed]

ACR Breast Density Category b: There are scattered areas of
fibroglandular density.
FINDINGS: There are no findings suspicious for malignancy. Images were
processed with CAD.
IMPRESSION: No mammographic evidence of malignancy. A result letter of this
screening mammogram will be mailed directly to the patient.

RECOMMENDATION:
Screening mammogram in one year. (Code:55-L-23V)

BI-RADS CATEGORY  1: Negative.

## 2015-10-31 NOTE — Addendum Note (Signed)
Addended by: Megan Salon on: 10/31/2015 05:15 AM   Modules accepted: Orders

## 2015-10-31 NOTE — Telephone Encounter (Deleted)
-----   Message from Megan Salon, MD sent at 10/31/2015  5:14 AM EDT ----- Please call and let pt know urine culture is still positive for klebsiella.  Was treated with macrobid.  Would like to treat with Bactrim DS bid x 5 days.  Rx has not been sent to pharmacy.  Repeat urine culture in two weeks.  Order has been placed for urine culture.  If still positive, will refer to urology.

## 2015-11-03 NOTE — Telephone Encounter (Signed)
Patient says she is returning a call from 10/31/15 ?

## 2015-11-03 NOTE — Telephone Encounter (Signed)
Spoke with patient -see result note -eh 

## 2015-11-03 NOTE — Telephone Encounter (Signed)
I called patient this morning in regards to needing another U/C Called and left patient another message-eh

## 2015-11-06 DIAGNOSIS — L304 Erythema intertrigo: Secondary | ICD-10-CM | POA: Diagnosis not present

## 2015-11-07 ENCOUNTER — Ambulatory Visit
Admission: RE | Admit: 2015-11-07 | Discharge: 2015-11-07 | Disposition: A | Discharge status: Home / Self Care | Payer: Medicare Other | Source: Ambulatory Visit

## 2015-11-07 ENCOUNTER — Ambulatory Visit
Admission: RE | Admit: 2015-11-07 | Discharge: 2015-11-07 | Disposition: A | Discharge status: Home / Self Care | Payer: Medicare Other | Source: Ambulatory Visit | Attending: Obstetrics & Gynecology | Admitting: Obstetrics & Gynecology

## 2015-11-07 DIAGNOSIS — Z1231 Encounter for screening mammogram for malignant neoplasm of breast: Secondary | ICD-10-CM | POA: Diagnosis not present

## 2015-11-07 DIAGNOSIS — M8580 Other specified disorders of bone density and structure, unspecified site: Secondary | ICD-10-CM

## 2015-11-07 DIAGNOSIS — Z780 Asymptomatic menopausal state: Secondary | ICD-10-CM | POA: Diagnosis not present

## 2015-11-07 DIAGNOSIS — M810 Age-related osteoporosis without current pathological fracture: Secondary | ICD-10-CM | POA: Diagnosis not present

## 2015-11-07 DIAGNOSIS — E2839 Other primary ovarian failure: Secondary | ICD-10-CM

## 2015-11-10 ENCOUNTER — Ambulatory Visit (INDEPENDENT_AMBULATORY_CARE_PROVIDER_SITE_OTHER): Payer: Medicare Other

## 2015-11-10 DIAGNOSIS — N3090 Cystitis, unspecified without hematuria: Secondary | ICD-10-CM | POA: Diagnosis not present

## 2015-11-10 NOTE — Progress Notes (Signed)
Patient in office for a TOC U/C ordered by Dr. Sabra Heck. Patient states that she is feeling better and is asymptomatic. Completed course of antibiotics. Urine obtained and sent for culture. Patient had no further questions or concerns. Patient is Dr. Ammie Ferrier. Routing to Dr. Quincy Simmonds in the absence of Dr. Sabra Heck.

## 2015-11-11 LAB — URINE CULTURE

## 2015-11-11 NOTE — Progress Notes (Signed)
Encounter reviewed by Dr. Robyne Matar Amundson C. Silva.  

## 2015-11-13 ENCOUNTER — Telehealth: Payer: Self-pay | Admitting: *Deleted

## 2015-11-13 NOTE — Telephone Encounter (Signed)
-----   Message from Megan Salon, MD sent at 11/09/2015  7:44 AM EDT ----- Please call pt and let her know that her hip measurement is now in early osteoporosis range.  She needs a consultation to discuss and talk about options  Thanks.

## 2015-11-13 NOTE — Telephone Encounter (Signed)
Left message for patient to call back to review BMD results

## 2015-11-17 DIAGNOSIS — M5431 Sciatica, right side: Secondary | ICD-10-CM | POA: Diagnosis not present

## 2015-11-18 NOTE — Telephone Encounter (Signed)
Returning a call to Emily. °

## 2015-11-18 NOTE — Telephone Encounter (Signed)
Returned call to patient and gave her BMD results. Patient agreeable to consultation with Dr. Sabra Heck to discuss options. Appointment scheduled for Thursday 12/04/2015 at 1345. Patient agreeable to date and time.

## 2015-11-18 NOTE — Telephone Encounter (Signed)
-----   Message from Megan Salon, MD sent at 11/09/2015  7:44 AM EDT ----- Please call pt and let her know that her hip measurement is now in early osteoporosis range.  She needs a consultation to discuss and talk about options  Thanks.

## 2015-12-04 ENCOUNTER — Ambulatory Visit (INDEPENDENT_AMBULATORY_CARE_PROVIDER_SITE_OTHER): Payer: Medicare Other | Admitting: Obstetrics & Gynecology

## 2015-12-04 ENCOUNTER — Encounter: Payer: Self-pay | Admitting: Obstetrics & Gynecology

## 2015-12-04 VITALS — BP 134/76 | HR 76 | Resp 16 | Ht 65.25 in | Wt 151.0 lb

## 2015-12-04 DIAGNOSIS — M810 Age-related osteoporosis without current pathological fracture: Secondary | ICD-10-CM | POA: Insufficient documentation

## 2015-12-04 DIAGNOSIS — M818 Other osteoporosis without current pathological fracture: Secondary | ICD-10-CM | POA: Diagnosis not present

## 2015-12-04 LAB — COMPREHENSIVE METABOLIC PANEL
ALBUMIN: 4 g/dL (ref 3.6–5.1)
ALT: 19 U/L (ref 6–29)
AST: 20 U/L (ref 10–35)
Alkaline Phosphatase: 69 U/L (ref 33–130)
BUN: 17 mg/dL (ref 7–25)
CALCIUM: 9.5 mg/dL (ref 8.6–10.4)
CHLORIDE: 106 mmol/L (ref 98–110)
CO2: 27 mmol/L (ref 20–31)
Creat: 0.75 mg/dL (ref 0.50–0.99)
Glucose, Bld: 83 mg/dL (ref 65–99)
POTASSIUM: 4.5 mmol/L (ref 3.5–5.3)
Sodium: 141 mmol/L (ref 135–146)
TOTAL PROTEIN: 6.4 g/dL (ref 6.1–8.1)
Total Bilirubin: 0.3 mg/dL (ref 0.2–1.2)

## 2015-12-04 LAB — TSH: TSH: 0.81 m[IU]/L

## 2015-12-04 NOTE — Progress Notes (Signed)
Subjective:    21 yrs Married Caucasian G2P2  female here to discuss recent BMD obtained 11/07/15 showing osteoporosis in her right hip with t score -2.6.  Spine measurements were not included due to arthritis.  Forearm measurements showed t score -1.1.  Pt is a lifelong walker and has never broken a bone.  She loves dairy.  She has chosen to continue on HRT 3 times weekly as well.  States she declines any medical therapy at this time due to reflux and desire not to add any medications.  Osteoporosis Risk Factors  Nonmodifiable Personal Hx of fracture as an adult: no Hx of fracture in first-degree relative: no Caucasian race: yes Advanced age: yes Dementia: no Poor health/frailty: no  Potentially modifiable: Tobacco use: no Low body weight (<127 lbs): no Estrogen deficiency  early menopause (age <45) or bilateral ovariectomy: no Low calcium intake (lifelong): no Alcohol use more than 2 drinks per day: no Recurrent falls: no Inadequate physical activity: no  Current calcium and Vit D intake:  Os cal 600mg  with Vit D twice weeklky  Review of Systems Pertinent items noted in HPI and remainder of comprehensive ROS otherwise negative.     Objective:   PHYSICAL EXAM BP 134/76 (BP Location: Right Arm, Patient Position: Sitting, Cuff Size: Normal)   Pulse 76   Resp 16   Ht 5' 5.25" (1.657 m)   Wt 151 lb (68.5 kg)   BMI 24.94 kg/m  General appearance: alert, cooperative and no distress  Imaging Bone Density: Spine T Score: not calculated, Hip T Score: -2.6 on right.  Left hip is much better than the left with only mild osteopenia present FRAX score:  Not calculated                                    Discussion:  Given the discrepency between the two hip measurements, feel it is ok to monitor this.  I do think seeing a trainer to improved weight bearing on the right side could help.  Will r/o secondary causes.  Pt in agreement with plan.  Assessment:   Osteoporosis on the  left hip only with T score -2.6   Plan:   1.  PTH, CMP, TSH, Vit D today 2.  Continue calcium and Vit D 3.  Repeat BMD 3 years  ~20 minutes spent with patient >50% of time was in face to face discussion of above.

## 2015-12-05 LAB — PARATHYROID HORMONE, INTACT (NO CA): PTH: 25 pg/mL (ref 14–64)

## 2015-12-05 LAB — VITAMIN D 25 HYDROXY (VIT D DEFICIENCY, FRACTURES): Vit D, 25-Hydroxy: 40 ng/mL (ref 30–100)

## 2015-12-16 DIAGNOSIS — M5431 Sciatica, right side: Secondary | ICD-10-CM | POA: Diagnosis not present

## 2016-02-16 DIAGNOSIS — Z23 Encounter for immunization: Secondary | ICD-10-CM | POA: Diagnosis not present

## 2016-02-26 DIAGNOSIS — Z1211 Encounter for screening for malignant neoplasm of colon: Secondary | ICD-10-CM | POA: Diagnosis not present

## 2016-03-10 DIAGNOSIS — H35373 Puckering of macula, bilateral: Secondary | ICD-10-CM | POA: Diagnosis not present

## 2016-03-10 DIAGNOSIS — H40013 Open angle with borderline findings, low risk, bilateral: Secondary | ICD-10-CM | POA: Diagnosis not present

## 2016-03-10 DIAGNOSIS — H25012 Cortical age-related cataract, left eye: Secondary | ICD-10-CM | POA: Diagnosis not present

## 2016-03-10 DIAGNOSIS — H2512 Age-related nuclear cataract, left eye: Secondary | ICD-10-CM | POA: Diagnosis not present

## 2016-04-20 DIAGNOSIS — H25012 Cortical age-related cataract, left eye: Secondary | ICD-10-CM | POA: Diagnosis not present

## 2016-04-20 DIAGNOSIS — H25812 Combined forms of age-related cataract, left eye: Secondary | ICD-10-CM | POA: Diagnosis not present

## 2016-04-20 DIAGNOSIS — H2512 Age-related nuclear cataract, left eye: Secondary | ICD-10-CM | POA: Diagnosis not present

## 2016-05-17 DIAGNOSIS — E7800 Pure hypercholesterolemia, unspecified: Secondary | ICD-10-CM | POA: Diagnosis not present

## 2016-05-17 DIAGNOSIS — M810 Age-related osteoporosis without current pathological fracture: Secondary | ICD-10-CM | POA: Diagnosis not present

## 2016-05-17 DIAGNOSIS — Z23 Encounter for immunization: Secondary | ICD-10-CM | POA: Diagnosis not present

## 2016-05-17 DIAGNOSIS — Z131 Encounter for screening for diabetes mellitus: Secondary | ICD-10-CM | POA: Diagnosis not present

## 2016-05-17 DIAGNOSIS — Z0000 Encounter for general adult medical examination without abnormal findings: Secondary | ICD-10-CM | POA: Diagnosis not present

## 2016-08-25 DIAGNOSIS — L603 Nail dystrophy: Secondary | ICD-10-CM | POA: Diagnosis not present

## 2016-08-25 DIAGNOSIS — D225 Melanocytic nevi of trunk: Secondary | ICD-10-CM | POA: Diagnosis not present

## 2016-08-25 DIAGNOSIS — Q279 Congenital malformation of peripheral vascular system, unspecified: Secondary | ICD-10-CM | POA: Diagnosis not present

## 2016-08-25 DIAGNOSIS — L304 Erythema intertrigo: Secondary | ICD-10-CM | POA: Diagnosis not present

## 2016-08-25 DIAGNOSIS — L814 Other melanin hyperpigmentation: Secondary | ICD-10-CM | POA: Diagnosis not present

## 2016-08-25 DIAGNOSIS — D1800 Hemangioma unspecified site: Secondary | ICD-10-CM | POA: Diagnosis not present

## 2016-08-25 DIAGNOSIS — L821 Other seborrheic keratosis: Secondary | ICD-10-CM | POA: Diagnosis not present

## 2016-08-25 DIAGNOSIS — L988 Other specified disorders of the skin and subcutaneous tissue: Secondary | ICD-10-CM | POA: Diagnosis not present

## 2016-09-13 DIAGNOSIS — H04123 Dry eye syndrome of bilateral lacrimal glands: Secondary | ICD-10-CM | POA: Diagnosis not present

## 2016-09-13 DIAGNOSIS — H40013 Open angle with borderline findings, low risk, bilateral: Secondary | ICD-10-CM | POA: Diagnosis not present

## 2016-09-13 DIAGNOSIS — H5231 Anisometropia: Secondary | ICD-10-CM | POA: Diagnosis not present

## 2016-09-13 DIAGNOSIS — H5703 Miosis: Secondary | ICD-10-CM | POA: Diagnosis not present

## 2016-09-28 ENCOUNTER — Other Ambulatory Visit: Payer: Self-pay | Admitting: Obstetrics & Gynecology

## 2016-09-28 DIAGNOSIS — Z1231 Encounter for screening mammogram for malignant neoplasm of breast: Secondary | ICD-10-CM

## 2016-11-05 DIAGNOSIS — N390 Urinary tract infection, site not specified: Secondary | ICD-10-CM | POA: Diagnosis not present

## 2016-11-05 DIAGNOSIS — R350 Frequency of micturition: Secondary | ICD-10-CM | POA: Diagnosis not present

## 2016-11-08 ENCOUNTER — Ambulatory Visit
Admission: RE | Admit: 2016-11-08 | Discharge: 2016-11-08 | Disposition: A | Discharge status: Home / Self Care | Payer: Medicare Other | Source: Ambulatory Visit | Attending: Obstetrics & Gynecology | Admitting: Obstetrics & Gynecology

## 2016-11-08 DIAGNOSIS — Z1231 Encounter for screening mammogram for malignant neoplasm of breast: Secondary | ICD-10-CM

## 2016-11-25 DIAGNOSIS — L309 Dermatitis, unspecified: Secondary | ICD-10-CM | POA: Diagnosis not present

## 2016-12-15 DIAGNOSIS — R3915 Urgency of urination: Secondary | ICD-10-CM | POA: Diagnosis not present

## 2016-12-29 DIAGNOSIS — H35362 Drusen (degenerative) of macula, left eye: Secondary | ICD-10-CM | POA: Diagnosis not present

## 2016-12-29 DIAGNOSIS — H40013 Open angle with borderline findings, low risk, bilateral: Secondary | ICD-10-CM | POA: Diagnosis not present

## 2016-12-29 DIAGNOSIS — H35373 Puckering of macula, bilateral: Secondary | ICD-10-CM | POA: Diagnosis not present

## 2016-12-29 DIAGNOSIS — H35033 Hypertensive retinopathy, bilateral: Secondary | ICD-10-CM | POA: Diagnosis not present

## 2017-01-19 NOTE — Progress Notes (Signed)
71 y.o. G2P2 Married Caucasian F here for annual exam.    H/O UTIs that have persisted over the summer.  Went to PCP in June.  Has culture positive UTI.  Took Macrobid 100mg  bid x 10 days.  Symptoms seemed to persist but she waited about another month before she went back for follow-up.  Repeat culture was positive.  Was started on Keflex for about a week.  Did not have a repeat culture for TOC.    PCP:  Dr. Harrington Challenger.  Pt reports cholesterol is mildly elevated.    No LMP recorded. Patient has had a hysterectomy.          Sexually active: Yes.    The current method of family planning is status post hysterectomy.  Exercising: Yes.    cardio, strengthening Smoker:  no  Health Maintenance: Pap:  11/19/09 WNL  History of abnormal Pap:  no MMG:  11/08/16 BIRADS 1 negative  Colonoscopy:  02/2016 with Dr. Watt Climes BMD:   11/07/15 osteoporosis.   TDaP:  <10 years  Pneumonia vaccine(s):  PCP Zostavax:   PCP Hep C testing: 10/07/15 negative  Screening Labs: PCP, Hb today: PCP, Urine today:    reports that she has never smoked. She has never used smokeless tobacco. She reports that she drinks about 0.6 oz of alcohol per week . She reports that she does not use drugs.  Past Medical History:  Diagnosis Date  . Contact lens/glasses fitting    wears one contact lt eye  . Heterozygous factor V Leiden mutation ([place])   . Osteopenia   . PONV (postoperative nausea and vomiting)   . Superficial vein thrombosis 1997    Past Surgical History:  Procedure Laterality Date  . BARTHOLIN CYST MARSUPIALIZATION    . BARTHOLIN CYST MARSUPIALIZATION Right 08/19/2014   Procedure: BARTHOLIN CYST MARSUPIALIZATION;  Surgeon: Megan Salon, MD;  Location: [facility];  Service: Gynecology;  Laterality: Right;  . CARPAL TUNNEL RELEASE Right 12/20/2012   Procedure: CARPAL TUNNEL RELEASE;  Surgeon: Wynonia Sours, MD;  Location: [facility];  Service: Orthopedics;  Laterality: Right;  . CATARACT EXTRACTION Right 2009       . [place]   x2  . EAR CYST EXCISION Left 08/15/2014   Procedure: LEFT THUMB DEBRIDEMENT INTERPHALANGEAL JOINT LEFT THUMB REMOVAL MUCOID CYST ;  Surgeon: Daryll Brod, MD;  Location: [facility];  Service: Orthopedics;  Laterality: Left;  . FINGER SURGERY  2002   lt long finger mass  . GANGLION CYST EXCISION Left 2/02  . TOTAL ABDOMINAL HYSTERECTOMY W/ BILATERAL SALPINGOOPHORECTOMY  1997  . TUBAL LIGATION  1977    Current Outpatient Prescriptions  Medication Sig Dispense Refill  . acetaminophen (TYLENOL) 500 MG tablet Take 500 mg by mouth every 4 (four) hours as needed for mild pain.    Marland Kitchen aspirin 81 MG tablet Take 81 mg by mouth daily.    . calcium carbonate (OS-CAL) 600 MG TABS tablet Take 600 mg by mouth 2 (two) times daily with a meal.    . estradiol (ESTRACE) 1 MG tablet Take 1 tablet (1 mg total) by mouth daily. 30 tablet 12  . fish oil-omega-3 fatty acids 1000 MG capsule Take 2 g by mouth daily.    . Flaxseed, Linseed, (FLAX SEED OIL PO) Take 1 capsule by mouth daily.     Marland Kitchen ibuprofen (ADVIL,MOTRIN) 200 MG tablet Take 200 mg by mouth every 6 (six) hours as needed for moderate  pain.    . Multiple Vitamins-Minerals (MULTIVITAMIN WITH MINERALS) tablet Take 1 tablet by mouth daily.    . naproxen sodium (ANAPROX) 220 MG tablet Take 220 mg by mouth daily.     . Plant Sterols and Stanols (CHOLESTOFF PO) Take by mouth daily.     Marland Kitchen pyridOXINE (VITAMIN B-6) 50 MG tablet Take 50 mg by mouth daily.    . ranitidine (ZANTAC) 75 MG tablet Take 75 mg by mouth at bedtime as needed for heartburn.    . vitamin C (ASCORBIC ACID) 500 MG tablet Take 500 mg by mouth daily.     No current facility-administered medications for this visit.     Family History  Problem Relation Age of Onset  . Breast cancer Mother   . Heart disease Mother   . Hyperlipidemia Mother   . Thyroid disease Mother   . Osteoporosis Mother   . Dementia Mother   . Diabetes Father   . Heart  disease Father   . Cancer Father        prostate  . Cancer Brother        Eye    ROS:  Pertinent items are noted in HPI.  Otherwise, a comprehensive ROS was negative.  Exam:   BP 122/80 (BP Location: Right Arm, Patient Position: Sitting, Cuff Size: Normal)   Pulse 64   Resp 14   Ht 5\' 6"  (1.676 m)   Wt 152 lb (68.9 kg)   BMI 24.53 kg/m     Height: 5\' 6"  (167.6 cm)  Ht Readings from Last 3 Encounters:  01/20/17 5\' 6"  (1.676 m)  12/04/15 5' 5.25" (1.657 m)  11/10/15 5' 5.25" (1.657 m)    General appearance: alert, cooperative and appears stated age Head: Normocephalic, without obvious abnormality, atraumatic Neck: no adenopathy, supple, symmetrical, trachea midline and thyroid normal to inspection and palpation Lungs: clear to auscultation bilaterally Breasts: normal appearance, no masses or tenderness Heart: regular rate and rhythm Abdomen: soft, non-tender; bowel sounds normal; no masses,  no organomegaly Extremities: extremities normal, atraumatic, no cyanosis or edema Skin: Skin color, texture, turgor normal. No rashes or lesions Lymph nodes: Cervical, supraclavicular, and axillary nodes normal. No abnormal inguinal nodes palpated Neurologic: Grossly normal   Pelvic: External genitalia:  no lesions              Urethra:  normal appearing urethra with no masses, tenderness or lesions              Bartholins and Skenes: normal                 Vagina: normal appearing vagina with normal color and discharge, no lesions              Cervix: absent              Pap taken: No. Bimanual Exam:  Uterus:  uterus absent              Adnexa: no mass, fullness, tenderness               Rectovaginal: Confirms               Anus:  normal sphincter tone, no lesions  Chaperone was present for exam.  A:  Well Woman with normal exam Recurrent UTIs H/o factor V Leiden heterozygous (saw Dr. Marin Olp once who advised HRT was ok) H/O TAH/BSO (done due to bleeding) H/O depression H/O  recurrent UTIs with current symptoms H/O bilateral Bartholin gland  marsupializations done at separate dates, no current issues Osteoporosis, declines treatment currently  P:   Mammogram guidelines reviewed.  Doing yearly pap smear not indicated Repeat BMD 1 year. Urine culture pending.  Bactrim DS BID x 5 days sent to pharmacy on file.  Will plan TOC.  May need referral to urology due to recurrent UTIs.   Rx for shingrix vaccination given Recommended weaning off HRT.  Will decreased to 0.25mg  dosage daily.  Pt has been taking 0.5mg  every other days.  No rx needed at this time as she will use what she has and cut.   return annually or prn

## 2017-01-20 ENCOUNTER — Encounter: Payer: Self-pay | Admitting: Obstetrics & Gynecology

## 2017-01-20 ENCOUNTER — Ambulatory Visit (INDEPENDENT_AMBULATORY_CARE_PROVIDER_SITE_OTHER): Payer: Medicare Other | Admitting: Obstetrics & Gynecology

## 2017-01-20 VITALS — BP 122/80 | HR 64 | Resp 14 | Ht 66.0 in | Wt 152.0 lb

## 2017-01-20 DIAGNOSIS — Z01419 Encounter for gynecological examination (general) (routine) without abnormal findings: Secondary | ICD-10-CM

## 2017-01-20 DIAGNOSIS — N3090 Cystitis, unspecified without hematuria: Secondary | ICD-10-CM

## 2017-01-20 MED ORDER — SULFAMETHOXAZOLE-TRIMETHOPRIM 800-160 MG PO TABS: 1.0000 | ORAL_TABLET | Freq: Two times a day (BID) | ORAL | 0 refills | Status: DC

## 2017-01-22 LAB — URINE CULTURE

## 2017-01-23 NOTE — Addendum Note (Signed)
Addended by: Megan Salon on: 01/23/2017 08:02 AM   Modules accepted: Orders

## 2017-01-24 ENCOUNTER — Telehealth: Payer: Self-pay | Admitting: *Deleted

## 2017-01-24 NOTE — Telephone Encounter (Signed)
-----   Message from Megan Salon, MD sent at 01/23/2017  8:01 AM EDT ----- Please let pt know her urine culture did show e coli and bactrim should have worked for this.  Needs repeat culture in two weeks from last visit.  Order placed.

## 2017-01-24 NOTE — Telephone Encounter (Signed)
Call to patient. Results reviewed with patient and she verbalized understanding. Repeat UC scheduled for 02/07/17 at 0900 as patient will be out of town 2 weeks from last visit. RN advised patient she will mail prescription for shingles vaccine. Patient verbalized understanding. Patient states at her aex, Dr. Sabra Heck told her to decrease her dosage of HRT, but patient asking how long she needs to take the 0.25mg  dosage. Dr. Sabra Heck- please advise. Patient aware RN will return call with additional recommendations from Dr. Sabra Heck.  Routing to provider for review.

## 2017-01-24 NOTE — Telephone Encounter (Signed)
I'd like her on this for about two months to see if she's going to have any symptoms.  Could she please give me an update after two months?  Thanks.

## 2017-01-25 NOTE — Telephone Encounter (Signed)
Patient requesting to speak with you again about earlier conversation.

## 2017-01-25 NOTE — Telephone Encounter (Signed)
Call to patient. Message given to patient as seen below from Dr. Sabra Heck. Patient verbalized understanding and aware to call in two months to give update. Patient states she will need a refill and requests prescription be sent to [facility] in [place]. Dr. Sabra Heck- please advise on refill of estradiol.   Routing to provider for review.

## 2017-01-25 NOTE — Telephone Encounter (Signed)
I think it's fine not to have another refill.  Thanks.

## 2017-01-25 NOTE — Telephone Encounter (Signed)
Returned call to patient. Patient states she counted up her estradiol pills and states she has enough to last her through 03/15/17. Patient asking if that is close enough to two months to not need a refill, or if she needs "just a few more pills" prescribed to get her to exactly two months. RN advised will review with Dr. Sabra Heck and return call to patient. Patient agreeable.   Routing to provider for review.

## 2017-01-27 NOTE — Telephone Encounter (Signed)
Call to patient. Message given to patient as seen below from Dr. Sabra Heck. Patient verbalized understanding. Patient will call after she finishes estradiol and give update.  Patient agreeable to disposition. Will close encounter.

## 2017-02-07 ENCOUNTER — Ambulatory Visit (INDEPENDENT_AMBULATORY_CARE_PROVIDER_SITE_OTHER): Payer: Medicare Other

## 2017-02-07 DIAGNOSIS — N3090 Cystitis, unspecified without hematuria: Secondary | ICD-10-CM | POA: Diagnosis not present

## 2017-02-07 NOTE — Progress Notes (Signed)
Patient in office for repeat urine culture. Patient states that she is feeling better and has completed course of antibiotics. Urine sample given and sent to the lab for culture. Routing to provider for final review. Patient agreeable to disposition. Will close encounter.

## 2017-02-08 LAB — URINE CULTURE

## 2017-02-09 ENCOUNTER — Telehealth: Payer: Self-pay

## 2017-02-09 NOTE — Telephone Encounter (Signed)
Spoke with patient. Results given. Patient verbalizes understanding. Denies any symptoms. Encounter closed.

## 2017-02-09 NOTE — Telephone Encounter (Signed)
-----   Message from Megan Salon, MD sent at 02/09/2017  8:11 AM EDT ----- Please let pt know her urine culture is negative.  Have her symptoms resolved?

## 2017-03-02 DIAGNOSIS — Z23 Encounter for immunization: Secondary | ICD-10-CM | POA: Diagnosis not present

## 2017-04-06 DIAGNOSIS — H00022 Hordeolum internum right lower eyelid: Secondary | ICD-10-CM | POA: Diagnosis not present

## 2017-04-13 DIAGNOSIS — M79641 Pain in right hand: Secondary | ICD-10-CM | POA: Diagnosis not present

## 2017-06-07 DIAGNOSIS — Z131 Encounter for screening for diabetes mellitus: Secondary | ICD-10-CM | POA: Diagnosis not present

## 2017-06-07 DIAGNOSIS — E7800 Pure hypercholesterolemia, unspecified: Secondary | ICD-10-CM | POA: Diagnosis not present

## 2017-06-07 DIAGNOSIS — M810 Age-related osteoporosis without current pathological fracture: Secondary | ICD-10-CM | POA: Diagnosis not present

## 2017-06-09 DIAGNOSIS — Z0000 Encounter for general adult medical examination without abnormal findings: Secondary | ICD-10-CM | POA: Diagnosis not present

## 2017-06-09 DIAGNOSIS — Z131 Encounter for screening for diabetes mellitus: Secondary | ICD-10-CM | POA: Diagnosis not present

## 2017-06-09 DIAGNOSIS — M810 Age-related osteoporosis without current pathological fracture: Secondary | ICD-10-CM | POA: Diagnosis not present

## 2017-06-09 DIAGNOSIS — E7800 Pure hypercholesterolemia, unspecified: Secondary | ICD-10-CM | POA: Diagnosis not present

## 2017-07-09 DIAGNOSIS — S7001XA Contusion of right hip, initial encounter: Secondary | ICD-10-CM | POA: Diagnosis not present

## 2017-07-09 DIAGNOSIS — W108XXA Fall (on) (from) other stairs and steps, initial encounter: Secondary | ICD-10-CM | POA: Diagnosis not present

## 2017-10-21 ENCOUNTER — Other Ambulatory Visit: Payer: Self-pay | Admitting: Obstetrics & Gynecology

## 2017-10-21 DIAGNOSIS — Z1231 Encounter for screening mammogram for malignant neoplasm of breast: Secondary | ICD-10-CM

## 2017-11-16 ENCOUNTER — Ambulatory Visit
Admission: RE | Admit: 2017-11-16 | Discharge: 2017-11-16 | Disposition: A | Discharge status: Home / Self Care | Payer: Medicare Other | Source: Ambulatory Visit | Attending: Obstetrics & Gynecology | Admitting: Obstetrics & Gynecology

## 2017-11-16 DIAGNOSIS — Z1231 Encounter for screening mammogram for malignant neoplasm of breast: Secondary | ICD-10-CM

## 2018-01-05 DIAGNOSIS — H35033 Hypertensive retinopathy, bilateral: Secondary | ICD-10-CM | POA: Diagnosis not present

## 2018-01-05 DIAGNOSIS — H40013 Open angle with borderline findings, low risk, bilateral: Secondary | ICD-10-CM | POA: Diagnosis not present

## 2018-01-05 DIAGNOSIS — H35373 Puckering of macula, bilateral: Secondary | ICD-10-CM | POA: Diagnosis not present

## 2018-01-05 DIAGNOSIS — H35362 Drusen (degenerative) of macula, left eye: Secondary | ICD-10-CM | POA: Diagnosis not present

## 2018-02-08 DIAGNOSIS — Z23 Encounter for immunization: Secondary | ICD-10-CM | POA: Diagnosis not present

## 2018-03-22 NOTE — Progress Notes (Signed)
72 y.o. G2P2 Married White or Caucasian female here for annual exam.  Having some issues with right thumb joint.  Has seen Dr. Fredna Dow.   H/o early osteoporosis.  Has seen a trainer and doing exercises at home.    Denies vaginal bleeding.    Since last UTI, she feels she is having some urinary "dripping".  This is bothersome.  She is having vaginal dryness as well.  Hot flashes are reasonable.  PCP:  Dr. Harrington Challenger.    No LMP recorded. Patient has had a hysterectomy.          Sexually active: No.  The current method of family planning is status post hysterectomy.    Exercising: Yes.    Walking, stationary bike , yoga, weights. Smoker:  no  Health Maintenance: Pap:  11/19/09 WNL History of abnormal Pap:  no MMG:  11/16/17 Bi-rads 1 neg Density B Colonoscopy:  10/17 with Dr. Watt Climes  BMD:   11/07/15 osteoporosis TDaP:  Current per patient  Pneumonia vaccine(s):  PCP Shingrix:   Zostavax has been completed.   Not sure about having that Shingrix yet.     Hep C testing: 10/07/15 Negative  Screening Labs: PCP , Hb today: PCP Urine today:   reports that she has never smoked. She has never used smokeless tobacco. She reports that she drinks about 1.0 standard drinks of alcohol per week. She reports that she does not use drugs.  Past Medical History:  Diagnosis Date  . Contact lens/glasses fitting    wears one contact lt eye  . Heterozygous factor V Leiden mutation ([place])   . Osteopenia   . PONV (postoperative nausea and vomiting)   . Superficial vein thrombosis 1997    Past Surgical History:  Procedure Laterality Date  . BARTHOLIN CYST MARSUPIALIZATION    . BARTHOLIN CYST MARSUPIALIZATION Right 08/19/2014   Procedure: BARTHOLIN CYST MARSUPIALIZATION;  Surgeon: Megan Salon, MD;  Location: [facility];  Service: Gynecology;  Laterality: Right;  . CARPAL TUNNEL RELEASE Right 12/20/2012   Procedure: CARPAL TUNNEL RELEASE;  Surgeon: Wynonia Sours, MD;  Location: [facility];  Service:  Orthopedics;  Laterality: Right;  . CATARACT EXTRACTION Right 2009      . [place]   x2  . EAR CYST EXCISION Left 08/15/2014   Procedure: LEFT THUMB DEBRIDEMENT INTERPHALANGEAL JOINT LEFT THUMB REMOVAL MUCOID CYST ;  Surgeon: Daryll Brod, MD;  Location: [facility];  Service: Orthopedics;  Laterality: Left;  . FINGER SURGERY  2002   lt long finger mass  . GANGLION CYST EXCISION Left 2/02  . TOTAL ABDOMINAL HYSTERECTOMY W/ BILATERAL SALPINGOOPHORECTOMY  1997  . TUBAL LIGATION  1977    Current Outpatient Medications  Medication Sig Dispense Refill  . acetaminophen (TYLENOL) 500 MG tablet Take 500 mg by mouth every 4 (four) hours as needed for mild pain.    Marland Kitchen aspirin 81 MG tablet Take 81 mg by mouth daily.    . calcium carbonate (OS-CAL) 600 MG TABS tablet Take 600 mg by mouth 2 (two) times daily with a meal.    . calcium carbonate (TUMS - DOSED IN MG ELEMENTAL CALCIUM) 500 MG chewable tablet Chew 1 tablet by mouth as needed for indigestion or heartburn.    . fish oil-omega-3 fatty acids 1000 MG capsule Take 2 g by mouth daily.    . Flaxseed, Linseed, (FLAX SEED OIL PO) Take 1 capsule by mouth daily.     Marland Kitchen ibuprofen (  ADVIL,MOTRIN) 200 MG tablet Take 200 mg by mouth every 6 (six) hours as needed for moderate pain.    . Multiple Vitamins-Minerals (MULTIVITAMIN WITH MINERALS) tablet Take 1 tablet by mouth daily.    . naproxen sodium (ANAPROX) 220 MG tablet Take 220 mg by mouth daily.     . Plant Sterols and Stanols (CHOLESTOFF PO) Take by mouth daily.     . vitamin C (ASCORBIC ACID) 500 MG tablet Take 500 mg by mouth daily.     No current facility-administered medications for this visit.     Family History  Problem Relation Age of Onset  . Heart disease Mother   . Hyperlipidemia Mother   . Thyroid disease Mother   . Osteoporosis Mother   . Dementia Mother   . Breast cancer Mother 35  . Diabetes Father   . Heart disease Father   . Cancer Father         prostate  . Cancer Brother        Eye    Review of Systems  Genitourinary:       Loss of urin with cough or sneeze  Night urination  Loss of urine spontaneously  Pain or bleeding with intercourse Loss of sexual interest      Exam:   BP 130/70   Pulse 64   Resp 14   Ht 5\' 5"  (1.651 m)   Wt 150 lb (68 kg)   BMI 24.96 kg/m    Height: 5\' 5"  (165.1 cm)  Ht Readings from Last 3 Encounters:  03/30/18 5\' 5"  (1.651 m)  02/07/17 5\' 6"  (1.676 m)  01/20/17 5\' 6"  (1.676 m)    General appearance: alert, cooperative and appears stated age Head: Normocephalic, without obvious abnormality, atraumatic Neck: no adenopathy, supple, symmetrical, trachea midline and thyroid normal to inspection and palpation Lungs: clear to auscultation bilaterally Breasts: normal appearance, no masses or tenderness Heart: regular rate and rhythm Abdomen: soft, non-tender; bowel sounds normal; no masses,  no organomegaly Extremities: extremities normal, atraumatic, no cyanosis or edema Skin: Skin color, texture, turgor normal. No rashes or lesions Lymph nodes: Cervical, supraclavicular, and axillary nodes normal. No abnormal inguinal nodes palpated Neurologic: Grossly normal   Pelvic: External genitalia:  no lesions              Urethra:  normal appearing urethra with no masses, tenderness or lesions              Bartholins and Skenes: normal                 Vagina: normal appearing vagina with normal color and discharge, no lesions              Cervix: absent              Pap taken: No. Bimanual Exam:  Uterus:  normal size, contour, position, consistency, mobility, non-tender              Adnexa: normal adnexa and no mass, fullness, tenderness               Rectovaginal: Confirms               Anus:  normal sphincter tone, no lesions  Chaperone was present for exam.  A:  Well Woman with normal exam PMP, off HRT H/O factor V Leiden heterozygous H/o TAH/BSO (performed due to bleeding) H/O  depression H/o recurrent UTI H/o bartholin's gland marsupializations on each side Osteoporosis, planning repeat this next  year Varicose veins  P:   Mammogram guidelines reviewed pap smear not indicated Plan BMD in the summer with next MMG.  Order placed. Referral to Dr. Donnetta Hutching for consideration of treatment of varicose vein Urine culture pending.  If this is negative, consider vaginal estrogen for treatment of urinary symptoms.  return annually or prn

## 2018-03-30 ENCOUNTER — Ambulatory Visit (INDEPENDENT_AMBULATORY_CARE_PROVIDER_SITE_OTHER): Payer: Medicare Other | Admitting: Obstetrics & Gynecology

## 2018-03-30 ENCOUNTER — Encounter: Payer: Self-pay | Admitting: Obstetrics & Gynecology

## 2018-03-30 ENCOUNTER — Encounter

## 2018-03-30 VITALS — BP 130/70 | HR 64 | Resp 14 | Ht 65.0 in | Wt 150.0 lb

## 2018-03-30 DIAGNOSIS — M810 Age-related osteoporosis without current pathological fracture: Secondary | ICD-10-CM

## 2018-03-30 DIAGNOSIS — N393 Stress incontinence (female) (male): Secondary | ICD-10-CM | POA: Diagnosis not present

## 2018-03-30 DIAGNOSIS — I83813 Varicose veins of bilateral lower extremities with pain: Secondary | ICD-10-CM | POA: Diagnosis not present

## 2018-03-30 DIAGNOSIS — N751 Abscess of Bartholin's gland: Secondary | ICD-10-CM | POA: Diagnosis not present

## 2018-03-30 DIAGNOSIS — Z01419 Encounter for gynecological examination (general) (routine) without abnormal findings: Secondary | ICD-10-CM | POA: Diagnosis not present

## 2018-04-02 LAB — URINE CULTURE

## 2018-04-04 ENCOUNTER — Other Ambulatory Visit: Payer: Self-pay | Admitting: *Deleted

## 2018-04-04 ENCOUNTER — Telehealth: Payer: Self-pay | Admitting: *Deleted

## 2018-04-04 DIAGNOSIS — N3090 Cystitis, unspecified without hematuria: Secondary | ICD-10-CM

## 2018-04-04 MED ORDER — SULFAMETHOXAZOLE-TRIMETHOPRIM 800-160 MG PO TABS: 1.0000 | ORAL_TABLET | Freq: Two times a day (BID) | ORAL | 0 refills | Status: AC

## 2018-04-04 NOTE — Telephone Encounter (Signed)
Notes recorded by Polly Cobia, CMA on 04/04/2018 at 10:53 AM EST Pt notified. Verbalized understanding. Nurse appt scheduled 04/17/18 Rx sent to pharmacy  UC future order placed. ------  Notes recorded by Megan Salon, MD on 04/04/2018 at 10:07 AM EST Please let pt know her urine culture showed two common UTI causing bacteria-- e coli and proteus. Ok to treat with bactrim DS BID x 3 days. Should have repeat culture done in 1-2 weeks after finishing the antibiotics. I think this is causing her "dribbling" symptoms.

## 2018-04-04 NOTE — Telephone Encounter (Signed)
Patient calling for urine results.

## 2018-04-17 ENCOUNTER — Ambulatory Visit (INDEPENDENT_AMBULATORY_CARE_PROVIDER_SITE_OTHER): Payer: Medicare Other

## 2018-04-17 DIAGNOSIS — N3090 Cystitis, unspecified without hematuria: Secondary | ICD-10-CM | POA: Diagnosis not present

## 2018-04-17 DIAGNOSIS — N3080 Other cystitis without hematuria: Secondary | ICD-10-CM

## 2018-04-17 NOTE — Progress Notes (Signed)
Patient in office for TOC urine culture. Per patient is feeling well with no complaints. Patient completed the course of antibiotics. Clean catch urine was given and sent to the lab to be resulted. Routing to provider and will close encounter.

## 2018-04-18 LAB — URINE CULTURE

## 2018-04-20 ENCOUNTER — Telehealth: Payer: Self-pay | Admitting: *Deleted

## 2018-04-20 NOTE — Telephone Encounter (Signed)
Notes recorded by Yates Decamp, CMA on 04/20/2018 at 10:07 AM EST Patient notified of results.

## 2018-04-20 NOTE — Telephone Encounter (Signed)
-----   Message from Megan Salon, MD sent at 04/18/2018 10:33 PM EST ----- Please let pt know her follow up urine culture was negative.  Thanks.

## 2018-04-20 NOTE — Telephone Encounter (Signed)
Called patient. Unable to leave message.

## 2018-05-19 ENCOUNTER — Other Ambulatory Visit: Payer: Self-pay | Admitting: Vascular Surgery

## 2018-05-19 DIAGNOSIS — L97909 Non-pressure chronic ulcer of unspecified part of unspecified lower leg with unspecified severity: Secondary | ICD-10-CM

## 2018-05-19 DIAGNOSIS — I8393 Asymptomatic varicose veins of bilateral lower extremities: Secondary | ICD-10-CM

## 2018-05-19 DIAGNOSIS — I83009 Varicose veins of unspecified lower extremity with ulcer of unspecified site: Secondary | ICD-10-CM

## 2018-05-31 DIAGNOSIS — M180 Bilateral primary osteoarthritis of first carpometacarpal joints: Secondary | ICD-10-CM | POA: Insufficient documentation

## 2018-06-12 ENCOUNTER — Other Ambulatory Visit: Payer: Self-pay

## 2018-06-12 ENCOUNTER — Ambulatory Visit (INDEPENDENT_AMBULATORY_CARE_PROVIDER_SITE_OTHER): Payer: Medicare Other | Admitting: Obstetrics & Gynecology

## 2018-06-12 VITALS — BP 130/82 | HR 72 | Resp 16 | Ht 65.0 in | Wt 152.0 lb

## 2018-06-12 DIAGNOSIS — R300 Dysuria: Secondary | ICD-10-CM

## 2018-06-12 LAB — POCT URINALYSIS DIPSTICK
Bilirubin, UA: NEGATIVE
GLUCOSE UA: NEGATIVE
Ketones, UA: NEGATIVE
Nitrite, UA: NEGATIVE
Protein, UA: NEGATIVE
RBC UA: NEGATIVE
Urobilinogen, UA: 0.2 E.U./dL
pH, UA: 6 (ref 5.0–8.0)

## 2018-06-12 MED ORDER — SULFAMETHOXAZOLE-TRIMETHOPRIM 800-160 MG PO TABS
1.0000 | ORAL_TABLET | Freq: Two times a day (BID) | ORAL | 0 refills | Status: DC
Start: 2018-06-12 — End: 2018-06-21

## 2018-06-12 NOTE — Progress Notes (Signed)
GYNECOLOGY  VISIT  CC:   Pain with urination  HPI: 73 y.o. G2P2 Married White or Caucasian female here for UTI symptoms x 2 days.  Reports she's had urinary urgency and some pelvic pressure.  She is having some dysuria and some urinary incontinence.  Has seen urology in the past.  This is her third episode of similar symptoms since June.    Denies vaginal bleeding, vaginal discharge, pelvic pain, low back pain or fever.  GYNECOLOGIC HISTORY: No LMP recorded. Patient has had a hysterectomy. Contraception: hysterectomy  Menopausal hormone therapy: none  Patient Active Problem List   Diagnosis Date Noted  . Osteoporosis 12/04/2015  . Bartholin's gland abscess 03/08/2014  . ASTHMA 10/05/2007  . OSTEOARTHRITIS 10/05/2007  . FOREIGN BODY, ASPIRATION 10/05/2007    Past Medical History:  Diagnosis Date  . Contact lens/glasses fitting    wears one contact lt eye  . Heterozygous factor V Leiden mutation ([place])   . Osteopenia   . PONV (postoperative nausea and vomiting)   . Superficial vein thrombosis 1997    Past Surgical History:  Procedure Laterality Date  . BARTHOLIN CYST MARSUPIALIZATION    . BARTHOLIN CYST MARSUPIALIZATION Right 08/19/2014   Procedure: BARTHOLIN CYST MARSUPIALIZATION;  Surgeon: Megan Salon, MD;  Location: [facility];  Service: Gynecology;  Laterality: Right;  . CARPAL TUNNEL RELEASE Right 12/20/2012   Procedure: CARPAL TUNNEL RELEASE;  Surgeon: Wynonia Sours, MD;  Location: [facility];  Service: Orthopedics;  Laterality: Right;  . CATARACT EXTRACTION Right 2009      . [place]   x2  . EAR CYST EXCISION Left 08/15/2014   Procedure: LEFT THUMB DEBRIDEMENT INTERPHALANGEAL JOINT LEFT THUMB REMOVAL MUCOID CYST ;  Surgeon: Daryll Brod, MD;  Location: [facility];  Service: Orthopedics;  Laterality: Left;  . FINGER SURGERY  2002   lt long finger mass  . GANGLION CYST EXCISION Left 2/02  . TOTAL ABDOMINAL HYSTERECTOMY W/  BILATERAL SALPINGOOPHORECTOMY  1997  . TUBAL LIGATION  1977    MEDS:   Current Outpatient Medications on File Prior to Visit  Medication Sig Dispense Refill  . acetaminophen (TYLENOL) 500 MG tablet Take 500 mg by mouth every 4 (four) hours as needed for mild pain.    . calcium carbonate (OS-CAL) 600 MG TABS tablet Take 600 mg by mouth 2 (two) times daily with a meal.    . calcium carbonate (TUMS - DOSED IN MG ELEMENTAL CALCIUM) 500 MG chewable tablet Chew 1 tablet by mouth as needed for indigestion or heartburn.    . fish oil-omega-3 fatty acids 1000 MG capsule Take 2 g by mouth daily.    . Flaxseed, Linseed, (FLAX SEED OIL PO) Take 1 capsule by mouth daily.     Marland Kitchen ibuprofen (ADVIL,MOTRIN) 200 MG tablet Take 200 mg by mouth every 6 (six) hours as needed for moderate pain.    . Multiple Vitamins-Minerals (MULTIVITAMIN WITH MINERALS) tablet Take 1 tablet by mouth daily.    . naproxen sodium (ANAPROX) 220 MG tablet Take 220 mg by mouth daily.     . Plant Sterols and Stanols (CHOLESTOFF PO) Take by mouth daily.     . TURMERIC PO Take by mouth daily.    . vitamin C (ASCORBIC ACID) 500 MG tablet Take 500 mg by mouth daily.     No current facility-administered medications on file prior to visit.     ALLERGIES: Ofloxacin  Family History  Problem Relation  Age of Onset  . Heart disease Mother   . Hyperlipidemia Mother   . Thyroid disease Mother   . Osteoporosis Mother   . Dementia Mother   . Breast cancer Mother 1  . Diabetes Father   . Heart disease Father   . Cancer Father        prostate  . Cancer Brother        Eye    SH:  Married, non smoker  Review of Systems  Genitourinary: Positive for dysuria and urgency.       Loss of urine spontaneously  Night urination   All other systems reviewed and are negative.   PHYSICAL EXAMINATION:    BP 130/82 (BP Location: Left Arm, Patient Position: Sitting, Cuff Size: Large)   Pulse 72   Resp 16   Ht 5\' 5"  (1.651 m)   Wt 152 lb  (68.9 kg)   BMI 25.29 kg/m     General appearance: alert, cooperative and appears stated age Abdomen: soft, non-tender; bowel sounds normal; no masses,  no organomegaly Lymph:  no inguinal LAD noted  Pelvic: External genitalia:  no lesions              Urethra:  normal appearing urethra with no masses, tenderness or lesions              Bartholins and Skenes: normal                 Vagina: normal appearing vagina with normal color and discharge, no lesions              Cervix: absent              Bimanual Exam:  Uterus:  uterus absent  Chaperone was present for exam.  Assessment: Urinary frequency and dysuria  Plan: Bactrim DS BID X 3 days sent to pharmacy Urine culture pending.  If positive, will start treatment with vaginal estrogen cream as well to see if can decrease frequency of these episodes.

## 2018-06-13 ENCOUNTER — Encounter: Payer: Self-pay | Admitting: Vascular Surgery

## 2018-06-13 ENCOUNTER — Other Ambulatory Visit: Payer: Self-pay

## 2018-06-13 ENCOUNTER — Ambulatory Visit (INDEPENDENT_AMBULATORY_CARE_PROVIDER_SITE_OTHER): Payer: Medicare Other | Admitting: Vascular Surgery

## 2018-06-13 ENCOUNTER — Ambulatory Visit (HOSPITAL_COMMUNITY)
Admission: RE | Admit: 2018-06-13 | Discharge: 2018-06-13 | Disposition: A | Discharge status: Home / Self Care | Payer: Medicare Other | Source: Ambulatory Visit | Attending: Family | Admitting: Family

## 2018-06-13 VITALS — BP 132/79 | HR 61 | Temp 97.1°F | Resp 16 | Ht 65.5 in | Wt 148.0 lb

## 2018-06-13 DIAGNOSIS — I8393 Asymptomatic varicose veins of bilateral lower extremities: Secondary | ICD-10-CM | POA: Diagnosis not present

## 2018-06-13 NOTE — Progress Notes (Signed)
[facility] [place]  Patient name: [name] MRN: [id number] DOB: Jan 23, 1946 Sex: female  REASON FOR CONSULT: Evaluation of lower extremity venous pathology right greater than left  HPI: [name] is a 73 y.o. female, who is here today for evaluation of lower extremity venous pathology.  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  She has had no history of DVT.  Did have a history of superficial thrombophlebitis in the past.  She reports pain with prolonged standing mostly associated with the varicosities in her medial right thigh and right calf.  She does have some swelling in both lower extremities as well.  She is heterozygous factor V Leiden positive.  Past Medical History:  Diagnosis Date  . Contact lens/glasses fitting    wears one contact lt eye  . Heterozygous factor V Leiden mutation ([place])   . Osteopenia   . PONV (postoperative nausea and vomiting)   . Superficial vein thrombosis 1997  . Varicose veins of both lower extremities     Family History  Problem Relation Age of Onset  . Heart disease Mother   . Hyperlipidemia Mother   . Thyroid disease Mother   . Osteoporosis Mother   . Dementia Mother   . Breast cancer Mother 80  . Diabetes Father   . Heart disease Father   . Cancer Father        prostate  . Cancer Brother        Eye    SOCIAL HISTORY: Social History   Socioeconomic History  . Marital status: Married    Spouse name: Not on file  . Number of children: Not on file  . Years of education: Not on file  . Highest education level: Not on file  Occupational History  . Not on file  Social Needs  . Financial resource strain: Not on file  . Food insecurity:    Worry: Not on file    Inability: Not on file  . Transportation needs:    Medical: Not on file    Non-medical: Not on file  Tobacco Use  . Smoking status:  Never Smoker  . Smokeless tobacco: Never Used  Substance and Sexual Activity  . Alcohol use: Yes    Alcohol/week: 1.0 standard drinks    Types: 1 Standard drinks or equivalent per week    Comment: glass of wine  . Drug use: No  . Sexual activity: Yes    Partners: Male    Birth control/protection: None  Lifestyle  . Physical activity:    Days per week: Not on file    Minutes per session: Not on file  . Stress: Not on file  Relationships  . Social connections:    Talks on phone: Not on file    Gets together: Not on file    Attends religious service: Not on file    Active member of club or organization: Not on file    Attends meetings of clubs or organizations: Not on file    Relationship status: Not on file  . Intimate partner violence:    Fear of current or ex partner: Not on file    Emotionally abused: Not on file    Physically abused: Not on file    Forced sexual activity: Not on file  Other Topics Concern  . Not on file  Social History Narrative  . Not on file    Allergies  Allergen Reactions  . Ofloxacin Itching and Swelling    Current Outpatient Medications  Medication Sig Dispense Refill  . acetaminophen (TYLENOL) 500 MG tablet Take 500 mg by mouth every 4 (four) hours as needed for mild pain.    . calcium carbonate (OS-CAL) 600 MG TABS tablet Take 600 mg by mouth 2 (two) times daily with a meal.    . calcium carbonate (TUMS - DOSED IN MG ELEMENTAL CALCIUM) 500 MG chewable tablet Chew 1 tablet by mouth as needed for indigestion or heartburn.    . fish oil-omega-3 fatty acids 1000 MG capsule Take 2 g by mouth daily.    . Flaxseed, Linseed, (FLAX SEED OIL PO) Take 1 capsule by mouth daily.     Marland Kitchen ibuprofen (ADVIL,MOTRIN) 200 MG tablet Take 200 mg by mouth every 6 (six) hours as needed for moderate pain.    . Multiple Vitamins-Minerals (MULTIVITAMIN WITH MINERALS) tablet Take 1 tablet by mouth daily.    . naproxen sodium (ANAPROX) 220 MG tablet Take 220 mg by mouth  daily.     . Plant Sterols and Stanols (CHOLESTOFF PO) Take by mouth daily.     Marland Kitchen sulfamethoxazole-trimethoprim (BACTRIM DS,SEPTRA DS) 800-160 MG tablet Take 1 tablet by mouth 2 (two) times daily. 6 tablet 0  . TURMERIC PO Take by mouth daily.    . vitamin C (ASCORBIC ACID) 500 MG tablet Take 500 mg by mouth daily.     No current facility-administered medications for this visit.     REVIEW OF SYSTEMS:  [X]  denotes positive finding, [ ]  denotes negative finding Cardiac  Comments:  Chest pain or chest pressure:    Shortness of breath upon exertion:    Short of breath when lying flat:    Irregular heart rhythm:        Vascular    Pain in calf, thigh, or hip brought on by ambulation: x   Pain in feet at night that wakes you up from your sleep:     Blood clot in your veins:    Leg swelling:         Pulmonary    Oxygen at home:    Productive cough:     Wheezing:         Neurologic    Sudden weakness in arms or legs:     Sudden numbness in arms or legs:     Sudden onset of difficulty speaking or slurred speech:    Temporary loss of vision in one eye:     Problems with dizziness:         Gastrointestinal    Blood in stool:     Vomited blood:         Genitourinary    Burning when urinating:  x   Blood in urine:        Psychiatric    Major depression:         Hematologic    Bleeding problems:    Problems with blood clotting too easily:        Skin    Rashes or ulcers:        Constitutional    Fever or chills:      PHYSICAL EXAM: Vitals:   06/13/18 1146  BP: 132/79  Pulse: 61  Resp: 16  Temp: (!) 97.1 F (36.2 C)  TempSrc: Oral  SpO2: 96%  Weight: 148 lb (67.1 kg)  Height: 5' 5.5" (1.664 m)    GENERAL: The patient is a well-nourished female,  in no acute distress. The vital signs are documented above. CARDIOVASCULAR: Palpable radial and palpable dorsalis pedis pulses bilaterally.  Large varicosities throughout her medial right thigh and medial right calf.   Easily palpable enlarged small saphenous vein on the left PULMONARY: There is good air exchange  ABDOMEN: Soft and non-tender  MUSCULOSKELETAL: There are no major deformities or cyanosis. NEUROLOGIC: No focal weakness or paresthesias are detected. SKIN: There are no ulcers or rashes noted. PSYCHIATRIC: The patient has a normal affect.  DATA:  She has reflux in her great saphenous vein bilaterally and small saphenous vein bilaterally.  There is marked dilatation of her right great saphenous and left small saphenous vein  MEDICAL ISSUES: I had long discussion with patient regarding the significance of these findings.  She is not having any discomfort in her left leg and therefore I would recommend observation only.  She does have increasing discomfort in her right leg associated with the large varicosities.  She has not had a trial of compression.  We have fitted her today with 20 to 30 mmHg thigh-high compression garments instructed on the daily use of these.  Also stressed the importance of elevation when possible and Advil for the discomfort.  We will see her again in 3 months to determine if she is having successful treatment with these conservative measures.  She would be an excellent candidate of laser ablation of her right great saphenous vein and stab phlebectomy of her tributary varicosities should she fail conservative treatment.  She will see either Dr. Scot Dock or fields in 3 months   Rosetta Posner, MD [facility] Tel [phone] Pager [phone]

## 2018-06-14 ENCOUNTER — Encounter: Payer: Self-pay | Admitting: Obstetrics & Gynecology

## 2018-06-14 LAB — URINE CULTURE

## 2018-06-15 ENCOUNTER — Other Ambulatory Visit: Payer: Self-pay | Admitting: *Deleted

## 2018-06-15 DIAGNOSIS — R300 Dysuria: Secondary | ICD-10-CM

## 2018-06-15 MED ORDER — ESTRADIOL 0.1 MG/GM VA CREA: 1.0000 | TOPICAL_CREAM | VAGINAL | 1 refills | Status: DC

## 2018-06-21 ENCOUNTER — Ambulatory Visit (INDEPENDENT_AMBULATORY_CARE_PROVIDER_SITE_OTHER): Payer: Medicare Other

## 2018-06-21 ENCOUNTER — Other Ambulatory Visit: Payer: Self-pay

## 2018-06-21 DIAGNOSIS — R300 Dysuria: Secondary | ICD-10-CM | POA: Diagnosis not present

## 2018-06-21 NOTE — Progress Notes (Signed)
Patient is here for a test of cure. She states that she is not having any symptoms and is feeling much better after completing the course of Bactrim.   Routing to Provider for final review.

## 2018-06-22 LAB — URINE CULTURE

## 2018-06-23 ENCOUNTER — Telehealth: Payer: Self-pay | Admitting: Obstetrics & Gynecology

## 2018-06-23 MED ORDER — NONFORMULARY OR COMPOUNDED ITEM: 3 refills | Status: AC

## 2018-06-23 NOTE — Telephone Encounter (Signed)
Rx signed.  Can be faxed to [facility].  Should be much cheaper and will be a three month supply.

## 2018-06-23 NOTE — Telephone Encounter (Signed)
Call to patient. Patient states that she went to pick up the Estrace cream today from [facility] and it was going to cost her $224. Patient states Dr. Sabra Heck had mentioned this may be the case. States Dr. Sabra Heck said could send to another pharmacy, but patient not sure which one. RN advised would review with Dr. Sabra Heck and return call. Patient agreeable.

## 2018-06-23 NOTE — Telephone Encounter (Signed)
Patient calling about her prescription.

## 2018-06-26 NOTE — Telephone Encounter (Signed)
Rx faxed to [facility], patient notified of Rx.  Patient verbalizes understanding.   Encounter closed.

## 2018-07-03 DIAGNOSIS — Z131 Encounter for screening for diabetes mellitus: Secondary | ICD-10-CM | POA: Diagnosis not present

## 2018-07-03 DIAGNOSIS — E7800 Pure hypercholesterolemia, unspecified: Secondary | ICD-10-CM | POA: Diagnosis not present

## 2018-07-03 DIAGNOSIS — Z0000 Encounter for general adult medical examination without abnormal findings: Secondary | ICD-10-CM | POA: Diagnosis not present

## 2018-07-03 DIAGNOSIS — M810 Age-related osteoporosis without current pathological fracture: Secondary | ICD-10-CM | POA: Diagnosis not present

## 2018-07-07 DIAGNOSIS — Z0000 Encounter for general adult medical examination without abnormal findings: Secondary | ICD-10-CM | POA: Diagnosis not present

## 2018-09-20 ENCOUNTER — Ambulatory Visit: Payer: Self-pay | Admitting: Vascular Surgery

## 2018-10-05 ENCOUNTER — Other Ambulatory Visit: Payer: Self-pay | Admitting: Obstetrics & Gynecology

## 2018-10-05 DIAGNOSIS — Z1231 Encounter for screening mammogram for malignant neoplasm of breast: Secondary | ICD-10-CM

## 2018-10-06 DIAGNOSIS — H40013 Open angle with borderline findings, low risk, bilateral: Secondary | ICD-10-CM | POA: Diagnosis not present

## 2018-10-18 DIAGNOSIS — R52 Pain, unspecified: Secondary | ICD-10-CM | POA: Diagnosis not present

## 2018-10-18 DIAGNOSIS — M180 Bilateral primary osteoarthritis of first carpometacarpal joints: Secondary | ICD-10-CM | POA: Diagnosis not present

## 2018-12-22 ENCOUNTER — Other Ambulatory Visit: Payer: Self-pay

## 2018-12-22 ENCOUNTER — Ambulatory Visit
Admission: RE | Admit: 2018-12-22 | Discharge: 2018-12-22 | Disposition: A | Discharge status: Home / Self Care | Payer: Medicare Other | Source: Ambulatory Visit | Attending: Obstetrics & Gynecology | Admitting: Obstetrics & Gynecology

## 2018-12-22 DIAGNOSIS — M810 Age-related osteoporosis without current pathological fracture: Secondary | ICD-10-CM

## 2018-12-22 DIAGNOSIS — Z780 Asymptomatic menopausal state: Secondary | ICD-10-CM | POA: Diagnosis not present

## 2018-12-22 DIAGNOSIS — Z1231 Encounter for screening mammogram for malignant neoplasm of breast: Secondary | ICD-10-CM | POA: Diagnosis not present

## 2018-12-22 DIAGNOSIS — M8589 Other specified disorders of bone density and structure, multiple sites: Secondary | ICD-10-CM | POA: Diagnosis not present

## 2018-12-26 ENCOUNTER — Encounter: Payer: Self-pay | Admitting: Obstetrics & Gynecology

## 2018-12-26 ENCOUNTER — Other Ambulatory Visit: Payer: Self-pay

## 2018-12-26 ENCOUNTER — Ambulatory Visit (INDEPENDENT_AMBULATORY_CARE_PROVIDER_SITE_OTHER): Payer: Medicare Other | Admitting: Obstetrics & Gynecology

## 2018-12-26 ENCOUNTER — Telehealth: Payer: Self-pay | Admitting: Obstetrics & Gynecology

## 2018-12-26 VITALS — BP 128/82 | HR 80 | Temp 97.2°F | Ht 65.0 in | Wt 153.0 lb

## 2018-12-26 DIAGNOSIS — N390 Urinary tract infection, site not specified: Secondary | ICD-10-CM

## 2018-12-26 DIAGNOSIS — R300 Dysuria: Secondary | ICD-10-CM | POA: Diagnosis not present

## 2018-12-26 LAB — POCT URINALYSIS DIPSTICK
Bilirubin, UA: NEGATIVE
Blood, UA: NEGATIVE
Glucose, UA: NEGATIVE
Ketones, UA: NEGATIVE
Nitrite, UA: NEGATIVE
Protein, UA: NEGATIVE
Urobilinogen, UA: 0.2 E.U./dL
pH, UA: 6 (ref 5.0–8.0)

## 2018-12-26 MED ORDER — SULFAMETHOXAZOLE-TRIMETHOPRIM 800-160 MG PO TABS: 1.0000 | ORAL_TABLET | Freq: Two times a day (BID) | ORAL | 0 refills | Status: DC

## 2018-12-26 NOTE — Progress Notes (Signed)
GYNECOLOGY  VISIT  CC:   Dysuria, urgency x 1 week  HPI: 73 y.o. G2P2 Married White or Caucasian female here for dysuria, urgency x 1 week.  She has experienced some stinging with some "pins and needles" sensation.  Denies back pain.  Denies fever.    She's had four UTIs since last August--August, October, November and February.    She has been using vaginal estrogen cream to help prevent UTIs.  Reports the last time she used it was about two weeks ago.    GYNECOLOGIC HISTORY: No LMP recorded. Patient has had a hysterectomy. Contraception: PMP Menopausal hormone therapy: estrace vag cream   Patient Active Problem List   Diagnosis Date Noted  . Primary osteoarthritis of both first carpometacarpal joints 05/31/2018  . Osteoporosis 12/04/2015  . Bartholin's gland abscess 03/08/2014  . ASTHMA 10/05/2007  . OSTEOARTHRITIS 10/05/2007    Past Medical History:  Diagnosis Date  . Contact lens/glasses fitting    wears one contact lt eye  . Heterozygous factor V Leiden mutation ([place])   . Osteopenia   . PONV (postoperative nausea and vomiting)   . Superficial vein thrombosis 1997  . Varicose veins of both lower extremities     Past Surgical History:  Procedure Laterality Date  . BARTHOLIN CYST MARSUPIALIZATION    . BARTHOLIN CYST MARSUPIALIZATION Right 08/19/2014   Procedure: BARTHOLIN CYST MARSUPIALIZATION;  Surgeon: Megan Salon, MD;  Location: [facility];  Service: Gynecology;  Laterality: Right;  . CARPAL TUNNEL RELEASE Right 12/20/2012   Procedure: CARPAL TUNNEL RELEASE;  Surgeon: Wynonia Sours, MD;  Location: [facility];  Service: Orthopedics;  Laterality: Right;  . CATARACT EXTRACTION Right 2009      . [place]   x2  . EAR CYST EXCISION Left 08/15/2014   Procedure: LEFT THUMB DEBRIDEMENT INTERPHALANGEAL JOINT LEFT THUMB REMOVAL MUCOID CYST ;  Surgeon: Daryll Brod, MD;  Location: [facility];  Service: Orthopedics;  Laterality: Left;   . FINGER SURGERY  2002   lt long finger mass  . GANGLION CYST EXCISION Left 2/02  . TOTAL ABDOMINAL HYSTERECTOMY W/ BILATERAL SALPINGOOPHORECTOMY  1997  . TUBAL LIGATION  1977    MEDS:   Current Outpatient Medications on File Prior to Visit  Medication Sig Dispense Refill  . acetaminophen (TYLENOL) 500 MG tablet Take 500 mg by mouth every 4 (four) hours as needed for mild pain.    . calcium carbonate (OS-CAL) 600 MG TABS tablet Take 600 mg by mouth 2 (two) times daily with a meal.    . calcium carbonate (TUMS - DOSED IN MG ELEMENTAL CALCIUM) 500 MG chewable tablet Chew 1 tablet by mouth as needed for indigestion or heartburn.    . fish oil-omega-3 fatty acids 1000 MG capsule Take 2 g by mouth daily.    . Flaxseed, Linseed, (FLAX SEED OIL PO) Take 1 capsule by mouth daily.     Marland Kitchen ibuprofen (ADVIL,MOTRIN) 200 MG tablet Take 200 mg by mouth every 6 (six) hours as needed for moderate pain.    . Multiple Vitamins-Minerals (MULTIVITAMIN WITH MINERALS) tablet Take 1 tablet by mouth daily.    . naproxen sodium (ANAPROX) 220 MG tablet Take 220 mg by mouth daily.     . NONFORMULARY OR COMPOUNDED ITEM Estradiol cream 0.02% in 90ml prefilled syringes.  27ml pv twice weekly.  #32month supply. 1 each 3  . Plant Sterols and Stanols (CHOLESTOFF PO) Take by mouth daily.     Marland Kitchen  TURMERIC PO Take by mouth daily.    . vitamin C (ASCORBIC ACID) 500 MG tablet Take 500 mg by mouth daily.     No current facility-administered medications on file prior to visit.    ALLERGIES: Ofloxacin  Family History  Problem Relation Age of Onset  . Heart disease Mother   . Hyperlipidemia Mother   . Thyroid disease Mother   . Osteoporosis Mother   . Dementia Mother   . Breast cancer Mother 62  . Diabetes Father   . Heart disease Father   . Cancer Father        prostate  . Cancer Brother        Eye    SH:  Married, non smoker  Review of Systems  Genitourinary: Positive for dysuria and urgency.  All other systems  reviewed and are negative.   PHYSICAL EXAMINATION:    BP 128/82   Pulse 80   Temp (!) 97.2 F (36.2 C) (Temporal)   Ht 5\' 5"  (1.651 m)   Wt 153 lb (69.4 kg)   BMI 25.46 kg/m     General appearance: alert, cooperative and appears stated age Abdomen: soft, suprapubic tenderness; bowel sounds normal; no masses,  no organomegaly Lymph:  no inguinal LAD noted  Pelvic: External genitalia:  no lesions              Urethra:  normal appearing urethra with no masses, tenderness or lesions              Bartholins and Skenes: normal                 Vagina: normal appearing vagina with normal color and discharge, no lesions              Cervix: no lesions              Bimanual Exam:  Uterus:  normal size, contour, position, consistency, mobility, non-tender              Adnexa: no mass, fullness, tenderness  Chaperone was present for exam.  Assessment: Recurrent UTI H/o macrobid resistance  Plan: Bactrim DS bid x 3 days Urine micro and culture pending Will start trimethoprim 50mg  daily after culture is back.

## 2018-12-26 NOTE — Telephone Encounter (Signed)
Routing to Vici to call pt to see if she can come ASAP so I can see her.

## 2018-12-26 NOTE — Telephone Encounter (Signed)
Patient is calling regarding possible UTI. Patient is experiencing urgency and burning. Patient denies fever or chills, but "does not feel all that great."

## 2018-12-26 NOTE — Telephone Encounter (Signed)
Scheduled patient today at 12:00pm

## 2018-12-26 NOTE — Telephone Encounter (Signed)
Call to patient. Patient states her symptoms of stinging and burning "like pins and needles" started last Tuesday, but she kept putting off being seen. Patient states she took Uribel on Saturday, but that the urgency has really started to "kick in today." Patient denies fever or chills, but states, "I just don't feel good." RN advised would need to review schedule with Dr. Sabra Heck and return call. Patient agreeable.   Routing to provider for review.

## 2018-12-27 ENCOUNTER — Telehealth (HOSPITAL_COMMUNITY): Payer: Self-pay | Admitting: Rehabilitation

## 2018-12-27 LAB — URINALYSIS, MICROSCOPIC ONLY: Casts: NONE SEEN /lpf

## 2018-12-27 NOTE — Telephone Encounter (Signed)

## 2018-12-28 ENCOUNTER — Encounter: Payer: Self-pay | Admitting: Vascular Surgery

## 2018-12-28 ENCOUNTER — Other Ambulatory Visit: Payer: Self-pay

## 2018-12-28 ENCOUNTER — Ambulatory Visit (INDEPENDENT_AMBULATORY_CARE_PROVIDER_SITE_OTHER): Payer: Medicare Other | Admitting: Vascular Surgery

## 2018-12-28 VITALS — BP 126/79 | HR 76 | Temp 98.0°F | Resp 18 | Ht 66.0 in | Wt 154.4 lb

## 2018-12-28 DIAGNOSIS — I8393 Asymptomatic varicose veins of bilateral lower extremities: Secondary | ICD-10-CM | POA: Diagnosis not present

## 2018-12-28 LAB — URINE CULTURE

## 2018-12-28 MED ORDER — TRIMETHOPRIM 100 MG PO TABS: ORAL_TABLET | ORAL | 1 refills | Status: DC

## 2018-12-28 NOTE — Addendum Note (Signed)
Addended by: Megan Salon on: 12/28/2018 03:55 PM   Modules accepted: Orders

## 2018-12-28 NOTE — Progress Notes (Signed)
Patient name: [name] MRN: [id number] DOB: May 30, 1945 Sex: female  REASON FOR VISIT:   Follow-up of painful varicose veins.  HPI:   [name] is a pleasant 73 y.o. female who was seen in our office by Dr. Sherren Mocha early on 06/13/2018 with painful varicose veins.  She had progressive discomfort related to some large varicosities along her medial right thigh and right calf.  Of note, she has heterozygous factor V Leiden positive.  Duplex scan at that time showed reflux in both great saphenous veins and also the small saphenous veins bilaterally.  The right great saphenous vein and left small saphenous vein were markedly dilated.  Her symptoms were tolerable at that time.  She was fitted for 20 to 30 mm thigh-high compression stockings and the importance of leg elevation and ibuprofen as needed for pain were discussed.  She comes in for a 16-month follow-up visit.  It was felt that if she failed conservative treatment she would be a good candidate for laser ablation of her right great saphenous vein with stab phlebectomies.  On my history the patient has had a long history of aching pain in both lower extremities which is aggravated by sitting and standing and relieved somewhat with elevation.  Her symptoms are more significant on the right side where she has some large varicose veins.  She did have an episode of superficial venous thrombosis in the right leg back in 1997.  She is had no history of deep venous thrombosis.  She has been wearing her thigh-high compression stockings which do help some.  She has been elevating her legs.  She takes ibuprofen as needed for pain.  She does describe some tingling and paresthesias over a large cluster of varicose veins in her medial right thigh.  Past Medical History:  Diagnosis Date  . Contact lens/glasses fitting    wears one contact lt eye  . Heterozygous factor V Leiden mutation ([place])   . Osteopenia   . PONV (postoperative nausea and vomiting)   .  Superficial vein thrombosis 1997  . Varicose veins of both lower extremities     Family History  Problem Relation Age of Onset  . Heart disease Mother   . Hyperlipidemia Mother   . Thyroid disease Mother   . Osteoporosis Mother   . Dementia Mother   . Breast cancer Mother 55  . Diabetes Father   . Heart disease Father   . Cancer Father        prostate  . Cancer Brother        Eye    SOCIAL HISTORY: Social History   Tobacco Use  . Smoking status: Never Smoker  . Smokeless tobacco: Never Used  Substance Use Topics  . Alcohol use: Yes    Alcohol/week: 1.0 standard drinks    Types: 1 Standard drinks or equivalent per week    Comment: glass of wine    Allergies  Allergen Reactions  . Ofloxacin Itching and Swelling    Current Outpatient Medications  Medication Sig Dispense Refill  . acetaminophen (TYLENOL) 500 MG tablet Take 500 mg by mouth every 4 (four) hours as needed for mild pain.    . calcium carbonate (OS-CAL) 600 MG TABS tablet Take 600 mg by mouth 2 (two) times daily with a meal.    . calcium carbonate (TUMS - DOSED IN MG ELEMENTAL CALCIUM) 500 MG chewable tablet Chew 1 tablet by mouth as needed for indigestion or heartburn.    Marland Kitchen  fish oil-omega-3 fatty acids 1000 MG capsule Take 2 g by mouth daily.    . Flaxseed, Linseed, (FLAX SEED OIL PO) Take 1 capsule by mouth daily.     Marland Kitchen ibuprofen (ADVIL,MOTRIN) 200 MG tablet Take 200 mg by mouth every 6 (six) hours as needed for moderate pain.    . Multiple Vitamins-Minerals (MULTIVITAMIN WITH MINERALS) tablet Take 1 tablet by mouth daily.    . naproxen sodium (ANAPROX) 220 MG tablet Take 220 mg by mouth daily.     . Plant Sterols and Stanols (CHOLESTOFF PO) Take by mouth daily.     Marland Kitchen sulfamethoxazole-trimethoprim (BACTRIM DS) 800-160 MG tablet Take 1 tablet by mouth 2 (two) times daily. 6 tablet 0  . TURMERIC PO Take by mouth daily.    . vitamin C (ASCORBIC ACID) 500 MG tablet Take 500 mg by mouth daily.    .  NONFORMULARY OR COMPOUNDED ITEM Estradiol cream 0.02% in 16ml prefilled syringes.  62ml pv twice weekly.  #74month supply. (Patient not taking: Reported on 12/28/2018) 1 each 3   No current facility-administered medications for this visit.     REVIEW OF SYSTEMS:  [X]  denotes positive finding, [ ]  denotes negative finding Cardiac  Comments:  Chest pain or chest pressure:    Shortness of breath upon exertion:    Short of breath when lying flat:    Irregular heart rhythm:        Vascular    Pain in calf, thigh, or hip brought on by ambulation:    Pain in feet at night that wakes you up from your sleep:     Blood clot in your veins:    Leg swelling:         Pulmonary    Oxygen at home:    Productive cough:     Wheezing:         Neurologic    Sudden weakness in arms or legs:     Sudden numbness in arms or legs:     Sudden onset of difficulty speaking or slurred speech:    Temporary loss of vision in one eye:     Problems with dizziness:         Gastrointestinal    Blood in stool:     Vomited blood:         Genitourinary    Burning when urinating:     Blood in urine:        Psychiatric    Major depression:         Hematologic    Bleeding problems:    Problems with blood clotting too easily:        Skin    Rashes or ulcers:        Constitutional    Fever or chills:     PHYSICAL EXAM:   Vitals:   12/28/18 1331  BP: 126/79  Pulse: 76  Resp: 18  Temp: 98 F (36.7 C)  TempSrc: Temporal  SpO2: 97%  Weight: 154 lb 6.4 oz (70 kg)  Height: 5\' 6"  (1.676 m)    GENERAL: The patient is a well-nourished female, in no acute distress. The vital signs are documented above. CARDIAC: There is a regular rate and rhythm.  VASCULAR: I do not detect carotid bruits. She has palpable pedal pulses bilaterally. VENOUS EXAM: She has some large varicose veins along her medial right thigh and medial right calf as documented below.       I did look at her right  great saphenous  vein myself with the SonoSite.  The vein is superficial but then becomes deep in the proximal third of the thigh.  She has significant reflux here and the vein is significantly dilated.  Of note this does feed this large cluster of varicose veins in her medial right thigh.  The left great saphenous vein is not significantly dilated.  The left short saphenous vein is dilated with reflux but is fairly superficial and appears to potentially connect with an anterior accessory saphenous vein.  PULMONARY: There is good air exchange bilaterally without wheezing or rales. ABDOMEN: Soft and non-tender with normal pitched bowel sounds.  MUSCULOSKELETAL: There are no major deformities or cyanosis. NEUROLOGIC: No focal weakness or paresthesias are detected. SKIN: There are no ulcers or rashes noted. PSYCHIATRIC: The patient has a normal affect.  DATA:    VENOUS DUPLEX: I have independently interpreted her venous duplex scan today.  On the right side there is no evidence of DVT.  There is no superficial venous thrombosis.  There is deep venous reflux involving the common femoral vein.  There is superficial venous reflux involving the right great saphenous vein and right small saphenous vein.  The right great saphenous vein is significantly dilated.  On the left side there is no evidence of DVT or superficial venous thrombosis.  There is no significant deep venous reflux.  There is reflux in the left great saphenous vein although the vein is not especially dilated.  MEDICAL ISSUES:   CHRONIC VENOUS INSUFFICIENCY: The patient does have evidence of significant venous reflux bilaterally.  We have discussed the importance of intermittent leg elevation and the proper positioning for this.  In addition she will continue to wear her compression stockings.  I have encouraged her to avoid prolonged sitting and standing.  We discussed the importance of exercise specifically walking and water aerobics.  If her symptoms  progress then I think she would be a good candidate for laser ablation of the right great saphenous vein and 10-20 stabs.  She will call if her symptoms progress.  Deitra Mayo [facility] [phone]

## 2019-01-23 ENCOUNTER — Encounter: Payer: Self-pay | Admitting: Obstetrics & Gynecology

## 2019-01-23 ENCOUNTER — Other Ambulatory Visit: Payer: Self-pay

## 2019-01-23 ENCOUNTER — Ambulatory Visit (INDEPENDENT_AMBULATORY_CARE_PROVIDER_SITE_OTHER): Payer: Medicare Other | Admitting: Obstetrics & Gynecology

## 2019-01-23 ENCOUNTER — Telehealth: Payer: Self-pay | Admitting: Obstetrics & Gynecology

## 2019-01-23 VITALS — BP 138/82 | HR 68 | Temp 97.4°F | Ht 66.0 in | Wt 153.0 lb

## 2019-01-23 DIAGNOSIS — R300 Dysuria: Secondary | ICD-10-CM

## 2019-01-23 LAB — POCT URINALYSIS DIPSTICK
Bilirubin, UA: NEGATIVE
Blood, UA: NEGATIVE
Glucose, UA: NEGATIVE
Ketones, UA: NEGATIVE
Nitrite, UA: NEGATIVE
Protein, UA: NEGATIVE
Urobilinogen, UA: 0.2 E.U./dL
pH, UA: 5 (ref 5.0–8.0)

## 2019-01-23 MED ORDER — SULFAMETHOXAZOLE-TRIMETHOPRIM 800-160 MG PO TABS: 1.0000 | ORAL_TABLET | Freq: Two times a day (BID) | ORAL | 0 refills | Status: DC

## 2019-01-23 NOTE — Telephone Encounter (Signed)
Spoke with patient, advised per Dr. Sabra Heck. [id number] prescreen negative, precautions reviewed. OV scheduled for today at 11:45am with Dr. Sabra Heck. Patient verbalizes understanding and is agreeable.   Encounter closed.

## 2019-01-23 NOTE — Progress Notes (Signed)
GYNECOLOGY  VISIT  CC:   Dysuria, urgency  HPI: 73 y.o. G2P2 Married White or Caucasian female here for recurrent UTI.  Was treated 12/26/2018 with Bactrim DS BID x 3.  Pt reports symptoms did improve but never really went away.  She reports yesterday, she started having the "pins and needles" sensation.  She does not have any back pain or pelvic pain.  Denies blood in urine.  Denies vaginal bleeding or discharge.  Denies fever or chills.  GYNECOLOGIC HISTORY: No LMP recorded. Patient has had a hysterectomy. Contraception: PMP Menopausal hormone therapy: none  Patient Active Problem List   Diagnosis Date Noted  . Primary osteoarthritis of both first carpometacarpal joints 05/31/2018  . Osteoporosis 12/04/2015  . Bartholin's gland abscess 03/08/2014  . ASTHMA 10/05/2007  . OSTEOARTHRITIS 10/05/2007    Past Medical History:  Diagnosis Date  . Contact lens/glasses fitting    wears one contact lt eye  . Heterozygous factor V Leiden mutation ([place])   . Osteopenia   . PONV (postoperative nausea and vomiting)   . Superficial vein thrombosis 1997  . Varicose veins of both lower extremities     Past Surgical History:  Procedure Laterality Date  . BARTHOLIN CYST MARSUPIALIZATION    . BARTHOLIN CYST MARSUPIALIZATION Right 08/19/2014   Procedure: BARTHOLIN CYST MARSUPIALIZATION;  Surgeon: Megan Salon, MD;  Location: [facility];  Service: Gynecology;  Laterality: Right;  . CARPAL TUNNEL RELEASE Right 12/20/2012   Procedure: CARPAL TUNNEL RELEASE;  Surgeon: Wynonia Sours, MD;  Location: [place];  Service: Orthopedics;  Laterality: Right;  . CATARACT EXTRACTION Right 2009      . [place]   x2  . EAR CYST EXCISION Left 08/15/2014   Procedure: LEFT THUMB DEBRIDEMENT INTERPHALANGEAL JOINT LEFT THUMB REMOVAL MUCOID CYST ;  Surgeon: Daryll Brod, MD;  Location: [facility];  Service: Orthopedics;  Laterality: Left;  . FINGER SURGERY  2002   lt long  finger mass  . GANGLION CYST EXCISION Left 2/02  . TOTAL ABDOMINAL HYSTERECTOMY W/ BILATERAL SALPINGOOPHORECTOMY  1997  . TUBAL LIGATION  1977    MEDS:   Current Outpatient Medications on File Prior to Visit  Medication Sig Dispense Refill  . acetaminophen (TYLENOL) 500 MG tablet Take 500 mg by mouth every 4 (four) hours as needed for mild pain.    . calcium carbonate (OS-CAL) 600 MG TABS tablet Take 600 mg by mouth 2 (two) times daily with a meal.    . calcium carbonate (TUMS - DOSED IN MG ELEMENTAL CALCIUM) 500 MG chewable tablet Chew 1 tablet by mouth as needed for indigestion or heartburn.    . fish oil-omega-3 fatty acids 1000 MG capsule Take 2 g by mouth daily.    . Flaxseed, Linseed, (FLAX SEED OIL PO) Take 1 capsule by mouth daily.     Marland Kitchen ibuprofen (ADVIL,MOTRIN) 200 MG tablet Take 200 mg by mouth every 6 (six) hours as needed for moderate pain.    . Multiple Vitamins-Minerals (MULTIVITAMIN WITH MINERALS) tablet Take 1 tablet by mouth daily.    . naproxen sodium (ANAPROX) 220 MG tablet Take 220 mg by mouth daily.     . Plant Sterols and Stanols (CHOLESTOFF PO) Take by mouth daily.     Marland Kitchen trimethoprim (TRIMPEX) 100 MG tablet 1/2 tab (50mg ) daily.  If tablet cannot be split, ok to take 1 tab daily (100mg ) for UTI prevention 90 tablet 1  . TURMERIC PO Take  by mouth daily.    . vitamin C (ASCORBIC ACID) 500 MG tablet Take 500 mg by mouth daily.    . NONFORMULARY OR COMPOUNDED ITEM Estradiol cream 0.02% in 81ml prefilled syringes.  55ml pv twice weekly.  #57month supply. (Patient not taking: Reported on 12/28/2018) 1 each 3   No current facility-administered medications on file prior to visit.     ALLERGIES: Ofloxacin  Family History  Problem Relation Age of Onset  . Heart disease Mother   . Hyperlipidemia Mother   . Thyroid disease Mother   . Osteoporosis Mother   . Dementia Mother   . Breast cancer Mother 38  . Diabetes Father   . Heart disease Father   . Cancer Father         prostate  . Cancer Brother        Eye    SH:  Married, non smoker  Review of Systems  Genitourinary: Positive for dysuria, frequency and urgency.  All other systems reviewed and are negative.   PHYSICAL EXAMINATION:    BP 138/82   Pulse 68   Temp (!) 97.4 F (36.3 C) (Temporal)   Ht 5\' 6"  (1.676 m)   Wt 153 lb (69.4 kg)   BMI 24.69 kg/m     General appearance: alert, cooperative and appears stated age Flank:  No CVA Abdomen: soft, non-tender; bowel sounds normal; no masses,  no organomegaly  Assessment: Recurrent UTI Probably not fully treated UTI from September  Plan: Urine culture and micro pending Bactrim DS BID x 7 days Plan urine culture in two weeks. Will restart trimethoprim after this is treated   ~15 minutes spent with patient >50% of time was in face to face discussion of above.

## 2019-01-23 NOTE — Telephone Encounter (Signed)
Spoke with patient. Seen in office on 12/26/18, Tx for Ecoli UTI with bactrim DS bid x3 days, then switched to trimethoprim 50 mg daily. Symptoms never completely resolved, symptoms are now "worse". Reports dysuria, urgency and voiding small amounts. Denies flank pain, fever/chills, blood in urine.   Advised patient I will review with Dr. Sabra Heck and return call with recommendations. Patient is available today for OV for nurse visit for repeat UC.   Dr. Sabra Heck -please advise.

## 2019-01-23 NOTE — Telephone Encounter (Signed)
If she can come at 11:45, please have her come.  T

## 2019-01-23 NOTE — Telephone Encounter (Signed)
Patient asked to talk with a nurse about her recurring uti symptoms.

## 2019-01-24 DIAGNOSIS — M180 Bilateral primary osteoarthritis of first carpometacarpal joints: Secondary | ICD-10-CM | POA: Diagnosis not present

## 2019-01-24 LAB — URINALYSIS, MICROSCOPIC ONLY
Casts: NONE SEEN /lpf
Epithelial Cells (non renal): NONE SEEN /hpf (ref 0–10)

## 2019-01-25 LAB — URINE CULTURE

## 2019-01-26 ENCOUNTER — Other Ambulatory Visit: Payer: Self-pay | Admitting: *Deleted

## 2019-01-26 DIAGNOSIS — N390 Urinary tract infection, site not specified: Secondary | ICD-10-CM

## 2019-01-26 MED ORDER — NITROFURANTOIN MONOHYD MACRO 100 MG PO CAPS: 100.0000 mg | ORAL_CAPSULE | Freq: Two times a day (BID) | ORAL | 0 refills | Status: AC

## 2019-02-02 ENCOUNTER — Telehealth: Payer: Self-pay | Admitting: Obstetrics & Gynecology

## 2019-02-02 MED ORDER — NITROFURANTOIN MONOHYD MACRO 100 MG PO CAPS: 100.0000 mg | ORAL_CAPSULE | Freq: Two times a day (BID) | ORAL | 0 refills | Status: AC

## 2019-02-02 NOTE — Telephone Encounter (Signed)
Urine culture was resistant to bactrim and she is allergic to ofloxacin.  Ok to continue Macrobid 100mg  bid x 5 more days.  Ok to sent to pharmacy.  Please have her give update at the beginning of the week.  Thanks.

## 2019-02-02 NOTE — Telephone Encounter (Signed)
Spoke with patient. Advised as seen below per Dr. Sabra Heck. RX to verified pharmacy. Patient verbalizes understanding and is agreeable.   Encounter closed.

## 2019-02-02 NOTE — Telephone Encounter (Signed)
Spoke with patient. Calling to provide update. Seen in office of 9/15. Tx for e coli UTI. Completed macrobid 100 mg bid x5 days on Tuesday. Feels better, reports dysuria with first urine in the mornings. Denies any other symptoms. Was advised to call if not resolved.   Rpt UC scheduled for 10/7. Trimethoprim after UC.   Advised I will update Dr. Sabra Heck and return call with recommendations. Patient agreeable.   Dr. Sabra Heck -please advise.

## 2019-02-02 NOTE — Telephone Encounter (Signed)
Patient seen 01/23/19 for UTI. Finished antibiotic on Tuesday 9/22. She is still having pain first thing in the AM after finishing antibiotic. Please advise.

## 2019-02-13 ENCOUNTER — Other Ambulatory Visit: Payer: Self-pay

## 2019-02-13 ENCOUNTER — Encounter: Payer: Self-pay | Admitting: Obstetrics & Gynecology

## 2019-02-13 ENCOUNTER — Ambulatory Visit (INDEPENDENT_AMBULATORY_CARE_PROVIDER_SITE_OTHER): Payer: Medicare Other | Admitting: Obstetrics & Gynecology

## 2019-02-13 ENCOUNTER — Telehealth: Payer: Self-pay | Admitting: Obstetrics & Gynecology

## 2019-02-13 VITALS — BP 126/80 | HR 64 | Temp 96.7°F | Ht 66.0 in | Wt 153.0 lb

## 2019-02-13 DIAGNOSIS — N390 Urinary tract infection, site not specified: Secondary | ICD-10-CM | POA: Diagnosis not present

## 2019-02-13 DIAGNOSIS — N3090 Cystitis, unspecified without hematuria: Secondary | ICD-10-CM

## 2019-02-13 DIAGNOSIS — Z23 Encounter for immunization: Secondary | ICD-10-CM | POA: Diagnosis not present

## 2019-02-13 LAB — POCT URINALYSIS DIPSTICK
Bilirubin, UA: NEGATIVE
Blood, UA: NEGATIVE
Glucose, UA: NEGATIVE
Ketones, UA: NEGATIVE
Nitrite, UA: NEGATIVE
Protein, UA: NEGATIVE
Urobilinogen, UA: 0.2 E.U./dL
pH, UA: 6 (ref 5.0–8.0)

## 2019-02-13 MED ORDER — AMOXICILLIN-POT CLAVULANATE 875-125 MG PO TABS: 1.0000 | ORAL_TABLET | Freq: Two times a day (BID) | ORAL | 0 refills | Status: DC

## 2019-02-13 NOTE — Addendum Note (Signed)
Addended by: Lowella Fairy on: 02/13/2019 01:20 PM   Modules accepted: Orders

## 2019-02-13 NOTE — Telephone Encounter (Signed)
Patient states she has finished antibiotics and is still experiencing urinary urgency, burning and occassional pain. Please advise.

## 2019-02-13 NOTE — Progress Notes (Signed)
GYNECOLOGY  VISIT  CC:   Recurrent UTI  HPI: 73 y.o. G2P2 Married White or Caucasian female here for dysuria and urgency.  She has hx of recurrent UTI and has been treated with bactrim x 3 days starting 12/26/2018.  Pt felt symtoms never fully resolved but did not call until mid September to report this.  Repeat urine culture was obtained showing e coli.  She was treated for 7 days with Bactrim DS and finished this a week ago.  She continued to have symptoms of a UTI that worsened yesterday.  At times, she feels like she is urinating "pins" due to the pain.  Is trying to make she sure completely empties her bladder.  Has seen no blood.  Denies back pain, pelvic pain, fever.  Does want a flu shot today.  Reviewed contraindications to ensure that UTI is not one.    GYNECOLOGIC HISTORY: No LMP recorded. Patient has had a hysterectomy. Contraception: PMP Menopausal hormone therapy: Estradiol cream   Patient Active Problem List   Diagnosis Date Noted  . Primary osteoarthritis of both first carpometacarpal joints 05/31/2018  . Osteoporosis 12/04/2015  . Bartholin's gland abscess 03/08/2014  . ASTHMA 10/05/2007  . OSTEOARTHRITIS 10/05/2007    Past Medical History:  Diagnosis Date  . Contact lens/glasses fitting    wears one contact lt eye  . Heterozygous factor V Leiden mutation ([place])   . Osteopenia   . PONV (postoperative nausea and vomiting)   . Superficial vein thrombosis 1997  . Varicose veins of both lower extremities     Past Surgical History:  Procedure Laterality Date  . BARTHOLIN CYST MARSUPIALIZATION    . BARTHOLIN CYST MARSUPIALIZATION Right 08/19/2014   Procedure: BARTHOLIN CYST MARSUPIALIZATION;  Surgeon: Megan Salon, MD;  Location: [facility];  Service: Gynecology;  Laterality: Right;  . CARPAL TUNNEL RELEASE Right 12/20/2012   Procedure: CARPAL TUNNEL RELEASE;  Surgeon: Wynonia Sours, MD;  Location: [place];  Service: Orthopedics;  Laterality: Right;  .  CATARACT EXTRACTION Right 2009      . [place]   x2  . EAR CYST EXCISION Left 08/15/2014   Procedure: LEFT THUMB DEBRIDEMENT INTERPHALANGEAL JOINT LEFT THUMB REMOVAL MUCOID CYST ;  Surgeon: Daryll Brod, MD;  Location: [facility];  Service: Orthopedics;  Laterality: Left;  . FINGER SURGERY  2002   lt long finger mass  . GANGLION CYST EXCISION Left 2/02  . TOTAL ABDOMINAL HYSTERECTOMY W/ BILATERAL SALPINGOOPHORECTOMY  1997  . TUBAL LIGATION  1977    MEDS:   Current Outpatient Medications on File Prior to Visit  Medication Sig Dispense Refill  . acetaminophen (TYLENOL) 500 MG tablet Take 500 mg by mouth every 4 (four) hours as needed for mild pain.    . calcium carbonate (OS-CAL) 600 MG TABS tablet Take 600 mg by mouth 2 (two) times daily with a meal.    . calcium carbonate (TUMS - DOSED IN MG ELEMENTAL CALCIUM) 500 MG chewable tablet Chew 1 tablet by mouth as needed for indigestion or heartburn.    . fish oil-omega-3 fatty acids 1000 MG capsule Take 2 g by mouth daily.    . Flaxseed, Linseed, (FLAX SEED OIL PO) Take 1 capsule by mouth daily.     Marland Kitchen ibuprofen (ADVIL,MOTRIN) 200 MG tablet Take 200 mg by mouth every 6 (six) hours as needed for moderate pain.    . Multiple Vitamins-Minerals (MULTIVITAMIN WITH MINERALS) tablet Take 1 tablet by  mouth daily.    . naproxen sodium (ANAPROX) 220 MG tablet Take 220 mg by mouth daily.     . Plant Sterols and Stanols (CHOLESTOFF PO) Take by mouth daily.     . TURMERIC PO Take by mouth daily.    . vitamin C (ASCORBIC ACID) 500 MG tablet Take 500 mg by mouth daily.    . NONFORMULARY OR COMPOUNDED ITEM Estradiol cream 0.02% in 29ml prefilled syringes.  58ml pv twice weekly.  #22month supply. (Patient not taking: Reported on 12/28/2018) 1 each 3  . trimethoprim (TRIMPEX) 100 MG tablet 1/2 tab (50mg ) daily.  If tablet cannot be split, ok to take 1 tab daily (100mg ) for UTI prevention (Patient not taking: Reported on 02/13/2019) 90  tablet 1   No current facility-administered medications on file prior to visit.     ALLERGIES: Ofloxacin  Family History  Problem Relation Age of Onset  . Heart disease Mother   . Hyperlipidemia Mother   . Thyroid disease Mother   . Osteoporosis Mother   . Dementia Mother   . Breast cancer Mother 31  . Diabetes Father   . Heart disease Father   . Cancer Father        prostate  . Cancer Brother        Eye    SH:  Married, non smoker  Review of Systems  Genitourinary: Positive for dysuria and urgency.  All other systems reviewed and are negative.   PHYSICAL EXAMINATION:    BP 126/80   Pulse 64   Temp (!) 96.7 F (35.9 C) (Temporal)   Ht 5\' 6"  (1.676 m)   Wt 153 lb (69.4 kg)   BMI 24.69 kg/m     General appearance: alert, cooperative and appears stated age Flank:  No CVA tenderness Abdomen: soft, non-tender; bowel sounds normal; no masses,  no organomegaly Lymph:  no inguinal LAD noted  Pelvic: External genitalia:  no lesions              Urethra:  normal appearing urethra with no masses, tenderness or lesions              Bartholins and Skenes: normal                 Vagina: normal appearing vagina with normal color and discharge, no lesions              Cervix: absent              Bimanual Exam:  Uterus:  uterus absent              Adnexa: no mass, fullness, tenderness  Chaperone was present for exam.  Assessment: Recurrent UTI with continued symptoms today Leukocytes in urine micro  Plan: Augmentin 875 bid x 7 day to pharmacy Repeat urine culture Will refer to urology at this time If this relieves symptoms, she will transition to suppressive therapy with trimethoprim.  She already has rx.

## 2019-02-13 NOTE — Telephone Encounter (Signed)
Call to patient. Patient states that she finished the bactrim last Tuesday, 02-06-2019. States her symptoms "never totally went away." Complaining of urinary urgency, burning and urethral pain. States, "I feel like I'm peeing pins." Denies fever. Also states she hasn't had intercourse since taking the antibiotic to see if intercourse was contributing. Staying hydrated and has cut down on diet pepsi intake. RN advised OV recommended. Patient agreeable. Patient scheduled for 02-13-2019 at 1130 with Dr. Sabra Heck. Covid prescreening negative.   Routing to provider and will close encounter.

## 2019-02-14 ENCOUNTER — Ambulatory Visit: Payer: Medicare Other

## 2019-02-14 LAB — URINALYSIS, MICROSCOPIC ONLY
Bacteria, UA: NONE SEEN
Casts: NONE SEEN /lpf
Epithelial Cells (non renal): NONE SEEN /hpf (ref 0–10)

## 2019-02-15 LAB — URINE CULTURE

## 2019-02-16 ENCOUNTER — Other Ambulatory Visit: Payer: Self-pay | Admitting: *Deleted

## 2019-02-16 DIAGNOSIS — N390 Urinary tract infection, site not specified: Secondary | ICD-10-CM

## 2019-02-20 ENCOUNTER — Ambulatory Visit: Payer: Medicare Other

## 2019-02-20 ENCOUNTER — Telehealth: Payer: Self-pay | Admitting: Obstetrics & Gynecology

## 2019-02-20 NOTE — Telephone Encounter (Signed)
Spoke with patient. OV scheduled for 10/15 at 1:30pm with Dr. Sabra Heck. Advised patient I will review with Dr. Sabra Heck on 10/14 and return call if earlier OV needed. Patient verbalizes understanding and is agreeable.   Dr. Sabra Heck -ok to proceed as scheduled?

## 2019-02-20 NOTE — Telephone Encounter (Signed)
Please have the patient return to see Dr. Sabra Heck.

## 2019-02-20 NOTE — Telephone Encounter (Signed)
Spoke with patient. Hx of recurrent UTI   12/26/18 -e coli UTI, tx with Bactrim x3 d  01/23/19 -e coli UTI, tx with Bactrim x7 d  02/13/19 - e coli UTI, tx with Augmentin 875 bid x7 d, completed on 10/12.   Symptoms have not completely resolved. No longer "peeing pins and needles, just feels like a slight glow". Some urgency and not completely emptying bladder. Has reduced caffeine.  Denies any other symptoms. Urology referral pending. Repeat UC scheduled for 10/20.   Advised patient I will have to review with covering provider and return call with recommendations, patient agreeable.   Dr. Quincy Simmonds -please review and advise.

## 2019-02-20 NOTE — Telephone Encounter (Signed)
Patient calling with update after finishing antibiotics for UTI yesterday. States she still has something going on, she's at "about 85%".

## 2019-02-21 NOTE — Telephone Encounter (Signed)
Yes.  Have her keep appt for tomorrow.  Is there any way to check on the referral appt to urology?  Will route to [facility] as well.  Thanks.

## 2019-02-21 NOTE — Telephone Encounter (Signed)
Reviewed with Magdalene Patricia. Patient is scheduled at [facility] on 03/19/19 at 1:30pm with Dr. Diona Fanti.   Call returned to patient. Advised of appt as seen above.  Patient states she does feel much better today, but she did take a AZO this morning. Patient asking if OV still needed? Recommended patient keep OV as scheduled for 10/15, call with update on 10/15, will review with Dr. Sabra Heck at that time. Can cancel if needed. Patient is also scheduled for repeat UC on 10/20.   Patient verbalizes understanding and is agreeable.   Routing to Dr. Lestine Box.   Cc: Magdalene Patricia

## 2019-02-22 ENCOUNTER — Ambulatory Visit (INDEPENDENT_AMBULATORY_CARE_PROVIDER_SITE_OTHER): Payer: Medicare Other | Admitting: Obstetrics & Gynecology

## 2019-02-22 ENCOUNTER — Encounter: Payer: Self-pay | Admitting: Obstetrics & Gynecology

## 2019-02-22 ENCOUNTER — Other Ambulatory Visit: Payer: Self-pay

## 2019-02-22 VITALS — BP 124/70 | HR 70 | Temp 97.3°F | Resp 14 | Ht 66.0 in | Wt 154.4 lb

## 2019-02-22 DIAGNOSIS — N390 Urinary tract infection, site not specified: Secondary | ICD-10-CM | POA: Diagnosis not present

## 2019-02-22 LAB — POCT URINALYSIS DIPSTICK
Bilirubin, UA: NEGATIVE
Blood, UA: NEGATIVE
Glucose, UA: NEGATIVE
Ketones, UA: NEGATIVE
Leukocytes, UA: NEGATIVE
Nitrite, UA: NEGATIVE
Protein, UA: NEGATIVE
Urobilinogen, UA: 0.2 E.U./dL
pH, UA: 5.5 (ref 5.0–8.0)

## 2019-02-22 MED ORDER — NITROFURANTOIN MONOHYD MACRO 100 MG PO CAPS: 100.0000 mg | ORAL_CAPSULE | Freq: Two times a day (BID) | ORAL | 0 refills | Status: DC

## 2019-02-22 NOTE — Telephone Encounter (Signed)
Call reviewed with Dr. Sabra Heck, call returned to patient. Patient will keep OV as scheduled for today at 1:30pm. Patient verbalizes understanding and is agreeable to plan.   Routing to provider for final review. Patient is agreeable to disposition. Will close encounter.

## 2019-02-22 NOTE — Progress Notes (Signed)
GYNECOLOGY  VISIT  CC:   Urinary urgency  HPI: 73 y.o. G2P2 Married White or Caucasian female here for follow up from UTI. She states she completed the antibiotic but still feels urgency and like she does not empty her bladder completely.  She has been treated with three rounds of antibiotics including bactrim x 3, bactrim again x 7 and then Augmentin x 7.  She is not having any vaginal discharge or itching.  Denies vaginal bleeding.    She is going to [place] for the weekend and is not particularly close to a pharmacy when she is at her cabin.    POCT urinalysis is negative today.  This is the first time since August that there have not been WBCs in her urine.   She does have urology consultation scheduled for 03/19/2019.  GYNECOLOGIC HISTORY: No LMP recorded. Patient has had a hysterectomy. Contraception: PMP Menopausal hormone therapy: n/a  Patient Active Problem List   Diagnosis Date Noted  . Primary osteoarthritis of both first carpometacarpal joints 05/31/2018  . Osteoporosis 12/04/2015  . Bartholin's gland abscess 03/08/2014  . ASTHMA 10/05/2007  . OSTEOARTHRITIS 10/05/2007    Past Medical History:  Diagnosis Date  . Contact lens/glasses fitting    wears one contact lt eye  . Heterozygous factor V Leiden mutation ([place])   . Osteopenia   . PONV (postoperative nausea and vomiting)   . Superficial vein thrombosis 1997  . Varicose veins of both lower extremities     Past Surgical History:  Procedure Laterality Date  . BARTHOLIN CYST MARSUPIALIZATION    . BARTHOLIN CYST MARSUPIALIZATION Right 08/19/2014   Procedure: BARTHOLIN CYST MARSUPIALIZATION;  Surgeon: Megan Salon, MD;  Location: [facility];  Service: Gynecology;  Laterality: Right;  . CARPAL TUNNEL RELEASE Right 12/20/2012   Procedure: CARPAL TUNNEL RELEASE;  Surgeon: Wynonia Sours, MD;  Location: [facility];  Service: Orthopedics;  Laterality: Right;  . CATARACT EXTRACTION Right 2009      . [place]   x2  . EAR CYST EXCISION Left 08/15/2014   Procedure: LEFT THUMB DEBRIDEMENT INTERPHALANGEAL JOINT LEFT THUMB REMOVAL MUCOID CYST ;  Surgeon: Daryll Brod, MD;  Location: [facility];  Service: Orthopedics;  Laterality: Left;  . FINGER SURGERY  2002   lt long finger mass  . GANGLION CYST EXCISION Left 2/02  . TOTAL ABDOMINAL HYSTERECTOMY W/ BILATERAL SALPINGOOPHORECTOMY  1997  . TUBAL LIGATION  1977    MEDS:   Current Outpatient Medications on File Prior to Visit  Medication Sig Dispense Refill  . acetaminophen (TYLENOL) 500 MG tablet Take 500 mg by mouth every 4 (four) hours as needed for mild pain.    . calcium carbonate (OS-CAL) 600 MG TABS tablet Take 600 mg by mouth 2 (two) times daily with a meal.    . calcium carbonate (TUMS - DOSED IN MG ELEMENTAL CALCIUM) 500 MG chewable tablet Chew 1 tablet by mouth as needed for indigestion or heartburn.    . fish oil-omega-3 fatty acids 1000 MG capsule Take 2 g by mouth daily.    . Flaxseed, Linseed, (FLAX SEED OIL PO) Take 1 capsule by mouth daily.     Marland Kitchen ibuprofen (ADVIL,MOTRIN) 200 MG tablet Take 200 mg by mouth every 6 (six) hours as needed for moderate pain.    . Multiple Vitamins-Minerals (MULTIVITAMIN WITH MINERALS) tablet Take 1 tablet by mouth daily.    . naproxen sodium (ANAPROX) 220 MG tablet  Take 220 mg by mouth daily.     . Plant Sterols and Stanols (CHOLESTOFF PO) Take by mouth daily.     . TURMERIC PO Take by mouth daily.    . vitamin C (ASCORBIC ACID) 500 MG tablet Take 500 mg by mouth daily.    . NONFORMULARY OR COMPOUNDED ITEM Estradiol cream 0.02% in 46ml prefilled syringes.  45ml pv twice weekly.  #40month supply. (Patient not taking: Reported on 12/28/2018) 1 each 3  . trimethoprim (TRIMPEX) 100 MG tablet 1/2 tab (50mg ) daily.  If tablet cannot be split, ok to take 1 tab daily (100mg ) for UTI prevention (Patient not taking: Reported on 02/13/2019) 90 tablet 1   No current facility-administered  medications on file prior to visit.     ALLERGIES: Ofloxacin  Family History  Problem Relation Age of Onset  . Heart disease Mother   . Hyperlipidemia Mother   . Thyroid disease Mother   . Osteoporosis Mother   . Dementia Mother   . Breast cancer Mother 51  . Diabetes Father   . Heart disease Father   . Cancer Father        prostate  . Cancer Brother        Eye    SH:  Married, non smoker  Review of Systems  Constitutional: Negative.   HENT: Negative.   Eyes: Negative.   Respiratory: Negative.   Cardiovascular: Negative.   Gastrointestinal: Negative.   Endocrine: Negative.   Genitourinary: Positive for difficulty urinating and urgency.  Musculoskeletal: Negative.   Skin: Negative.   Allergic/Immunologic: Negative.   Neurological: Negative.   Hematological: Negative.   Psychiatric/Behavioral: Negative.     PHYSICAL EXAMINATION:    BP 124/70 (BP Location: Right Arm, Patient Position: Sitting, Cuff Size: Normal)   Pulse 70   Temp (!) 97.3 F (36.3 C) (Temporal)   Resp 14   Ht 5\' 6"  (1.676 m)   Wt 154 lb 6.4 oz (70 kg)   BMI 24.92 kg/m     General appearance: alert, cooperative and appears stated age  Assessment: Urinary urgency Recurrent UTI  Plan: Urine culture pending.  As [place], rx for macrobid 100mg  bid x 7 days given.  She is going to wait and see if symptoms worsen before taking it.  If they improve, she will wait for the culture results.

## 2019-02-23 LAB — URINALYSIS, MICROSCOPIC ONLY
Bacteria, UA: NONE SEEN
Casts: NONE SEEN /lpf
Epithelial Cells (non renal): NONE SEEN /hpf (ref 0–10)
RBC, Urine: NONE SEEN /hpf (ref 0–2)
WBC, UA: NONE SEEN /hpf (ref 0–5)

## 2019-02-24 LAB — URINE CULTURE

## 2019-02-27 ENCOUNTER — Ambulatory Visit: Payer: Medicare Other

## 2019-02-28 ENCOUNTER — Telehealth: Payer: Self-pay | Admitting: Obstetrics & Gynecology

## 2019-02-28 NOTE — Telephone Encounter (Signed)
Call to patient. Patient states she will take her last tablet of the macrobid tonight. States she symptoms resolved and feels "fine- hallelujah!" Patient asking when she should start trimethoprim? RN advised per review of OV note patient to transition to suppressive therapy once symptoms resolved. Patient states that's what she thought, but didn't confirm at last appointment. RN would review and confirm with Dr. Sabra Heck and return call with recommendations. Patient aware Dr. Sabra Heck is out of the office today so response may not be immediate. Patient agreeable.   Routing to provider for review.

## 2019-02-28 NOTE — Telephone Encounter (Signed)
Patient is calling regarding questions on when to start taking trimethoprim 100MG .

## 2019-03-01 NOTE — Telephone Encounter (Signed)
Yes, she's going to start Trimethoprim for suppressive therapy at this time.  She does have urology appt scheduled for early November and I do want her to keep this appt.

## 2019-03-01 NOTE — Telephone Encounter (Signed)
Spoke with patient, advised per Dr. Miller. Patient verbalizes understanding and is agreeable. Encounter closed.  

## 2019-03-12 ENCOUNTER — Telehealth: Payer: Self-pay | Admitting: Obstetrics & Gynecology

## 2019-03-12 NOTE — Telephone Encounter (Signed)
Patient is currently taking antibiotic and states she is having some side effects. She is experiencing fatigue, states her arms and legs are weak and she is "very, very tired". She is also having a lot of gas, which she states is not normal for her.

## 2019-03-12 NOTE — Telephone Encounter (Signed)
Spoke with patient, advised per Dr. Sabra Heck. Patient is agreeable to plan, will provider update in a few days.   Routing to provider for final review. Patient is agreeable to disposition. Will close encounter.

## 2019-03-12 NOTE — Telephone Encounter (Signed)
Spoke with patient. Completed Macrobid for UTI on 10/22, switched to trimethoprim 100 mg PO daily for UTI prevention.   Patient reports on 10/27 she started experiencing increased fatigue, weakness in arms and legs.  Increased flatus and abdominal pain only in the mornings, BMD daily, reports as normal. Denies N/V, upper respiratory symptoms, fever/chills or urinary symptoms.   Last took trimethoprim on 11/1. Patient asking if ok to stop abx and see if symptoms resolve? Or additional recommendations? Advised I will review with Dr. Sabra Heck and return call, patient agreeable.   Dr. Sabra Heck -please advise.

## 2019-03-12 NOTE — Telephone Encounter (Signed)
Yes, I think it is ok to stop the trimethoprim to see if the symptoms go away.  I don't want to add another antibiotic yet.  Please ask if she will give and update in a few days.  Thanks.

## 2019-03-13 DIAGNOSIS — H04123 Dry eye syndrome of bilateral lacrimal glands: Secondary | ICD-10-CM | POA: Diagnosis not present

## 2019-03-13 DIAGNOSIS — Z961 Presence of intraocular lens: Secondary | ICD-10-CM | POA: Diagnosis not present

## 2019-03-13 DIAGNOSIS — H35362 Drusen (degenerative) of macula, left eye: Secondary | ICD-10-CM | POA: Diagnosis not present

## 2019-03-13 DIAGNOSIS — H40013 Open angle with borderline findings, low risk, bilateral: Secondary | ICD-10-CM | POA: Diagnosis not present

## 2019-03-19 ENCOUNTER — Telehealth: Payer: Self-pay | Admitting: Obstetrics & Gynecology

## 2019-03-19 DIAGNOSIS — N952 Postmenopausal atrophic vaginitis: Secondary | ICD-10-CM | POA: Diagnosis not present

## 2019-03-19 DIAGNOSIS — N3000 Acute cystitis without hematuria: Secondary | ICD-10-CM | POA: Diagnosis not present

## 2019-03-19 DIAGNOSIS — R3914 Feeling of incomplete bladder emptying: Secondary | ICD-10-CM | POA: Diagnosis not present

## 2019-03-19 NOTE — Telephone Encounter (Signed)
Patient advised to stop trimethoprim on 03/12/19 due to side effects.   Routing to Dr. Sabra Heck.

## 2019-03-19 NOTE — Telephone Encounter (Signed)
Patient stated that she stopped Trimethoprim on November 2 and was told to call back with an update on how she was feeling. Patient stated that she was feeling better by November 4 and had her strength back.   Cc: Dr. Sabra Heck for [facility]

## 2019-03-20 NOTE — Telephone Encounter (Signed)
Spoke with patient. Patient states she did see urology on 03/19/19. Bladder US completed, no stones, UA was clear, waiting for UC. States she was advised to start a probiotic, restart estradiol cream and return if symptoms reoccur. No abx prescribed.   Patient has RX for estradiol on file. Advised patient since she was seen by urology, will hold off on sending on abx, follow recommendations provided by urology. Advised I will update Dr. Sabra Heck and return call if any additional recommendations. Patient verbalizes understanding and is agreeable.   Routing to provider for final review. Patient is agreeable to disposition. Will close encounter.

## 2019-03-20 NOTE — Telephone Encounter (Signed)
Ok to start macrobid 100mg  daily to prevent UTI if she is ok with this.  I think she has urology appt as well.  Please confirm.  It is ok to wait for this appt if it's not too far away from now as well.  Thanks.

## 2019-04-27 ENCOUNTER — Other Ambulatory Visit: Payer: Self-pay

## 2019-05-01 ENCOUNTER — Encounter: Payer: Self-pay | Admitting: Obstetrics & Gynecology

## 2019-05-01 ENCOUNTER — Ambulatory Visit (INDEPENDENT_AMBULATORY_CARE_PROVIDER_SITE_OTHER): Payer: Medicare Other | Admitting: Obstetrics & Gynecology

## 2019-05-01 ENCOUNTER — Other Ambulatory Visit: Payer: Self-pay

## 2019-05-01 VITALS — BP 122/74 | HR 84 | Temp 97.2°F | Resp 12 | Ht 65.25 in | Wt 151.6 lb

## 2019-05-01 DIAGNOSIS — Z124 Encounter for screening for malignant neoplasm of cervix: Secondary | ICD-10-CM

## 2019-05-01 DIAGNOSIS — Z01419 Encounter for gynecological examination (general) (routine) without abnormal findings: Secondary | ICD-10-CM

## 2019-05-01 NOTE — Progress Notes (Signed)
73 y.o. G2P2 Married White or Caucasian female here for annual exam.  Doing well.  Did see Dr. Dorina Hoyer.  He did recommend using the estradiol cream.  Currently, not having any urinary symptoms.  Denies vaginal bleeding.  Denies hematuria.  She did take a probiotic for a few weeks after seeing him.  Having hand surgery in January.    PCP:  Dr. Harrington Challenger.      No LMP recorded. Patient has had a hysterectomy.          Sexually active: Yes.    The current method of family planning is status post hysterectomy.    Exercising: Yes.    stationary bike, treadmill, weights Smoker:  no  Health Maintenance: Pap:  11/19/09 WNL History of abnormal Pap:  no MMG:  12/22/18 BIRADS 1 negative/density b Colonoscopy:  10/17 with Dr. Watt Climes.  Follow up 10 years. BMD:   12/22/18 Osteopenia TDaP:  UTD per patient   Pneumonia vaccine(s):  Prevnar done about 2 years ago per patient Shingrix:   Zostavax has been completed Hep C testing: 10/07/15 Negative Screening Labs: PCP   reports that she has never smoked. She has never used smokeless tobacco. She reports current alcohol use of about 1.0 standard drinks of alcohol per week. She reports that she does not use drugs.  Past Medical History:  Diagnosis Date  . Contact lens/glasses fitting    wears one contact lt eye  . Heterozygous factor V Leiden mutation ([place])   . Osteopenia   . PONV (postoperative nausea and vomiting)   . Superficial vein thrombosis 1997  . Varicose veins of both lower extremities     Past Surgical History:  Procedure Laterality Date  . BARTHOLIN CYST MARSUPIALIZATION    . BARTHOLIN CYST MARSUPIALIZATION Right 08/19/2014   Procedure: BARTHOLIN CYST MARSUPIALIZATION;  Surgeon: Megan Salon, MD;  Location: [facility];  Service: Gynecology;  Laterality: Right;  . CARPAL TUNNEL RELEASE Right 12/20/2012   Procedure: CARPAL TUNNEL RELEASE;  Surgeon: Wynonia Sours, MD;  Location: [facility];  Service: Orthopedics;  Laterality: Right;  .  CATARACT EXTRACTION Right 2009      . [place]   x2  . EAR CYST EXCISION Left 08/15/2014   Procedure: LEFT THUMB DEBRIDEMENT INTERPHALANGEAL JOINT LEFT THUMB REMOVAL MUCOID CYST ;  Surgeon: Daryll Brod, MD;  Location: [facility];  Service: Orthopedics;  Laterality: Left;  . FINGER SURGERY  2002   lt long finger mass  . GANGLION CYST EXCISION Left 2/02  . TOTAL ABDOMINAL HYSTERECTOMY W/ BILATERAL SALPINGOOPHORECTOMY  1997  . TUBAL LIGATION  1977    Current Outpatient Medications  Medication Sig Dispense Refill  . acetaminophen (TYLENOL) 500 MG tablet Take 500 mg by mouth every 4 (four) hours as needed for mild pain.    . calcium carbonate (OS-CAL) 600 MG TABS tablet Take 600 mg by mouth 2 (two) times daily with a meal.    . calcium carbonate (TUMS - DOSED IN MG ELEMENTAL CALCIUM) 500 MG chewable tablet Chew 1 tablet by mouth as needed for indigestion or heartburn.    . fish oil-omega-3 fatty acids 1000 MG capsule Take 2 g by mouth daily.    . Flaxseed, Linseed, (FLAX SEED OIL PO) Take 1 capsule by mouth daily.     Marland Kitchen ibuprofen (ADVIL,MOTRIN) 200 MG tablet Take 200 mg by mouth every 6 (six) hours as needed for moderate pain.    . Multiple Vitamins-Minerals (MULTIVITAMIN  WITH MINERALS) tablet Take 1 tablet by mouth daily.    . naproxen sodium (ANAPROX) 220 MG tablet Take 220 mg by mouth daily.     . NONFORMULARY OR COMPOUNDED ITEM Estradiol cream 0.02% in 59ml prefilled syringes.  32ml pv twice weekly.  #58month supply. 1 each 3  . Plant Sterols and Stanols (CHOLESTOFF PO) Take by mouth daily.     . vitamin C (ASCORBIC ACID) 500 MG tablet Take 500 mg by mouth daily.     No current facility-administered medications for this visit.    Family History  Problem Relation Age of Onset  . Heart disease Mother   . Hyperlipidemia Mother   . Thyroid disease Mother   . Osteoporosis Mother   . Dementia Mother   . Breast cancer Mother 32  . Diabetes Father   . Heart  disease Father   . Cancer Father        prostate  . Cancer Brother        Eye    Review of Systems  All other systems reviewed and are negative.   Exam:   BP 122/74 (BP Location: Left Arm, Patient Position: Sitting, Cuff Size: Normal)   Pulse 84   Temp (!) 97.2 F (36.2 C) (Temporal)   Resp 12   Ht 5' 5.25" (1.657 m)   Wt 151 lb 9.6 oz (68.8 kg)   BMI 25.03 kg/m   Height: 5' 5.25" (165.7 cm)  Ht Readings from Last 3 Encounters:  05/01/19 5' 5.25" (1.657 m)  02/22/19 5\' 6"  (1.676 m)  02/13/19 5\' 6"  (1.676 m)   General appearance: alert, cooperative and appears stated age Head: Normocephalic, without obvious abnormality, atraumatic Neck: no adenopathy, supple, symmetrical, trachea midline and thyroid normal to inspection and palpation Lungs: clear to auscultation bilaterally Breasts: normal appearance, no masses or tenderness Heart: regular rate and rhythm Abdomen: soft, non-tender; bowel sounds normal; no masses,  no organomegaly Extremities: extremities normal, atraumatic, no cyanosis or edema Skin: Skin color, texture, turgor normal. No rashes or lesions Lymph nodes: Cervical, supraclavicular, and axillary nodes normal. No abnormal inguinal nodes palpated Neurologic: Grossly normal  Pelvic: External genitalia:  no lesions              Urethra:  normal appearing urethra with no masses, tenderness or lesions              Bartholins and Skenes: normal                 Vagina: normal appearing vagina with normal color and discharge, no lesions              Cervix: absent              Pap taken: No. Bimanual Exam:  Uterus:  uterus absent              Adnexa: no mass, fullness, tenderness               Rectovaginal: Confirms               Anus:  normal sphincter tone, no lesions  Chaperone, Terence Lux, CMA, was present for exam.  A:  Well Woman with normal exam MP, no HRT H/o factor V Leiden heterozygous H/O TAH/BSO (due to bleeding) Recurrent UTIs, saw urology  earlier this year  P:   Mammogram guidelines reviewed pap smear not indicated BMD is UTD Colonoscopy is UTD Blood work done with Dr. Harrington Challenger Return annually or prn

## 2019-05-14 ENCOUNTER — Other Ambulatory Visit: Payer: Self-pay | Admitting: Orthopedic Surgery

## 2019-05-22 ENCOUNTER — Encounter (HOSPITAL_BASED_OUTPATIENT_CLINIC_OR_DEPARTMENT_OTHER): Payer: Self-pay | Admitting: Orthopedic Surgery

## 2019-05-22 ENCOUNTER — Other Ambulatory Visit: Payer: Self-pay

## 2019-05-24 DIAGNOSIS — L821 Other seborrheic keratosis: Secondary | ICD-10-CM | POA: Diagnosis not present

## 2019-05-24 DIAGNOSIS — D225 Melanocytic nevi of trunk: Secondary | ICD-10-CM | POA: Diagnosis not present

## 2019-05-24 DIAGNOSIS — L814 Other melanin hyperpigmentation: Secondary | ICD-10-CM | POA: Diagnosis not present

## 2019-05-24 DIAGNOSIS — L578 Other skin changes due to chronic exposure to nonionizing radiation: Secondary | ICD-10-CM | POA: Diagnosis not present

## 2019-05-24 DIAGNOSIS — L309 Dermatitis, unspecified: Secondary | ICD-10-CM | POA: Diagnosis not present

## 2019-05-24 DIAGNOSIS — Z23 Encounter for immunization: Secondary | ICD-10-CM | POA: Diagnosis not present

## 2019-05-25 ENCOUNTER — Other Ambulatory Visit (HOSPITAL_COMMUNITY)
Admission: RE | Admit: 2019-05-25 | Discharge: 2019-05-25 | Disposition: A | Discharge status: Home / Self Care | Payer: Medicare Other | Source: Ambulatory Visit | Attending: Orthopedic Surgery | Admitting: Orthopedic Surgery

## 2019-05-25 DIAGNOSIS — Z20822 Contact with and (suspected) exposure to covid-19: Secondary | ICD-10-CM | POA: Diagnosis not present

## 2019-05-25 DIAGNOSIS — Z01812 Encounter for preprocedural laboratory examination: Secondary | ICD-10-CM | POA: Diagnosis not present

## 2019-05-25 NOTE — Progress Notes (Signed)

## 2019-05-26 LAB — NOVEL CORONAVIRUS, NAA (HOSP ORDER, SEND-OUT TO REF LAB; TAT 18-24 HRS): SARS-CoV-2, NAA: NOT DETECTED

## 2019-05-28 NOTE — Anesthesia Preprocedure Evaluation (Addendum)
Anesthesia Evaluation  Patient identified by MRN, date of birth, ID band Patient awake    Reviewed: Allergy & Precautions, NPO status , Patient's Chart, lab work & pertinent test results  History of Anesthesia Complications (+) PONV and history of anesthetic complications  Airway Mallampati: II  TM Distance: >3 FB Neck ROM: Full    Dental no notable dental hx.    Pulmonary neg pulmonary ROS,    Pulmonary exam normal        Cardiovascular negative cardio ROS Normal cardiovascular exam     Neuro/Psych negative neurological ROS  negative psych ROS   GI/Hepatic negative GI ROS, Neg liver ROS,   Endo/Other  negative endocrine ROS  Renal/GU negative Renal ROS  negative genitourinary   Musculoskeletal  (+) Arthritis ,   Abdominal   Peds  Hematology negative hematology ROS (+)   Anesthesia Other Findings Day of surgery medications reviewed with patient.  Reproductive/Obstetrics negative OB ROS                            Anesthesia Physical Anesthesia Plan  ASA: II  Anesthesia Plan: Regional and MAC   Post-op Pain Management:    Induction:   PONV Risk Score and Plan: 3 and Ondansetron, Treatment may vary due to age or medical condition, Midazolam and Propofol infusion  Airway Management Planned: Natural Airway and Simple Face Mask  Additional Equipment: None  Intra-op Plan:   Post-operative Plan:   Informed Consent: I have reviewed the patients History and Physical, chart, labs and discussed the procedure including the risks, benefits and alternatives for the proposed anesthesia with the patient or authorized representative who has indicated his/her understanding and acceptance.     Dental advisory given  Plan Discussed with: CRNA  Anesthesia Plan Comments:        Anesthesia Quick Evaluation

## 2019-05-29 ENCOUNTER — Encounter (HOSPITAL_BASED_OUTPATIENT_CLINIC_OR_DEPARTMENT_OTHER): Payer: Self-pay | Admitting: Orthopedic Surgery

## 2019-05-29 ENCOUNTER — Ambulatory Visit (HOSPITAL_BASED_OUTPATIENT_CLINIC_OR_DEPARTMENT_OTHER): Payer: Medicare Other | Admitting: Anesthesiology

## 2019-05-29 ENCOUNTER — Other Ambulatory Visit: Payer: Self-pay

## 2019-05-29 ENCOUNTER — Encounter (HOSPITAL_BASED_OUTPATIENT_CLINIC_OR_DEPARTMENT_OTHER)
Admission: RE | Disposition: A | Discharge status: Home / Self Care | Payer: Self-pay | Source: Home / Self Care | Attending: Orthopedic Surgery

## 2019-05-29 ENCOUNTER — Ambulatory Visit (HOSPITAL_BASED_OUTPATIENT_CLINIC_OR_DEPARTMENT_OTHER)
Admission: RE | Admit: 2019-05-29 | Discharge: 2019-05-29 | Disposition: A | Discharge status: Home / Self Care | Payer: Medicare Other | Attending: Orthopedic Surgery | Admitting: Orthopedic Surgery

## 2019-05-29 DIAGNOSIS — M189 Osteoarthritis of first carpometacarpal joint, unspecified: Secondary | ICD-10-CM | POA: Insufficient documentation

## 2019-05-29 DIAGNOSIS — M1811 Unilateral primary osteoarthritis of first carpometacarpal joint, right hand: Secondary | ICD-10-CM | POA: Diagnosis not present

## 2019-05-29 DIAGNOSIS — M8580 Other specified disorders of bone density and structure, unspecified site: Secondary | ICD-10-CM | POA: Diagnosis not present

## 2019-05-29 DIAGNOSIS — J45909 Unspecified asthma, uncomplicated: Secondary | ICD-10-CM | POA: Diagnosis not present

## 2019-05-29 HISTORY — PX: CARPOMETACARPEL SUSPENSION PLASTY: SHX5005

## 2019-05-29 SURGERY — CARPOMETACARPEL (CMC) SUSPENSION PLASTY
Anesthesia: Monitor Anesthesia Care | Site: Thumb | Laterality: Right

## 2019-05-29 MED ORDER — PROMETHAZINE HCL 25 MG/ML IJ SOLN: 6.2500 mg | INTRAMUSCULAR | Status: DC | PRN

## 2019-05-29 MED ORDER — LACTATED RINGERS IV SOLN: INTRAVENOUS | Status: DC

## 2019-05-29 MED ORDER — PROPOFOL 500 MG/50ML IV EMUL
INTRAVENOUS | Status: DC | PRN
  Administered 2019-05-29: 20 ug/kg/min via INTRAVENOUS
  Administered 2019-05-29: 75 ug/kg/min via INTRAVENOUS

## 2019-05-29 MED ORDER — ACETAMINOPHEN 500 MG PO TABS
1000.0000 mg | ORAL_TABLET | Freq: Once | ORAL | Status: AC
  Administered 2019-05-29: 1000 mg via ORAL

## 2019-05-29 MED ORDER — PROPOFOL 10 MG/ML IV BOLUS
INTRAVENOUS | Status: AC
  Filled 2019-05-29: qty 20

## 2019-05-29 MED ORDER — ACETAMINOPHEN 500 MG PO TABS
ORAL_TABLET | ORAL | Status: AC
  Filled 2019-05-29: qty 2

## 2019-05-29 MED ORDER — BUPIVACAINE-EPINEPHRINE (PF) 0.5% -1:200000 IJ SOLN
INTRAMUSCULAR | Status: DC | PRN
  Administered 2019-05-29: 30 mL via PERINEURAL

## 2019-05-29 MED ORDER — BUPIVACAINE HCL (PF) 0.25 % IJ SOLN
INTRAMUSCULAR | Status: AC
  Filled 2019-05-29: qty 30

## 2019-05-29 MED ORDER — MIDAZOLAM HCL 2 MG/2ML IJ SOLN
INTRAMUSCULAR | Status: AC
  Filled 2019-05-29: qty 2

## 2019-05-29 MED ORDER — MIDAZOLAM HCL 2 MG/2ML IJ SOLN: 1.0000 mg | INTRAMUSCULAR | Status: DC | PRN

## 2019-05-29 MED ORDER — FENTANYL CITRATE (PF) 100 MCG/2ML IJ SOLN
INTRAMUSCULAR | Status: AC
  Filled 2019-05-29: qty 2

## 2019-05-29 MED ORDER — FENTANYL CITRATE (PF) 100 MCG/2ML IJ SOLN: 25.0000 ug | INTRAMUSCULAR | Status: DC | PRN

## 2019-05-29 MED ORDER — OXYCODONE HCL 5 MG PO TABS: 5.0000 mg | ORAL_TABLET | Freq: Once | ORAL | Status: DC | PRN

## 2019-05-29 MED ORDER — ONDANSETRON HCL 4 MG/2ML IJ SOLN
INTRAMUSCULAR | Status: AC
  Filled 2019-05-29: qty 2

## 2019-05-29 MED ORDER — ONDANSETRON HCL 4 MG/2ML IJ SOLN
INTRAMUSCULAR | Status: DC | PRN
  Administered 2019-05-29: 4 mg via INTRAVENOUS

## 2019-05-29 MED ORDER — CEFAZOLIN SODIUM-DEXTROSE 2-4 GM/100ML-% IV SOLN
INTRAVENOUS | Status: AC
  Filled 2019-05-29: qty 100

## 2019-05-29 MED ORDER — CHLORHEXIDINE GLUCONATE 4 % EX LIQD: 60.0000 mL | Freq: Once | CUTANEOUS | Status: DC

## 2019-05-29 MED ORDER — CEFAZOLIN SODIUM-DEXTROSE 2-4 GM/100ML-% IV SOLN
2.0000 g | INTRAVENOUS | Status: AC
  Administered 2019-05-29: 2 g via INTRAVENOUS

## 2019-05-29 MED ORDER — OXYCODONE-ACETAMINOPHEN 5-325 MG PO TABS: 1.0000 | ORAL_TABLET | ORAL | 0 refills | Status: AC | PRN

## 2019-05-29 MED ORDER — OXYCODONE HCL 5 MG/5ML PO SOLN: 5.0000 mg | Freq: Once | ORAL | Status: DC | PRN

## 2019-05-29 MED ORDER — FENTANYL CITRATE (PF) 100 MCG/2ML IJ SOLN
50.0000 ug | INTRAMUSCULAR | Status: DC | PRN
  Administered 2019-05-29: 50 ug via INTRAVENOUS

## 2019-05-29 SURGICAL SUPPLY — 58 items
BIT DRILL PASSING CMC 1/4 FLEX (BIT) IMPLANT
BLADE MINI RND TIP GREEN BEAV (BLADE) ×3 IMPLANT
BLADE SURG 15 STRL LF DISP TIS (BLADE) ×1 IMPLANT
BLADE SURG 15 STRL SS (BLADE) ×2
BNDG COHESIVE 3X5 TAN STRL LF (GAUZE/BANDAGES/DRESSINGS) ×3 IMPLANT
BNDG ESMARK 4X9 LF (GAUZE/BANDAGES/DRESSINGS) ×3 IMPLANT
BNDG GAUZE ELAST 4 BULKY (GAUZE/BANDAGES/DRESSINGS) ×3 IMPLANT
BUTTON ALL-SUT W/BACKSTOP (Orthopedic Implant) ×2 IMPLANT
CHLORAPREP W/TINT 26 (MISCELLANEOUS) ×3 IMPLANT
CORD BIPOLAR FORCEPS 12FT (ELECTRODE) ×3 IMPLANT
COVER BACK TABLE 60X90IN (DRAPES) ×3 IMPLANT
COVER MAYO STAND STRL (DRAPES) ×3 IMPLANT
COVER WAND RF STERILE (DRAPES) IMPLANT
CUFF TOURN SGL QUICK 18X4 (TOURNIQUET CUFF) ×2 IMPLANT
DECANTER SPIKE VIAL GLASS SM (MISCELLANEOUS) IMPLANT
DRAPE EXTREMITY T 121X128X90 (DISPOSABLE) ×3 IMPLANT
DRAPE OEC MINIVIEW 54X84 (DRAPES) ×3 IMPLANT
DRAPE SURG 17X23 STRL (DRAPES) ×3 IMPLANT
DRILL PASSING CMC 1/4 FLEX (BIT) ×3
GAUZE 4X4 16PLY RFD (DISPOSABLE) IMPLANT
GAUZE SPONGE 4X4 12PLY STRL (GAUZE/BANDAGES/DRESSINGS) ×3 IMPLANT
GAUZE XEROFORM 1X8 LF (GAUZE/BANDAGES/DRESSINGS) ×3 IMPLANT
GLOVE BIO SURGEON STRL SZ 6.5 (GLOVE) ×2 IMPLANT
GLOVE BIO SURGEONS STRL SZ 6.5 (GLOVE) ×2
GLOVE BIOGEL M STRL SZ7.5 (GLOVE) ×2 IMPLANT
GLOVE BIOGEL PI IND STRL 7.0 (GLOVE) IMPLANT
GLOVE BIOGEL PI IND STRL 8.5 (GLOVE) ×1 IMPLANT
GLOVE BIOGEL PI INDICATOR 7.0 (GLOVE) ×4
GLOVE BIOGEL PI INDICATOR 8.5 (GLOVE) ×2
GLOVE SURG ORTHO 8.0 STRL STRW (GLOVE) ×3 IMPLANT
GOWN STRL REUS W/ TWL LRG LVL3 (GOWN DISPOSABLE) ×1 IMPLANT
GOWN STRL REUS W/TWL LRG LVL3 (GOWN DISPOSABLE) ×4
GOWN STRL REUS W/TWL XL LVL3 (GOWN DISPOSABLE) ×5 IMPLANT
NDL PRECISIONGLIDE 27X1.5 (NEEDLE) IMPLANT
NEEDLE PRECISIONGLIDE 27X1.5 (NEEDLE) IMPLANT
NS IRRIG 1000ML POUR BTL (IV SOLUTION) ×3 IMPLANT
PACK BASIN DAY SURGERY FS (CUSTOM PROCEDURE TRAY) ×3 IMPLANT
PAD CAST 3X4 CTTN HI CHSV (CAST SUPPLIES) ×1 IMPLANT
PADDING CAST ABS 3INX4YD NS (CAST SUPPLIES)
PADDING CAST ABS COTTON 3X4 (CAST SUPPLIES) IMPLANT
PADDING CAST COTTON 3X4 STRL (CAST SUPPLIES) ×2
SLEEVE SCD COMPRESS KNEE MED (MISCELLANEOUS) ×3 IMPLANT
SPLINT PLASTER CAST XFAST 3X15 (CAST SUPPLIES) IMPLANT
SPLINT PLASTER XTRA FASTSET 3X (CAST SUPPLIES) ×20
STOCKINETTE 4X48 STRL (DRAPES) ×3 IMPLANT
SUT ETHIBOND 3-0 V-5 (SUTURE) ×3 IMPLANT
SUT ETHILON 4 0 PS 2 18 (SUTURE) ×3 IMPLANT
SUT FIBERWIRE 4-0 18 DIAM BLUE (SUTURE)
SUT MERSILENE 4 0 P 3 (SUTURE) IMPLANT
SUT STEEL 3 0 (SUTURE) ×3 IMPLANT
SUT VIC AB 4-0 P-3 18XBRD (SUTURE) IMPLANT
SUT VIC AB 4-0 P2 18 (SUTURE) IMPLANT
SUT VIC AB 4-0 P3 18 (SUTURE)
SUTURE FIBERWR 4-0 18 DIA BLUE (SUTURE) IMPLANT
SYR BULB 3OZ (MISCELLANEOUS) ×3 IMPLANT
SYR CONTROL 10ML LL (SYRINGE) IMPLANT
TOWEL GREEN STERILE FF (TOWEL DISPOSABLE) ×6 IMPLANT
UNDERPAD 30X36 HEAVY ABSORB (UNDERPADS AND DIAPERS) ×3 IMPLANT

## 2019-05-29 NOTE — Op Note (Signed)
NAME: [name] MEDICAL RECORD NO: [id number] DATE OF BIRTH: 1945/10/02 FACILITY: [facility] LOCATION: [facility] PHYSICIAN: Wynonia Sours, MD   OPERATIVE REPORT   DATE OF PROCEDURE: 05/29/19    PREOPERATIVE DIAGNOSIS:   CMC arthritis right thumb   POSTOPERATIVE DIAGNOSIS:   Same   PROCEDURE:   Excision trapezium and FCR transfer with micro link suspension right thumb   SURGEON: Daryll Brod, M.D.   ASSISTANT: Leverne Humbles, [facility]   ANESTHESIA:  Regional with sedation   INTRAVENOUS FLUIDS:  Per anesthesia flow sheet.   ESTIMATED BLOOD LOSS:  Minimal.   COMPLICATIONS:  None.   SPECIMENS:  none   TOURNIQUET TIME:    Total Tourniquet Time Documented: Forearm (Right) - 57 minutes Total: Forearm (Right) - 57 minutes    DISPOSITION:  Stable to PACU.   INDICATIONS: Patient is a 74 year old female with a history of arthritis carpometacarpal joint of her right thumb nonresponsive to conservative treatment including multiple injections. She has elected undergo arthroplasty of the carpometacarpal joint right thumb with micro link suspension supplementation. Preperi-and postoperative course been discussed along with risks and complications. She is aware that there is no guarantee to the surgery the possibility of infection recurrence injury to arteries nerves tendons complete relief symptoms and dystrophy. In the preoperative area the patient is seen extremity marked by both patient and surgeon antibiotic given. A supraclavicular block was carried out without difficulty under the direction the anesthesia department in the preoperative area.  OPERATIVE COURSE: Patient brought to the operating room placed in the supine position with the right arm free. She was prepped and draped using ChloraPrep a 3-minute dry time was allowed and a timeout taken to confirm patient procedure. The limb was exsanguinated with an Esmarch bandage tourniquet placed on the arm was inflated to 250  mmHg. A longitudinal incision was made over the interval between the abductor pollicis longus extensor pollicis brevis tendon carried down through subcutaneous tissue. Neurovascular structures were identified protected. The interval between the APL EPB was then incised at the level of the carpometacarpal joint prior brought proximally after isolation and identification of the radial artery. The periosteum was then elevated off from the trapezium which was then removed in a piecemeal technique. The trapezial ridge was removed as much as possible. One half of the FCR tendon was then harvested by placing retractors distally and flexing the wrist this brought a significant portion of the flexor carpi radialis tendon into view. The separated in half and one half incised proximally. The tendon was then stripped separated distally to the level of its insertion of the second metacarpal. The wound was irrigated. A micro link guide was then placed through the base of the thumb metacarpal after isolation just dorsal to the abductor pollicis longus insertion. This was placed through the base of the first and then through the base of the second metacarpal. X-rays confirm positioning AP lateral oblique directions. An incision was then made over the dorsal aspect of the interval between the index middle metacarpals and the guidewire was identified and dissected down to bone. The micro link suture was then passed through. Incision was made over the exit of the micro link guide pin in the interval between the index metacarpal webspace. The 2 anchors were then positioned a right angle hemostat was placed between the base of the index and thumb metacarpals and the micro link suture tied. There was no proximal migration possible with stressing of the carpometacarpal joint with  full mobility. The flexor carpi radialis tendon was then woven through the insertion of the abductor pollicis longus and sutured into position with multiple 3-0  Ethibond sutures. This was then brought back into the defect created by excision of the trapezium and sutured to itself with the 3-0 Ethibond. Wounds were irrigated. X-rays confirm positioning of the thumb with good suspension. The capsule was closed over the trapezial excision with interrupted 4-0 Vicryl sutures. The skin was then closed with interrupted 4-0 nylon sutures on both the incision on the dorsal aspect of the second metacarpal space. Deflation of the tourniquet all fingers pink. A Compro compressive dressing dorsal palmar thumb spica splint was applied. The patient was taken to the recovery room for observation in satisfactory condition. Patient tolerated the procedure well she'll be discharged home to return to [facility] in 1 week Tylenol ibuprofen for pain with Percocet as a backup for breakthrough.  Daryll Brod, MD Electronically signed, 05/29/19

## 2019-05-29 NOTE — Anesthesia Procedure Notes (Addendum)
Anesthesia Regional Block: Supraclavicular block   Pre-Anesthetic Checklist: ,, timeout performed, Correct Patient, Correct Site, Correct Laterality, Correct Procedure, Correct Position, site marked, Risks and benefits discussed, pre-op evaluation,  At surgeon's request and post-op pain management  Laterality: Right  Prep: Maximum Sterile Barrier Precautions used, chloraprep       Needles:  Injection technique: Single-shot  Needle Type: Echogenic Stimulator Needle     Needle Length: 9cm  Needle Gauge: 22     Additional Needles:   Procedures:,,,, ultrasound used (permanent image in chart),,,,  Narrative:  Start time: 05/29/2019 7:56 AM End time: 05/29/2019 7:59 AM Injection made incrementally with aspirations every 5 mL.  Performed by: Personally  Anesthesiologist: Brennan Bailey, MD  Additional Notes: Risks, benefits, and alternative discussed. Patient gave consent for procedure. Patient prepped and draped in sterile fashion. Sedation administered, patient remains easily responsive to voice. Relevant anatomy identified with ultrasound guidance. Local anesthetic given in 5cc increments with no signs or symptoms of intravascular injection. No pain or paraesthesias with injection. Patient monitored throughout procedure with signs of LAST or immediate complications. Tolerated well. Ultrasound image placed in chart.  Tawny Asal, MD

## 2019-05-29 NOTE — Anesthesia Postprocedure Evaluation (Signed)
Anesthesia Post Note  Patient: [name]  Procedure(s) Performed: CARPOMETACARPEL ([place]) SUSPENSION PLASTY excision trapexzium tendon transfer, microlink suspection plasty (Right Thumb)     Patient location during evaluation: PACU Anesthesia Type: Regional Level of consciousness: awake and alert and oriented Pain management: pain level controlled Vital Signs Assessment: post-procedure vital signs reviewed and stable Respiratory status: spontaneous breathing, nonlabored ventilation and respiratory function stable Cardiovascular status: blood pressure returned to baseline Postop Assessment: no apparent nausea or vomiting Anesthetic complications: no    Last Vitals:  Vitals:   05/29/19 1000 05/29/19 1015  BP: 108/65 112/66  Pulse: 75 64  Resp: 13 11  Temp:    SpO2: 100% 96%    Last Pain:  Vitals:   05/29/19 1015  TempSrc:   PainSc: 0-No pain                 Brennan Bailey

## 2019-05-29 NOTE — H&P (Signed)
[name] is an 74 y.o. female.   Chief Complaint: right thumb pain HPI: [name] is a 74 yo female complaining of pain in the basilar area of her right thumb. This been going several years. I told her gripping up clapping increase her pain for her. Gripping increases it. She has no new history of injury. She complains of a sharp pain localized to the basilar joint of her right thumb to a lesser extent on the left side. This is VAS score 9/10 when it occurs. She has been taking Aleve now and ibuprofen occasional Tylenol with minimal relief. She does have splints to wear. She has not been wearing them. She has had 2 injections in the past.  She states that her pain is virtually constant at the present time. Moderate to severe in nature with pinching and gripping. Is localized at the basilar joint of her thumb. Is status post carpal tunnel release on that side.  No history of diabetes thyroid problems or gout. She does have history of arthritis. Family history is positive diabetes negative for thyroid problems arthritis and gout. She has had splints anti-inflammatories which are no longer working satisfactorily for her.  Past Medical History:  Diagnosis Date  . Contact lens/glasses fitting    wears one contact lt eye  . Heterozygous factor V Leiden mutation ([place])   . Osteopenia   . PONV (postoperative nausea and vomiting)   . Superficial vein thrombosis 1997  . Varicose veins of both lower extremities     Past Surgical History:  Procedure Laterality Date  . BARTHOLIN CYST MARSUPIALIZATION    . BARTHOLIN CYST MARSUPIALIZATION Right 08/19/2014   Procedure: BARTHOLIN CYST MARSUPIALIZATION;  Surgeon: Megan Salon, MD;  Location: [facility];  Service: Gynecology;  Laterality: Right;  . CARPAL TUNNEL RELEASE Right 12/20/2012   Procedure: CARPAL TUNNEL RELEASE;  Surgeon: Wynonia Sours, MD;  Location: [facility];  Service: Orthopedics;  Laterality: Right;  . CATARACT EXTRACTION Right 2009    . [place]   x2  . EAR CYST EXCISION Left 08/15/2014   Procedure: LEFT THUMB DEBRIDEMENT INTERPHALANGEAL JOINT LEFT THUMB REMOVAL MUCOID CYST ;  Surgeon: Daryll Brod, MD;  Location: [facility];  Service: Orthopedics;  Laterality: Left;  . FINGER SURGERY  2002   lt long finger mass  . GANGLION CYST EXCISION Left 2/02  . TOTAL ABDOMINAL HYSTERECTOMY W/ BILATERAL SALPINGOOPHORECTOMY  1997  . TUBAL LIGATION  1977    Family History  Problem Relation Age of Onset  . Heart disease Mother   . Hyperlipidemia Mother   . Thyroid disease Mother   . Osteoporosis Mother   . Dementia Mother   . Breast cancer Mother 78  . Diabetes Father   . Heart disease Father   . Cancer Father        prostate  . Cancer Brother        Eye   Social History:  reports that she has never smoked. She has never used smokeless tobacco. She reports current alcohol use of about 1.0 standard drinks of alcohol per week. She reports that she does not use drugs.  Allergies:  Allergies  Allergen Reactions  . Ofloxacin Itching and Swelling    No medications prior to admission.    No results found for this or any previous visit (from the past 48 hour(s)).  No results found.   Pertinent items are noted in HPI.  Height 5' 5.5" (1.664  m), weight 68.9 kg.  General appearance: alert, cooperative and appears stated age Head: Normocephalic, without obvious abnormality Neck: no JVD Resp: clear to auscultation bilaterally Cardio: regular rate and rhythm, S1, S2 normal, no murmur, click, rub or gallop GI: soft, non-tender; bowel sounds normal; no masses,  no organomegaly Extremities: right thumb pain Pulses: 2+ and symmetric Skin: Skin color, texture, turgor normal. No rashes or lesions Neurologic: Grossly normal Incision/Wound: na  Assessment/Plan Diagnose CMC arthritis right thumb Plan: She would like to proceed to have this surgically corrected. We recommend arthroplasty with  micro link suture removal including excision of the trapezium and tendon transfer. Preperi-and postoperative course are discussed along with risks and complications. She is aware there is no guarantee to the surgery possibility of infection recurrence injury to arteries nerves tendons incomplete relief symptoms dystrophy. She is scheduled for suspension plasty with micro link support right thumb as an outpatient under regional anesthesia.     Daryll Brod 05/29/2019, 5:27 AM

## 2019-05-29 NOTE — Transfer of Care (Signed)
Immediate Anesthesia Transfer of Care Note  Patient: PAIZLEIGH WILDS  Procedure(s) Performed: CARPOMETACARPEL ([place]) SUSPENSION PLASTY excision trapexzium tendon transfer, microlink suspection plasty (Right Thumb)  Patient Location: PACU  Anesthesia Type:MAC combined with regional for post-op pain  Level of Consciousness: awake, alert  and oriented  Airway & Oxygen Therapy: Patient Spontanous Breathing and Patient connected to nasal cannula oxygen  Post-op Assessment: Report given to RN and Post -op Vital signs reviewed and stable  Post vital signs: Reviewed and stable  Last Vitals:  Vitals Value Taken Time  BP 108/58 05/29/19 0955  Temp    Pulse 70 05/29/19 0958  Resp 13 05/29/19 0958  SpO2 99 % 05/29/19 0958  Vitals shown include unvalidated device data.  Last Pain:  Vitals:   05/29/19 0737  TempSrc: Oral  PainSc: 0-No pain      Patients Stated Pain Goal: 5 (07/20/79 1886)  Complications: No apparent anesthesia complications

## 2019-05-29 NOTE — Progress Notes (Signed)
Assisted Dr. Daiva Huge with right, ultrasound guided, supraclavicular block. Side rails up, monitors on throughout procedure. See vital signs in flow sheet. Tolerated Procedure well.

## 2019-05-29 NOTE — Discharge Instructions (Signed)
[facility] Instructions Hand Surgery  Wound Care: Keep your hand elevated above the level of your heart.  Do not allow it to dangle by your side.  Keep the dressing dry and do not remove it unless your doctor advises you to do so.  He will usually change it at the time of your post-op visit.  Moving your fingers is advised to stimulate circulation but will depend on the site of your surgery.  If you have a splint applied, your doctor will advise you regarding movement.  Activity: Do not drive or operate machinery today.  Rest today and then you may return to your normal activity and work as indicated by your physician.  Diet:  Drink liquids today or eat a light diet.  You may resume a regular diet tomorrow.    General expectations: Pain for two to three days. Fingers may become slightly swollen.  Call your doctor if any of the following occur: Severe pain not relieved by pain medication. Elevated temperature. Dressing soaked with blood. Inability to move fingers. White or bluish color to fingers.   Regional Anesthesia Blocks  1. Numbness or the inability to move the "blocked" extremity may last from 3-48 hours after placement. The length of time depends on the medication injected and your individual response to the medication. If the numbness is not going away after 48 hours, call your surgeon.  2. The extremity that is blocked will need to be protected until the numbness is gone and the  Strength has returned. Because you cannot feel it, you will need to take extra care to avoid injury. Because it may be weak, you may have difficulty moving it or using it. You may not know what position it is in without looking at it while the block is in effect.  3. For blocks in the legs and feet, returning to weight bearing and walking needs to be done carefully. You will need to wait until the numbness is entirely gone and the strength has returned. You should be able to move your leg and foot  normally before you try and bear weight or walk. You will need someone to be with you when you first try to ensure you do not fall and possibly risk injury.  4. Bruising and tenderness at the needle site are common side effects and will resolve in a few days.  5. Persistent numbness or new problems with movement should be communicated to the surgeon or the [facility] [id number] [place] [phone]).    Post Anesthesia Home Care Instructions  Activity: Get plenty of rest for the remainder of the day. A responsible individual must stay with you for 24 hours following the procedure.  For the next 24 hours, DO NOT: -Drive a car -Paediatric nurse -Drink alcoholic beverages -Take any medication unless instructed by your physician -Make any legal decisions or sign important papers.  Meals: Start with liquid foods such as gelatin or soup. Progress to regular foods as tolerated. Avoid greasy, spicy, heavy foods. If nausea and/or vomiting occur, drink only clear liquids until the nausea and/or vomiting subsides. Call your physician if vomiting continues.  Special Instructions/Symptoms: Your throat may feel dry or sore from the anesthesia or the breathing tube placed in your throat during surgery. If this causes discomfort, gargle with warm salt water. The discomfort should disappear within 24 hours.  If you had a scopolamine patch placed behind your ear for the management of post- operative nausea  and/or vomiting:  1. The medication in the patch is effective for 72 hours, after which it should be removed.  Wrap patch in a tissue and discard in the trash. Wash hands thoroughly with soap and water. 2. You may remove the patch earlier than 72 hours if you experience unpleasant side effects which may include dry mouth, dizziness or visual disturbances. 3. Avoid touching the patch. Wash your hands with soap and water after contact with the patch.    NOTE: You  received Tylenol at 7:45 am and may repeat Tylenol at 11:45 am or later if needed.

## 2019-05-29 NOTE — Brief Op Note (Signed)
05/29/2019  9:42 AM  PATIENT:  [name]  74 y.o. female  PRE-OPERATIVE DIAGNOSIS:  CARPOMETACARPAL ARTHRITIS RIGHT THUMB  POST-OPERATIVE DIAGNOSIS:  CARPOMETACARPAL ARTHRITIS RIGHT THUMB  PROCEDURE:  Procedure(s) with comments: CARPOMETACARPEL ([place]) SUSPENSION PLASTY excision trapexzium tendon transfer, microlink suspection plasty (Right) - axillary block  SURGEON:  Surgeon(s) and Role:    * Daryll Brod, MD - Primary  PHYSICIAN ASSISTANT:   ASSISTANTS: R Dasnoit, PAC   ANESTHESIA:   regional and IV sedation  EBL: 73ml  BLOOD ADMINISTERED:none  DRAINS: none   LOCAL MEDICATIONS USED:  NONE  SPECIMEN:  No Specimen  DISPOSITION OF SPECIMEN:  PATHOLOGY  COUNTS:  YES  TOURNIQUET:  * Missing tourniquet times found for documented tourniquets in log: [id number] *  DICTATION: .Dragon Dictation  PLAN OF CARE: Discharge to home after PACU  PATIENT DISPOSITION:  PACU - hemodynamically stable.

## 2019-05-30 ENCOUNTER — Encounter: Payer: Self-pay | Admitting: *Deleted

## 2019-06-06 ENCOUNTER — Ambulatory Visit: Payer: Medicare Other

## 2019-06-06 DIAGNOSIS — M79644 Pain in right finger(s): Secondary | ICD-10-CM | POA: Diagnosis not present

## 2019-06-06 DIAGNOSIS — M180 Bilateral primary osteoarthritis of first carpometacarpal joints: Secondary | ICD-10-CM | POA: Diagnosis not present

## 2019-06-06 DIAGNOSIS — M25641 Stiffness of right hand, not elsewhere classified: Secondary | ICD-10-CM | POA: Diagnosis not present

## 2019-06-27 ENCOUNTER — Ambulatory Visit: Payer: Medicare Other

## 2019-06-29 DIAGNOSIS — M25641 Stiffness of right hand, not elsewhere classified: Secondary | ICD-10-CM | POA: Diagnosis not present

## 2019-06-29 DIAGNOSIS — M79644 Pain in right finger(s): Secondary | ICD-10-CM | POA: Diagnosis not present

## 2019-06-29 DIAGNOSIS — M180 Bilateral primary osteoarthritis of first carpometacarpal joints: Secondary | ICD-10-CM | POA: Diagnosis not present

## 2019-07-13 DIAGNOSIS — M79644 Pain in right finger(s): Secondary | ICD-10-CM | POA: Diagnosis not present

## 2019-07-13 DIAGNOSIS — M180 Bilateral primary osteoarthritis of first carpometacarpal joints: Secondary | ICD-10-CM | POA: Diagnosis not present

## 2019-07-13 DIAGNOSIS — M25641 Stiffness of right hand, not elsewhere classified: Secondary | ICD-10-CM | POA: Diagnosis not present

## 2019-07-16 DIAGNOSIS — D6851 Activated protein C resistance: Secondary | ICD-10-CM | POA: Diagnosis not present

## 2019-07-16 DIAGNOSIS — E7800 Pure hypercholesterolemia, unspecified: Secondary | ICD-10-CM | POA: Diagnosis not present

## 2019-07-16 DIAGNOSIS — D6859 Other primary thrombophilia: Secondary | ICD-10-CM | POA: Diagnosis not present

## 2019-07-16 DIAGNOSIS — Z0000 Encounter for general adult medical examination without abnormal findings: Secondary | ICD-10-CM | POA: Diagnosis not present

## 2019-07-16 DIAGNOSIS — Z131 Encounter for screening for diabetes mellitus: Secondary | ICD-10-CM | POA: Diagnosis not present

## 2019-07-16 DIAGNOSIS — Z8249 Family history of ischemic heart disease and other diseases of the circulatory system: Secondary | ICD-10-CM | POA: Diagnosis not present

## 2019-07-16 DIAGNOSIS — M810 Age-related osteoporosis without current pathological fracture: Secondary | ICD-10-CM | POA: Diagnosis not present

## 2019-07-18 ENCOUNTER — Other Ambulatory Visit: Payer: Self-pay | Admitting: Family Medicine

## 2019-07-18 DIAGNOSIS — Z8249 Family history of ischemic heart disease and other diseases of the circulatory system: Secondary | ICD-10-CM

## 2019-07-23 DIAGNOSIS — M25641 Stiffness of right hand, not elsewhere classified: Secondary | ICD-10-CM | POA: Diagnosis not present

## 2019-07-23 DIAGNOSIS — M79644 Pain in right finger(s): Secondary | ICD-10-CM | POA: Diagnosis not present

## 2019-07-23 DIAGNOSIS — M180 Bilateral primary osteoarthritis of first carpometacarpal joints: Secondary | ICD-10-CM | POA: Diagnosis not present

## 2019-07-26 ENCOUNTER — Ambulatory Visit
Admission: RE | Admit: 2019-07-26 | Discharge: 2019-07-26 | Disposition: A | Discharge status: Home / Self Care | Payer: Medicare Other | Source: Ambulatory Visit | Attending: Family Medicine | Admitting: Family Medicine

## 2019-07-26 DIAGNOSIS — Z136 Encounter for screening for cardiovascular disorders: Secondary | ICD-10-CM | POA: Diagnosis not present

## 2019-07-26 DIAGNOSIS — Z8249 Family history of ischemic heart disease and other diseases of the circulatory system: Secondary | ICD-10-CM

## 2019-07-30 DIAGNOSIS — M25641 Stiffness of right hand, not elsewhere classified: Secondary | ICD-10-CM | POA: Diagnosis not present

## 2019-07-30 DIAGNOSIS — M180 Bilateral primary osteoarthritis of first carpometacarpal joints: Secondary | ICD-10-CM | POA: Diagnosis not present

## 2019-07-30 DIAGNOSIS — M79644 Pain in right finger(s): Secondary | ICD-10-CM | POA: Diagnosis not present

## 2019-08-03 ENCOUNTER — Ambulatory Visit: Payer: Medicare Other | Admitting: Obstetrics & Gynecology

## 2019-08-27 DIAGNOSIS — M180 Bilateral primary osteoarthritis of first carpometacarpal joints: Secondary | ICD-10-CM | POA: Diagnosis not present

## 2019-08-27 DIAGNOSIS — M25641 Stiffness of right hand, not elsewhere classified: Secondary | ICD-10-CM | POA: Diagnosis not present

## 2019-08-27 DIAGNOSIS — M79644 Pain in right finger(s): Secondary | ICD-10-CM | POA: Diagnosis not present

## 2019-09-03 DIAGNOSIS — M180 Bilateral primary osteoarthritis of first carpometacarpal joints: Secondary | ICD-10-CM | POA: Diagnosis not present

## 2019-09-03 DIAGNOSIS — M25641 Stiffness of right hand, not elsewhere classified: Secondary | ICD-10-CM | POA: Diagnosis not present

## 2019-09-03 DIAGNOSIS — M79644 Pain in right finger(s): Secondary | ICD-10-CM | POA: Diagnosis not present

## 2019-09-11 DIAGNOSIS — M79644 Pain in right finger(s): Secondary | ICD-10-CM | POA: Diagnosis not present

## 2019-09-11 DIAGNOSIS — M25641 Stiffness of right hand, not elsewhere classified: Secondary | ICD-10-CM | POA: Diagnosis not present

## 2019-09-11 DIAGNOSIS — M180 Bilateral primary osteoarthritis of first carpometacarpal joints: Secondary | ICD-10-CM | POA: Diagnosis not present

## 2019-09-17 DIAGNOSIS — M180 Bilateral primary osteoarthritis of first carpometacarpal joints: Secondary | ICD-10-CM | POA: Diagnosis not present

## 2019-09-17 DIAGNOSIS — M79644 Pain in right finger(s): Secondary | ICD-10-CM | POA: Diagnosis not present

## 2019-09-17 DIAGNOSIS — M25641 Stiffness of right hand, not elsewhere classified: Secondary | ICD-10-CM | POA: Diagnosis not present

## 2019-09-25 DIAGNOSIS — M180 Bilateral primary osteoarthritis of first carpometacarpal joints: Secondary | ICD-10-CM | POA: Diagnosis not present

## 2019-09-25 DIAGNOSIS — M79644 Pain in right finger(s): Secondary | ICD-10-CM | POA: Diagnosis not present

## 2019-09-25 DIAGNOSIS — M25641 Stiffness of right hand, not elsewhere classified: Secondary | ICD-10-CM | POA: Diagnosis not present

## 2019-10-01 DIAGNOSIS — M180 Bilateral primary osteoarthritis of first carpometacarpal joints: Secondary | ICD-10-CM | POA: Diagnosis not present

## 2019-10-01 DIAGNOSIS — M79644 Pain in right finger(s): Secondary | ICD-10-CM | POA: Diagnosis not present

## 2019-10-01 DIAGNOSIS — M25641 Stiffness of right hand, not elsewhere classified: Secondary | ICD-10-CM | POA: Diagnosis not present

## 2019-10-05 DIAGNOSIS — M779 Enthesopathy, unspecified: Secondary | ICD-10-CM | POA: Diagnosis not present

## 2019-11-20 ENCOUNTER — Other Ambulatory Visit: Payer: Self-pay | Admitting: Obstetrics & Gynecology

## 2019-11-20 DIAGNOSIS — Z1231 Encounter for screening mammogram for malignant neoplasm of breast: Secondary | ICD-10-CM

## 2019-12-13 DIAGNOSIS — H40013 Open angle with borderline findings, low risk, bilateral: Secondary | ICD-10-CM | POA: Diagnosis not present

## 2019-12-17 DIAGNOSIS — N3000 Acute cystitis without hematuria: Secondary | ICD-10-CM | POA: Diagnosis not present

## 2019-12-17 DIAGNOSIS — R3915 Urgency of urination: Secondary | ICD-10-CM | POA: Diagnosis not present

## 2019-12-26 ENCOUNTER — Other Ambulatory Visit: Payer: Self-pay

## 2019-12-26 ENCOUNTER — Ambulatory Visit
Admission: RE | Admit: 2019-12-26 | Discharge: 2019-12-26 | Disposition: A | Discharge status: Home / Self Care | Payer: Medicare Other | Source: Ambulatory Visit | Attending: Obstetrics & Gynecology | Admitting: Obstetrics & Gynecology

## 2019-12-26 DIAGNOSIS — Z1231 Encounter for screening mammogram for malignant neoplasm of breast: Secondary | ICD-10-CM

## 2020-02-14 DIAGNOSIS — Z23 Encounter for immunization: Secondary | ICD-10-CM | POA: Diagnosis not present

## 2020-02-20 DIAGNOSIS — M180 Bilateral primary osteoarthritis of first carpometacarpal joints: Secondary | ICD-10-CM | POA: Diagnosis not present

## 2020-02-20 DIAGNOSIS — Z4789 Encounter for other orthopedic aftercare: Secondary | ICD-10-CM | POA: Diagnosis not present

## 2020-03-05 DIAGNOSIS — Z23 Encounter for immunization: Secondary | ICD-10-CM | POA: Diagnosis not present

## 2020-04-16 ENCOUNTER — Other Ambulatory Visit: Payer: Self-pay

## 2020-04-16 ENCOUNTER — Encounter (HOSPITAL_BASED_OUTPATIENT_CLINIC_OR_DEPARTMENT_OTHER): Payer: Self-pay

## 2020-04-16 ENCOUNTER — Emergency Department (HOSPITAL_BASED_OUTPATIENT_CLINIC_OR_DEPARTMENT_OTHER): Payer: Medicare Other

## 2020-04-16 ENCOUNTER — Emergency Department (HOSPITAL_BASED_OUTPATIENT_CLINIC_OR_DEPARTMENT_OTHER)
Admission: EM | Admit: 2020-04-16 | Discharge: 2020-04-16 | Disposition: A | Discharge status: Home / Self Care | Payer: Medicare Other | Attending: Emergency Medicine | Admitting: Emergency Medicine

## 2020-04-16 DIAGNOSIS — S59912A Unspecified injury of left forearm, initial encounter: Secondary | ICD-10-CM | POA: Diagnosis present

## 2020-04-16 DIAGNOSIS — S51812A Laceration without foreign body of left forearm, initial encounter: Secondary | ICD-10-CM

## 2020-04-16 DIAGNOSIS — W010XXA Fall on same level from slipping, tripping and stumbling without subsequent striking against object, initial encounter: Secondary | ICD-10-CM | POA: Diagnosis not present

## 2020-04-16 DIAGNOSIS — W19XXXA Unspecified fall, initial encounter: Secondary | ICD-10-CM

## 2020-04-16 DIAGNOSIS — M7989 Other specified soft tissue disorders: Secondary | ICD-10-CM | POA: Diagnosis not present

## 2020-04-16 DIAGNOSIS — Z23 Encounter for immunization: Secondary | ICD-10-CM | POA: Insufficient documentation

## 2020-04-16 DIAGNOSIS — J45909 Unspecified asthma, uncomplicated: Secondary | ICD-10-CM | POA: Diagnosis not present

## 2020-04-16 MED ORDER — ACETAMINOPHEN 500 MG PO TABS
1000.0000 mg | ORAL_TABLET | Freq: Once | ORAL | Status: AC
  Administered 2020-04-16: 1000 mg via ORAL
  Filled 2020-04-16: qty 2

## 2020-04-16 MED ORDER — TETANUS-DIPHTH-ACELL PERTUSSIS 5-2.5-18.5 LF-MCG/0.5 IM SUSY
0.5000 mL | PREFILLED_SYRINGE | Freq: Once | INTRAMUSCULAR | Status: AC
  Administered 2020-04-16: 0.5 mL via INTRAMUSCULAR
  Filled 2020-04-16: qty 0.5

## 2020-04-16 MED ORDER — LIDOCAINE-EPINEPHRINE (PF) 2 %-1:200000 IJ SOLN
20.0000 mL | Freq: Once | INTRAMUSCULAR | Status: AC
  Administered 2020-04-16: 20 mL
  Filled 2020-04-16: qty 20

## 2020-04-16 NOTE — ED Triage Notes (Addendum)
Pt states she tripped/fell ~2pm-lac to left FA-no bleeding-saline wet/dry dsg applied-also c/o pain to left lower back/hip-NAD-steady gait

## 2020-04-16 NOTE — ED Provider Notes (Signed)
[facility] EMERGENCY DEPARTMENT Provider Note   CSN: [id number] Arrival date & time: 04/16/20  1422     History Chief Complaint  Patient presents with  . Fall    [name] is a 74 y.o. female history of osteoarthritis, osteoporosis and asthma.  Patient reports that she suffered a fall approximately 14:00.  Patient reports that she stepped off a wooden pallet that was on the floor, her balance and fell landing on her left side.  Patient reports that she did not hit her head she denies any loss of consciousness.  She was ambulatory after her fall.  Denied any pain with ambulation.  Patient denies any neck or back pain.  Patient is not on any blood thinners.  Complains of pain to her left forearm, she rates the pain as a 7 out of 10 on the pain scale, it is constant, and worse with movement.  She reports that she has a laceration to the left forearm.  Patient is not sure when her last tetanus shot was.  Patient denies any numbness or tingling, headache, lightheadedness, dizziness, visual disturbance, nausea or vomiting.  HPI     Past Medical History:  Diagnosis Date  . Contact lens/glasses fitting    wears one contact lt eye  . Heterozygous factor V Leiden mutation ([place])   . Osteopenia   . PONV (postoperative nausea and vomiting)   . Superficial vein thrombosis 1997  . Varicose veins of both lower extremities     Patient Active Problem List   Diagnosis Date Noted  . Primary osteoarthritis of both first carpometacarpal joints 05/31/2018  . Osteoporosis 12/04/2015  . Bartholin's gland abscess 03/08/2014  . ASTHMA 10/05/2007  . OSTEOARTHRITIS 10/05/2007    Past Surgical History:  Procedure Laterality Date  . BARTHOLIN CYST MARSUPIALIZATION    . BARTHOLIN CYST MARSUPIALIZATION Right 08/19/2014   Procedure: BARTHOLIN CYST MARSUPIALIZATION;  Surgeon: Megan Salon, MD;  Location: [facility];  Service: Gynecology;  Laterality: Right;  . CARPAL TUNNEL RELEASE Right  12/20/2012   Procedure: CARPAL TUNNEL RELEASE;  Surgeon: Wynonia Sours, MD;  Location: [facility];  Service: Orthopedics;  Laterality: Right;  . CARPOMETACARPEL SUSPENSION PLASTY Right 05/29/2019   Procedure: CARPOMETACARPEL [facility]) SUSPENSION PLASTY excision trapexzium tendon transfer, microlink suspection plasty;  Surgeon: Daryll Brod, MD;  Location: [facility];  Service: Orthopedics;  Laterality: Right;  axillary block  . CATARACT EXTRACTION Right 2009      . [place]   x2  . EAR CYST EXCISION Left 08/15/2014   Procedure: LEFT THUMB DEBRIDEMENT INTERPHALANGEAL JOINT LEFT THUMB REMOVAL MUCOID CYST ;  Surgeon: Daryll Brod, MD;  Location: [facility];  Service: Orthopedics;  Laterality: Left;  . FINGER SURGERY  2002   lt long finger mass  . GANGLION CYST EXCISION Left 2/02  . TOTAL ABDOMINAL HYSTERECTOMY W/ BILATERAL SALPINGOOPHORECTOMY  1997  . TUBAL LIGATION  1977     OB History    Gravida  2   Para  2   Term      Preterm      AB      Living        SAB      TAB      Ectopic      Multiple      Live Births              Family History  Problem Relation Age of Onset  .  Heart disease Mother   . Hyperlipidemia Mother   . Thyroid disease Mother   . Osteoporosis Mother   . Dementia Mother   . Breast cancer Mother 65  . Diabetes Father   . Heart disease Father   . Cancer Father        prostate  . Cancer Brother        Eye    Social History   Tobacco Use  . Smoking status: Never Smoker  . Smokeless tobacco: Never Used  Vaping Use  . Vaping Use: Never used  Substance Use Topics  . Alcohol use: Yes    Comment: glass of wine weeklu  . Drug use: No    Home Medications Prior to Admission medications   Medication Sig Start Date End Date Taking? Authorizing Provider  acetaminophen (TYLENOL) 500 MG tablet Take 500 mg by mouth every 4 (four) hours as needed for mild pain.    [provider]   calcium carbonate (OS-CAL) 600 MG TABS tablet Take 600 mg by mouth 2 (two) times daily with a meal.    [provider]  calcium carbonate (TUMS - DOSED IN MG ELEMENTAL CALCIUM) 500 MG chewable tablet Chew 1 tablet by mouth as needed for indigestion or heartburn.    [provider]  cholecalciferol (VITAMIN D3) 25 MCG (1000 UNIT) tablet Take 1,000 Units by mouth.    [provider]  fish oil-omega-3 fatty acids 1000 MG capsule Take 2 g by mouth daily.    [provider]  Flaxseed, Linseed, (FLAX SEED OIL PO) Take 1 capsule by mouth daily.     [provider]  ibuprofen (ADVIL,MOTRIN) 200 MG tablet Take 200 mg by mouth every 6 (six) hours as needed for moderate pain.    [provider]  Multiple Vitamins-Minerals (MULTIVITAMIN WITH MINERALS) tablet Take 1 tablet by mouth daily.    [provider]  naproxen sodium (ANAPROX) 220 MG tablet Take 220 mg by mouth daily.     [provider]  NONFORMULARY OR COMPOUNDED ITEM Estradiol cream 0.02% in 46ml prefilled syringes.  11ml pv twice weekly.  #51month supply. 06/23/18   Megan Salon, MD  oxyCODONE-acetaminophen (PERCOCET) 5-325 MG tablet Take 1 tablet by mouth every 4 (four) hours as needed for severe pain. 05/29/19 05/28/20  Daryll Brod, MD  Plant Sterols and Stanols (CHOLESTOFF PO) Take by mouth daily.     [provider]  vitamin C (ASCORBIC ACID) 500 MG tablet Take 500 mg by mouth daily.    [provider]    Allergies    Adhesive [tape], Ciprofloxacin, and Ofloxacin  Review of Systems   Review of Systems  Constitutional: Negative for chills and fever.  HENT: Negative for facial swelling.   Eyes: Negative for visual disturbance.  Respiratory: Negative for shortness of breath.   Cardiovascular: Negative for chest pain and palpitations.  Gastrointestinal: Negative for abdominal distention, abdominal pain, nausea and vomiting.  Genitourinary: Negative for  difficulty urinating and dysuria.  Musculoskeletal: Negative for back pain and neck pain.  Skin: Negative for color change and rash.  Neurological: Negative for dizziness, tremors, seizures, syncope, facial asymmetry, speech difficulty, weakness, light-headedness, numbness and headaches.  Psychiatric/Behavioral: Negative for confusion.    Physical Exam Updated Vital Signs BP (!) 144/70 (BP Location: Right Arm)   Pulse 70   Temp 98.3 F (36.8 C) (Oral)   Resp 16   SpO2 100%   Physical Exam Constitutional:      General:  She is not in acute distress.    Appearance: She is not ill-appearing, toxic-appearing or diaphoretic.  HENT:     Head: Normocephalic and atraumatic.  Eyes:     Pupils: Pupils are equal, round, and reactive to light.  Cardiovascular:     Rate and Rhythm: Normal rate.     Heart sounds: Normal heart sounds.  Pulmonary:     Effort: Pulmonary effort is normal.     Breath sounds: Normal breath sounds.  Chest:     Chest wall: No tenderness.  Abdominal:     Palpations: Abdomen is soft.     Tenderness: There is no abdominal tenderness.  Musculoskeletal:     Right forearm: No lacerations or tenderness.     Left forearm: Laceration and tenderness present.     Right wrist: No swelling, deformity, tenderness or bony tenderness.     Left wrist: No swelling, deformity, tenderness or bony tenderness.     Cervical back: Normal range of motion and neck supple. No torticollis or crepitus. No spinous process tenderness or muscular tenderness. Normal range of motion.     Thoracic back: No deformity, tenderness or bony tenderness.     Lumbar back: No deformity, tenderness or bony tenderness.     Comments: She complained of pain to the left shoulder on passive range of motion.  Passive range of motion to all other areas were unremarkable.  Skin:    General: Skin is warm and dry.  Neurological:     General: No focal deficit present.     Mental Status: She is alert.     GCS:  GCS eye subscore is 4. GCS verbal subscore is 5. GCS motor subscore is 6.     Cranial Nerves: No cranial nerve deficit.     Motor: No weakness, tremor or seizure activity.     Gait: Gait is intact. Gait normal.  Psychiatric:        Behavior: Behavior is cooperative.       ED Results / Procedures / Treatments   Labs (all labs ordered are listed, but only abnormal results are displayed) Labs Reviewed - No data to display  EKG None  Radiology DG Forearm Left  Result Date: 04/16/2020 CLINICAL DATA:  Trip and fall with laceration to LEFT forearm EXAM: LEFT FOREARM - 2 VIEW COMPARISON:  None FINDINGS: No acute fracture or dislocation. Soft tissues with some swelling and impression with gauze overlying the mid forearm, potential laceration along the volar surface and medial surface of the forearm. No radiopaque foreign body. IMPRESSION: 1. No acute fracture or dislocation. No radiopaque foreign body. 2. Soft tissue swelling and deformity overlying the mid forearm, potential laceration. Electronically Signed   By: Zetta Bills M.D.   On: 04/16/2020 15:45    Procedures .Marland KitchenLaceration Repair  Date/Time: 04/16/2020 6:22 PM Performed by: Loni Beckwith, PA-C Authorized by: Loni Beckwith, PA-C   Consent:    Consent obtained:  Verbal   Consent given by:  Patient   Risks discussed:  Infection, need for additional repair, pain, poor cosmetic result, poor wound healing and retained foreign body   Alternatives discussed:  No treatment Universal protocol:    Procedure explained and questions answered to patient or proxy's satisfaction: yes     Relevant documents present and verified: yes     Imaging studies available: yes     Immediately prior to procedure, a time out was called: yes     Patient identity confirmed:  Verbally with  patient Anesthesia (see MAR for exact dosages):    Anesthesia method:  Local infiltration   Local anesthetic:  Lidocaine 1% WITH epi Laceration  details:    Location:  Shoulder/arm   Shoulder/arm location:  L lower arm   Length (cm):  4   Depth (mm):  5 Repair type:    Repair type:  Simple Pre-procedure details:    Preparation:  Patient was prepped and draped in usual sterile fashion and imaging obtained to evaluate for foreign bodies Exploration:    Hemostasis achieved with:  Direct pressure   Wound exploration: entire depth of wound probed and visualized     Wound extent: no foreign bodies/material noted, no muscle damage noted and no tendon damage noted   Treatment:    Area cleansed with:  Betadine   Amount of cleaning:  Standard   Irrigation solution:  Sterile saline   Irrigation method:  Syringe   Visualized foreign bodies/material removed: no   Skin repair:    Repair method:  Sutures   Suture size:  4-0   Suture material:  Prolene   Suture technique:  Simple interrupted   Number of sutures:  4 Approximation:    Approximation:  Close Post-procedure details:    Dressing:  Antibiotic ointment and sterile dressing   Patient tolerance of procedure:  Tolerated well, no immediate complications   (including critical care time)  Medications Ordered in ED Medications  acetaminophen (TYLENOL) tablet 1,000 mg (1,000 mg Oral Given 04/16/20 1728)  Tdap (BOOSTRIX) injection 0.5 mL (0.5 mLs Intramuscular Given 04/16/20 1729)  lidocaine-EPINEPHrine (XYLOCAINE W/EPI) 2 %-1:200000 (PF) injection 20 mL (20 mLs Infiltration Given by Other 04/16/20 1732)    ED Course  I have reviewed the triage vital signs and the nursing notes.  Pertinent labs & imaging results that were available during my care of the patient were reviewed by me and considered in my medical decision making (see chart for details).    MDM Rules/Calculators/A&P                          Alert 73 year old female no acute distress.  Patient does not meet Nexus criteria for CT scan.  Patient complained of pain to left shoulder on passive range of motion, she reports  this is her baseline, she refused any x-ray of left shoulder.    X-ray of left forearm showed no acute fracture or dislocation and no radiopaque foreign bodies present.  Laceration to left forearm was sutured, see note for further details.  Unknown last tetanus shot therefore patient received update today.  Patient will follow up with primary care in 7 days to have her sutures removed.  Patient given for OTC pain management.  Patient was given strict return precautions.  Patient expressed understanding of all directions.   Final Clinical Impression(s) / ED Diagnoses Final diagnoses:  Fall, initial encounter  Laceration of left forearm, initial encounter    Rx / DC Orders ED Discharge Orders    None       Dyann Ruddle 04/17/20 Jorge Ny, MD 04/17/20 1556

## 2020-04-16 NOTE — Discharge Instructions (Signed)
You came to the emergency department today to have your left forearm evaluated after suffering a fall.  Your x-ray showed no fractures or dislocation.  Your physical exam was reassuring that no head or spinal injury occurred.    Your laceration on your left forearm required stitches.  Please keep the stitches covered and dry for the next 24 hours.  After that you may gently wash the area with warm soap and water.  You may use antibacterial ointment.  You may keep the wound covered with a clean dry dressing.  Please see your primary care provider in 7 days to have the stitches removed.  You may also go to an urgent care or return to the emergency department to have these sutures removed.  Please take Ibuprofen (Advil, motrin) and Tylenol (acetaminophen) to relieve your pain.  You may take up to 600 MG (3 pills) of normal strength ibuprofen every 8 hours as needed.  In between doses of ibuprofen you make take tylenol, up to 1,000 mg (two extra strength pills).  Do not take more than 3,000 mg tylenol in a 24 hour period.  Please check all medication labels as many medications such as pain and cold medications may contain tylenol.  Do not drink alcohol while taking these medications.  Do not take other NSAID'S while taking ibuprofen (such as aleve or naproxen).  Please take ibuprofen with food to decrease stomach upset.   Get help right away if: A very bad headache that is not helped by medicine. Trouble walking or weakness in your arms and legs. Clear or bloody fluid coming from your nose or ears. Changes in how you see (vision). Shaking movements that you cannot control. You lose your balance. You vomit. The black centers of your eyes (pupils) change in size. Your speech is slurred. Your dizziness gets worse. You pass out. You are sleepier than normal and have trouble staying awake You develop severe swelling around the wound. Your pain suddenly increases and is severe. You develop painful lumps  near the wound or on skin anywhere else on your body. You have a red streak going away from your wound. The wound is on your hand or foot and you cannot properly move a finger or toe. The wound is on your hand or foot, and you notice that your fingers or toes look pale or bluish.

## 2020-04-24 DIAGNOSIS — S51812D Laceration without foreign body of left forearm, subsequent encounter: Secondary | ICD-10-CM | POA: Diagnosis not present

## 2020-04-24 DIAGNOSIS — Z4802 Encounter for removal of sutures: Secondary | ICD-10-CM | POA: Diagnosis not present

## 2020-06-17 DIAGNOSIS — H40013 Open angle with borderline findings, low risk, bilateral: Secondary | ICD-10-CM | POA: Diagnosis not present

## 2020-06-17 DIAGNOSIS — H35362 Drusen (degenerative) of macula, left eye: Secondary | ICD-10-CM | POA: Diagnosis not present

## 2020-06-17 DIAGNOSIS — Z961 Presence of intraocular lens: Secondary | ICD-10-CM | POA: Diagnosis not present

## 2020-07-09 DIAGNOSIS — D3132 Benign neoplasm of left choroid: Secondary | ICD-10-CM | POA: Diagnosis not present

## 2020-07-09 DIAGNOSIS — H43813 Vitreous degeneration, bilateral: Secondary | ICD-10-CM | POA: Diagnosis not present

## 2020-07-11 ENCOUNTER — Other Ambulatory Visit: Payer: Self-pay

## 2020-07-11 ENCOUNTER — Encounter (HOSPITAL_BASED_OUTPATIENT_CLINIC_OR_DEPARTMENT_OTHER): Payer: Self-pay | Admitting: Obstetrics & Gynecology

## 2020-07-11 ENCOUNTER — Ambulatory Visit (INDEPENDENT_AMBULATORY_CARE_PROVIDER_SITE_OTHER): Payer: Medicare Other | Admitting: Obstetrics & Gynecology

## 2020-07-11 VITALS — BP 142/76 | HR 58 | Ht 65.0 in | Wt 148.0 lb

## 2020-07-11 DIAGNOSIS — N751 Abscess of Bartholin's gland: Secondary | ICD-10-CM

## 2020-07-11 DIAGNOSIS — D6851 Activated protein C resistance: Secondary | ICD-10-CM | POA: Diagnosis not present

## 2020-07-11 DIAGNOSIS — M810 Age-related osteoporosis without current pathological fracture: Secondary | ICD-10-CM

## 2020-07-11 DIAGNOSIS — M8580 Other specified disorders of bone density and structure, unspecified site: Secondary | ICD-10-CM | POA: Diagnosis not present

## 2020-07-11 DIAGNOSIS — Z90710 Acquired absence of both cervix and uterus: Secondary | ICD-10-CM | POA: Diagnosis not present

## 2020-07-11 DIAGNOSIS — Z01411 Encounter for gynecological examination (general) (routine) with abnormal findings: Secondary | ICD-10-CM

## 2020-07-11 NOTE — Progress Notes (Signed)
75 y.o. G2P2 Married White or Caucasian female here for breast and pelvic exam.  I am also following her for recurrent UTI.  Using vaginal estrogen cream that is compounded.  Doesn't need refill right now.  Denies vaginal bleeding.  I follow her BMD tests at well.  No LMP recorded. Patient has had a hysterectomy.          Sexually active: Yes.    H/O STD:  no  Health Maintenance: PCP:  Dr. Harrington Challenger.  Last wellness appt was 2020 but has appt next week.  Will have blood work done at that appt. Vaccines are up to date:  yes Colonoscopy:  10/17 Dr. Watt Climes, follow up 10 eyars MMG:  12/2019 BMD:  2020, -1.7 Last pap smear:  2011   H/o abnormal pap smear:  no   reports that she has never smoked. She has never used smokeless tobacco. She reports current alcohol use. She reports that she does not use drugs.  Past Medical History:  Diagnosis Date  . Contact lens/glasses fitting    wears one contact lt eye  . Heterozygous factor V Leiden mutation ([place])   . Osteopenia   . PONV (postoperative nausea and vomiting)   . Superficial vein thrombosis 1997  . Varicose veins of both lower extremities     Past Surgical History:  Procedure Laterality Date  . BARTHOLIN CYST MARSUPIALIZATION    . BARTHOLIN CYST MARSUPIALIZATION Right 08/19/2014   Procedure: BARTHOLIN CYST MARSUPIALIZATION;  Surgeon: Megan Salon, MD;  Location: [facility];  Service: Gynecology;  Laterality: Right;  . CARPAL TUNNEL RELEASE Right 12/20/2012   Procedure: CARPAL TUNNEL RELEASE;  Surgeon: Wynonia Sours, MD;  Location: [place];  Service: Orthopedics;  Laterality: Right;  . CARPOMETACARPEL SUSPENSION PLASTY Right 05/29/2019   Procedure: CARPOMETACARPEL [facility]) SUSPENSION PLASTY excision trapexzium tendon transfer, microlink suspection plasty;  Surgeon: Daryll Brod, MD;  Location: [place];  Service: Orthopedics;  Laterality: Right;  axillary block  . CATARACT EXTRACTION Right 2009      . [place]   x2  . EAR CYST EXCISION Left 08/15/2014   Procedure: LEFT THUMB DEBRIDEMENT INTERPHALANGEAL JOINT LEFT THUMB REMOVAL MUCOID CYST ;  Surgeon: Daryll Brod, MD;  Location: [place];  Service: Orthopedics;  Laterality: Left;  . FINGER SURGERY  2002   lt long finger mass  . GANGLION CYST EXCISION Left 2/02  . TOTAL ABDOMINAL HYSTERECTOMY W/ BILATERAL SALPINGOOPHORECTOMY  1997  . TUBAL LIGATION  1977    Current Outpatient Medications  Medication Sig Dispense Refill  . acetaminophen (TYLENOL) 500 MG tablet Take 500 mg by mouth every 4 (four) hours as needed for mild pain.    . APPLE CIDER VINEGAR PO Take by mouth.    . calcium carbonate (OS-CAL) 600 MG TABS tablet Take 600 mg by mouth 2 (two) times daily with a meal.    . calcium carbonate (TUMS - DOSED IN MG ELEMENTAL CALCIUM) 500 MG chewable tablet Chew 1 tablet by mouth as needed for indigestion or heartburn.    . cholecalciferol (VITAMIN D3) 25 MCG (1000 UNIT) tablet Take 1,000 Units by mouth.    . famotidine (PEPCID) 20 MG tablet Take 20 mg by mouth 2 (two) times daily.    . fish oil-omega-3 fatty acids 1000 MG capsule Take 2 g by mouth daily.    . Flaxseed, Linseed, (FLAX SEED OIL PO) Take 1 capsule by mouth daily.     Marland Kitchen  ibuprofen (ADVIL,MOTRIN) 200 MG tablet Take 200 mg by mouth every 6 (six) hours as needed for moderate pain.    . Multiple Vitamins-Minerals (MULTIVITAMIN WITH MINERALS) tablet Take 1 tablet by mouth daily.    . naproxen sodium (ANAPROX) 220 MG tablet Take 220 mg by mouth daily.     . NONFORMULARY OR COMPOUNDED ITEM Estradiol cream 0.02% in 74ml prefilled syringes.  51ml pv twice weekly.  #81month supply. 1 each 3  . Plant Sterols and Stanols (CHOLESTOFF PO) Take by mouth daily.     . vitamin C (ASCORBIC ACID) 500 MG tablet Take 500 mg by mouth daily.     No current facility-administered medications for this visit.    Family History  Problem Relation Age of Onset  . Heart disease Mother   .  Hyperlipidemia Mother   . Thyroid disease Mother   . Osteoporosis Mother   . Dementia Mother   . Breast cancer Mother 22  . Diabetes Father   . Heart disease Father   . Cancer Father        prostate  . Cancer Brother        Eye  . Cancer Brother        gleoblastoma    Review of Systems  All other systems reviewed and are negative.   Exam:   BP (!) 142/76   Pulse (!) 58   Ht 5\' 5"  (1.651 m)   Wt 148 lb (67.1 kg)   SpO2 100%   BMI 24.63 kg/m   Height: 5\' 5"  (165.1 cm)  General appearance: alert, cooperative and appears stated age Breasts: normal appearance, no masses or tenderness Abdomen: soft, non-tender; bowel sounds normal; no masses,  no organomegaly Lymph nodes: Cervical, supraclavicular, and axillary nodes normal.  No abnormal inguinal nodes palpated Neurologic: Grossly normal  Pelvic: External genitalia:  no lesions              Urethra:  normal appearing urethra with no masses, tenderness or lesions              Bartholins and Skenes: normal                 Vagina: normal appearing vagina with normal color and discharge, no lesions              Cervix: absent              Pap taken: No. Bimanual Exam:  Uterus:  uterus absent              Adnexa: no mass, fullness, tenderness               Rectovaginal: Confirms               Anus:  normal sphincter tone, no lesions  Chaperone, Britt Bottom, CMA, was present for exam.  Assessment/Plan: 1. Encntr for gyn exam (general) (routine) w abnormal findings - pap not indicated - MMG 12/2019 - colonoscopy 2017 - BMD 2020 - vaccines UTD - Dr. Harrington Challenger will do blood work next week - pt desires to comes yearly  2. Bartholin's gland abscess  3. Heterozygous factor V Leiden mutation ([place])  4. Osteopenia, unspecified location  5.  Recurrent UTI - has not had a UTI since starting peri urethral estrogen cream  6. H/O total vaginal hysterectomy  23 minutes of total time was spent for this patient encounter,  including preparation, face-to-face counseling with the patient and coordination of care, and documentation of  the encounter.

## 2020-07-17 ENCOUNTER — Ambulatory Visit: Payer: Medicare Other

## 2020-07-22 DIAGNOSIS — D6851 Activated protein C resistance: Secondary | ICD-10-CM | POA: Diagnosis not present

## 2020-07-22 DIAGNOSIS — Z131 Encounter for screening for diabetes mellitus: Secondary | ICD-10-CM | POA: Diagnosis not present

## 2020-07-22 DIAGNOSIS — D6869 Other thrombophilia: Secondary | ICD-10-CM | POA: Diagnosis not present

## 2020-07-22 DIAGNOSIS — K219 Gastro-esophageal reflux disease without esophagitis: Secondary | ICD-10-CM | POA: Diagnosis not present

## 2020-07-22 DIAGNOSIS — E7800 Pure hypercholesterolemia, unspecified: Secondary | ICD-10-CM | POA: Diagnosis not present

## 2020-07-22 DIAGNOSIS — Z0000 Encounter for general adult medical examination without abnormal findings: Secondary | ICD-10-CM | POA: Diagnosis not present

## 2020-08-20 DIAGNOSIS — M180 Bilateral primary osteoarthritis of first carpometacarpal joints: Secondary | ICD-10-CM | POA: Diagnosis not present

## 2020-09-22 DIAGNOSIS — R3914 Feeling of incomplete bladder emptying: Secondary | ICD-10-CM | POA: Diagnosis not present

## 2020-09-22 DIAGNOSIS — N3000 Acute cystitis without hematuria: Secondary | ICD-10-CM | POA: Diagnosis not present

## 2020-11-11 ENCOUNTER — Other Ambulatory Visit: Payer: Self-pay | Admitting: Obstetrics & Gynecology

## 2020-11-11 DIAGNOSIS — Z1231 Encounter for screening mammogram for malignant neoplasm of breast: Secondary | ICD-10-CM

## 2020-11-17 DIAGNOSIS — Z23 Encounter for immunization: Secondary | ICD-10-CM | POA: Diagnosis not present

## 2020-12-11 ENCOUNTER — Other Ambulatory Visit (HOSPITAL_BASED_OUTPATIENT_CLINIC_OR_DEPARTMENT_OTHER): Payer: Self-pay

## 2020-12-23 DIAGNOSIS — D3132 Benign neoplasm of left choroid: Secondary | ICD-10-CM | POA: Diagnosis not present

## 2020-12-23 DIAGNOSIS — H40013 Open angle with borderline findings, low risk, bilateral: Secondary | ICD-10-CM | POA: Diagnosis not present

## 2020-12-23 DIAGNOSIS — H35362 Drusen (degenerative) of macula, left eye: Secondary | ICD-10-CM | POA: Diagnosis not present

## 2020-12-23 DIAGNOSIS — Z961 Presence of intraocular lens: Secondary | ICD-10-CM | POA: Diagnosis not present

## 2021-01-02 ENCOUNTER — Ambulatory Visit
Admission: RE | Admit: 2021-01-02 | Discharge: 2021-01-02 | Disposition: A | Discharge status: Home / Self Care | Payer: Medicare Other | Source: Ambulatory Visit | Attending: Obstetrics & Gynecology | Admitting: Obstetrics & Gynecology

## 2021-01-02 ENCOUNTER — Other Ambulatory Visit: Payer: Self-pay

## 2021-01-02 DIAGNOSIS — Z1231 Encounter for screening mammogram for malignant neoplasm of breast: Secondary | ICD-10-CM | POA: Diagnosis not present

## 2021-01-06 ENCOUNTER — Encounter (INDEPENDENT_AMBULATORY_CARE_PROVIDER_SITE_OTHER): Payer: Medicare Other | Admitting: Ophthalmology

## 2021-01-07 ENCOUNTER — Encounter (INDEPENDENT_AMBULATORY_CARE_PROVIDER_SITE_OTHER): Payer: Medicare Other | Admitting: Ophthalmology

## 2021-01-09 NOTE — Progress Notes (Signed)
[facility] Note  01/13/2021     CHIEF COMPLAINT Patient presents for Retina Evaluation   HISTORY OF PRESENT ILLNESS: [name] is a 75 y.o. female who presents to the clinic today for:   HPI     Retina Evaluation   In both eyes.  I, the attending physician,  performed the HPI with the patient and updated documentation appropriately.        Comments   Patient here for Retina Evaluation. Referred by Dr Kathlen Mody. Patient states vision doing fine. No eye pain. Dr Kathlen Mody saw a nevus. Patient saw Dr Baird Cancer a year ago. Dr Kathlen Mody wanted another opinion. Patient can not use floris for getting pressure in eye.      Last edited by Bernarda Caffey, MD on 01/13/2021 12:20 PM.    Pt is here on the referral of Dr. Kathlen Mody for concern of choroidal nevus OS, pt states she does not have any visual symptoms or pain, pt saw Dr. Baird Cancer previously who did not think there was any cause for concern, but Dr. Kathlen Mody wanted a second opinion, pt states the lesion has never shown up in previous eye pictures, pt denies any hx of cancer in herself, but states she had one brother die of ocular melanoma that metastasized and another brother died of a brain tumor, pt has had cataract sx with Dr. Herbert Deaner, pt states the right eye was a complicated sx and that her original referral to him was in regards to that sx   Referring physician: Hortencia Pilar, MD [place],  [place] [id number]  HISTORICAL INFORMATION:   Selected notes from the MEDICAL RECORD NUMBER Referred by Dr. 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 (Ophthalmic Drugs)   No current facility-administered medications for this visit. (Ophthalmic Drugs)   Current Outpatient Medications (Other)  Medication Sig   acetaminophen (TYLENOL) 500 MG tablet Take 500 mg by mouth every 4 (four) hours as needed for mild pain.   APPLE CIDER  VINEGAR PO Take by mouth.   calcium carbonate (OS-CAL) 600 MG TABS tablet Take 600 mg by mouth 2 (two) times daily with a meal.   calcium carbonate (TUMS - DOSED IN MG ELEMENTAL CALCIUM) 500 MG chewable tablet Chew 1 tablet by mouth as needed for indigestion or heartburn.   cholecalciferol (VITAMIN D3) 25 MCG (1000 UNIT) tablet Take 1,000 Units by mouth.   famotidine (PEPCID) 20 MG tablet Take 20 mg by mouth 2 (two) times daily.   fish oil-omega-3 fatty acids 1000 MG capsule Take 2 g by mouth daily.   Flaxseed, Linseed, (FLAX SEED OIL PO) Take 1 capsule by mouth daily.    ibuprofen (ADVIL,MOTRIN) 200 MG tablet Take 200 mg by mouth every 6 (six) hours as needed for moderate pain.   Multiple Vitamins-Minerals (MULTIVITAMIN WITH MINERALS) tablet Take 1 tablet by mouth daily.   naproxen sodium (ANAPROX) 220 MG tablet Take 220 mg by mouth daily.    NONFORMULARY OR COMPOUNDED ITEM Estradiol cream 0.02% in 79m prefilled syringes.  157mpv twice weekly.  #51m84monthpply.   Plant Sterols and Stanols (CHOLESTOFF PO) Take by mouth daily.    vitamin C (ASCORBIC ACID) 500 MG tablet Take 500 mg by mouth daily.   No current facility-administered medications for this visit. (Other)      REVIEW OF SYSTEMS: ROS   Positive for: Eyes Last edited by  Theodore Demark, COA on 01/13/2021  9:07 AM.       ALLERGIES Allergies  Allergen Reactions   Adhesive [Tape]     Tapes cause rash/blister, need to use paper tape    Ciprofloxacin    Indomethacin Other (See Comments)    Felt chest pain, possible esophageal spasm   Ofloxacin Itching and Swelling    PAST MEDICAL HISTORY Past Medical History:  Diagnosis Date   Contact lens/glasses fitting    wears one contact lt eye   Heterozygous factor V Leiden mutation ([place])    Osteopenia    PONV (postoperative nausea and vomiting)    Superficial vein thrombosis 1997   Varicose veins of both lower extremities    Past Surgical History:  Procedure Laterality Date    BARTHOLIN CYST MARSUPIALIZATION     BARTHOLIN CYST MARSUPIALIZATION Right 08/19/2014   Procedure: BARTHOLIN CYST MARSUPIALIZATION;  Surgeon: Megan Salon, MD;  Location: [facility];  Service: Gynecology;  Laterality: Right;   CARPAL TUNNEL RELEASE Right 12/20/2012   Procedure: CARPAL TUNNEL RELEASE;  Surgeon: Wynonia Sours, MD;  Location: [place];  Service: Orthopedics;  Laterality: Right;   CARPOMETACARPEL SUSPENSION PLASTY Right 05/29/2019   Procedure: CARPOMETACARPEL [facility]) SUSPENSION PLASTY excision trapexzium tendon transfer, microlink suspection plasty;  Surgeon: Daryll Brod, MD;  Location: [place];  Service: Orthopedics;  Laterality: Right;  axillary block   CATARACT EXTRACTION Right 2009       CESAREAN SECTION  1973, 1977   x2   EAR CYST EXCISION Left 08/15/2014   Procedure: LEFT THUMB DEBRIDEMENT INTERPHALANGEAL JOINT LEFT THUMB REMOVAL MUCOID CYST ;  Surgeon: Daryll Brod, MD;  Location: [place];  Service: Orthopedics;  Laterality: Left;   FINGER SURGERY  2002   lt long finger mass   GANGLION CYST EXCISION Left 2/02   TOTAL ABDOMINAL HYSTERECTOMY W/ BILATERAL SALPINGOOPHORECTOMY  1997   TUBAL LIGATION  1977    FAMILY HISTORY Family History  Problem Relation Age of Onset   Heart disease Mother    Hyperlipidemia Mother    Thyroid disease Mother    Osteoporosis Mother    Dementia Mother    Breast cancer Mother 56   Diabetes Father    Heart disease Father    Cancer Father        prostate   Cancer Brother        Eye   Cancer Brother        gleoblastoma    SOCIAL HISTORY Social History   Tobacco Use   Smoking status: Never   Smokeless tobacco: Never  Vaping Use   Vaping Use: Never used  Substance Use Topics   Alcohol use: Yes    Comment: glass of wine weeklu   Drug use: No         OPHTHALMIC EXAM:  Base Eye Exam     Visual Acuity (Snellen - Linear)       Right Left   Dist cc 20/25 -1 20/20   Dist ph cc  20/20 -1     Correction: Glasses         Tonometry (Tonopen, 9:02 AM)       Right Left   Pressure 14 12  Patient can not use floris.        Pupils       Dark Light Shape React APD   Right 2 1 Round Brisk None   Left 2 1 Round Brisk None  Visual Fields (Counting fingers)       Left Right    Full Full         Extraocular Movement       Right Left    Full, Ortho Full, Ortho         Neuro/Psych     Oriented x3: Yes   Mood/Affect: Normal         Dilation     Both eyes: 1.0% Mydriacyl, 2.5% Phenylephrine @ 9:02 AM           Slit Lamp and Fundus Exam     Slit Lamp Exam       Right Left   Lids/Lashes Dermatochalasis - upper lid, mild MGD Dermatochalasis - upper lid, mild MGD   Conjunctiva/Sclera White and quiet White and quiet   Cornea mild arcus, 1+ Punctate epithelial erosions, well healed cataract wound mild arcus, 1+ Punctate epithelial erosions, well healed cataract wound   Anterior Chamber Deep and quiet Deep and quiet   Iris Round and moderately dilated Round and moderately dilated   Lens PC IOL in good position PC IOL in good position, 1+ non-central Posterior capsular opacification   Vitreous Vitreous syneresis Vitreous syneresis         Fundus Exam       Right Left   Disc Pink and Sharp Pink and Sharp   C/D Ratio 0.4 0.4   Macula Flat, Blunted foveal reflex, mild RPE mottling, No heme or edema Flat, Blunted foveal reflex, RPE mottling and clumping, No heme or edema   Vessels attenuated, mild tortuousity attenuated, mild tortuousity   Periphery Attached    Attached, amelanotic nevus with overlying pigment clumping and shallow SRF, no orange pigment, approx 4x5 DD -- just outside distal ST arcades            Refraction     Wearing Rx       Sphere Cylinder Axis Add   Right -0.50 +0.75 050 +2.00   Left -0.25 +0.25 032 +2.00            IMAGING AND PROCEDURES  Imaging and Procedures for 01/13/2021  OCT, Retina -  OU - Both Eyes       Right Eye Quality was good. Central Foveal Thickness: 274. Progression has no prior data. Findings include normal foveal contour, no IRF, no SRF.   Left Eye Quality was good. Central Foveal Thickness: 271. Progression has no prior data. Findings include normal foveal contour, no IRF, subretinal fluid.   Notes *Images captured and stored on drive  Diagnosis / Impression:  OD: NFP, no IRF/SRF  OS: mildly elevated hyper reflective choroidal lesion with overlying drusen, PED and shallow SRF  Clinical management:  See below  Abbreviations: NFP - Normal foveal profile. CME - cystoid macular edema. PED - pigment epithelial detachment. IRF - intraretinal fluid. SRF - subretinal fluid. EZ - ellipsoid zone. ERM - epiretinal membrane. ORA - outer retinal atrophy. ORT - outer retinal tubulation. SRHM - subretinal hyper-reflective material. IRHM - intraretinal hyper-reflective material      Fluorescein Angiography Optos (Transit OS)       Right Eye Progression has no prior data. Early phase findings include normal observations. Mid/Late phase findings include normal observations.   Left Eye Progression has no prior data. Early phase findings include window defect, staining, vascular perfusion defect. Mid/Late phase findings include staining, window defect (Focal staining / window defect of choroidal lesion just outside ST arcades).   Notes **Images stored on  drive**  Impression: OD: normal study OS: choroidal lesion with early blockage and late staining just outside ST arcades              ASSESSMENT/PLAN:    ICD-10-CM   1. 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 Retinal edema  H35.81 OCT, Retina - OU - Both Eyes    3. Pseudophakia  Z96.1     4. Hypertensive retinopathy of both eyes  H35.033 Fluorescein Angiography Optos (Transit OS)     1,2. Choroidal Nevus, OS  - mostly amelanotic choroidal lesion just  outside distal ST arcades ~4x5DD in size  - no visual symptoms with BCVA 20/20 OS  - +pigment clumping and drusen, +SRF, no orange pigment  - thickness < 75m  - pt reports brother with history of choroidal melanoma, s/p enucleation, who died ~10 yrs after enucleation from liver mets  - OCT shows low-lying hyper reflective choroidal lesion with overlying drusen, PED and shallow SRF  - FA (09.06.22) shows choroidal lesion with early blockage and late staining just outside ST arcades  - discussed findings, prognosis, and treatment options  - [facility]  - f/u here in 3-4 months, DFE, OCT  3. Pseudophakia OU  - s/p CE/IOL (Dr. HHerbert Deaner  - IOLs in good position, doing well  - monitor  Ophthalmic Meds Ordered this visit:  No orders of the defined types were placed in this encounter.     Return for f/u 3-4 months, choroidal nevus OS, DFE, OCT.  There are no Patient Instructions on file for this visit.  Explained the diagnoses, plan, and follow up with the patient and they expressed understanding.  Patient expressed understanding of the importance of proper follow up care.   This document serves as a record of services personally performed by BGardiner Sleeper MD, PhD. It was created on their behalf by AEstill Bakes COT an ophthalmic technician. The creation of this record is the provider's dictation and/or activities during the visit.    Electronically signed by: AEstill Bakes COT 9.2.22 @ 12:31 PM   BGardiner Sleeper M.D., Ph.D. Diseases & Surgery of the Retina and Vitreous [place] I have reviewed the above documentation for accuracy and completeness, and I agree with the above. BGardiner Sleeper M.D., Ph.D. 01/13/21 12:31 PM   Abbreviations: M myopia (nearsighted); A astigmatism; H hyperopia (farsighted); P presbyopia; Mrx spectacle prescription;  CTL contact lenses; OD right eye; OS left eye;  OU both eyes  XT exotropia; ET esotropia; PEK punctate epithelial keratitis; PEE punctate epithelial erosions; DES dry eye syndrome; MGD meibomian gland dysfunction; ATs artificial tears; PFAT's preservative free artificial tears; NHanaleinuclear sclerotic cataract; PSC posterior subcapsular cataract; ERM epi-retinal membrane; PVD posterior vitreous detachment; RD retinal detachment; DM diabetes mellitus; DR diabetic retinopathy; NPDR non-proliferative diabetic retinopathy; PDR proliferative diabetic retinopathy; CSME clinically significant macular edema; DME diabetic macular edema; dbh dot blot hemorrhages; CWS cotton wool spot; POAG primary open angle glaucoma; C/D cup-to-disc ratio; HVF humphrey visual field; GVF goldmann visual field; OCT optical coherence tomography; IOP intraocular pressure; BRVO Branch retinal vein occlusion; CRVO central retinal vein occlusion; CRAO central retinal artery occlusion; BRAO branch retinal artery occlusion; RT retinal tear; SB scleral buckle; PPV pars plana vitrectomy; VH Vitreous hemorrhage; PRP panretinal laser photocoagulation; IVK intravitreal kenalog; VMT vitreomacular traction; MH Macular hole;  NVD neovascularization of the  disc; NVE neovascularization elsewhere; AREDS age related eye disease study; ARMD age related macular degeneration; POAG primary open angle glaucoma; EBMD epithelial/anterior basement membrane dystrophy; ACIOL anterior chamber intraocular lens; IOL intraocular lens; PCIOL posterior chamber intraocular lens; Phaco/IOL phacoemulsification with intraocular lens placement; [place] photorefractive keratectomy; LASIK laser assisted in situ keratomileusis; HTN hypertension; DM diabetes mellitus; COPD chronic obstructive pulmonary disease

## 2021-01-13 ENCOUNTER — Ambulatory Visit (INDEPENDENT_AMBULATORY_CARE_PROVIDER_SITE_OTHER): Payer: Medicare Other | Admitting: Ophthalmology

## 2021-01-13 ENCOUNTER — Other Ambulatory Visit: Payer: Self-pay

## 2021-01-13 ENCOUNTER — Encounter (INDEPENDENT_AMBULATORY_CARE_PROVIDER_SITE_OTHER): Payer: Self-pay | Admitting: Ophthalmology

## 2021-01-13 DIAGNOSIS — Z961 Presence of intraocular lens: Secondary | ICD-10-CM | POA: Diagnosis not present

## 2021-01-13 DIAGNOSIS — H3581 Retinal edema: Secondary | ICD-10-CM | POA: Diagnosis not present

## 2021-01-13 DIAGNOSIS — H35033 Hypertensive retinopathy, bilateral: Secondary | ICD-10-CM

## 2021-01-13 DIAGNOSIS — D3132 Benign neoplasm of left choroid: Secondary | ICD-10-CM | POA: Diagnosis not present

## 2021-01-26 DIAGNOSIS — D3132 Benign neoplasm of left choroid: Secondary | ICD-10-CM | POA: Insufficient documentation

## 2021-01-27 DIAGNOSIS — Z83518 Family history of other specified eye disorder: Secondary | ICD-10-CM | POA: Diagnosis not present

## 2021-01-27 DIAGNOSIS — Z808 Family history of malignant neoplasm of other organs or systems: Secondary | ICD-10-CM | POA: Diagnosis not present

## 2021-01-27 DIAGNOSIS — D3132 Benign neoplasm of left choroid: Secondary | ICD-10-CM | POA: Diagnosis not present

## 2021-01-27 DIAGNOSIS — Z961 Presence of intraocular lens: Secondary | ICD-10-CM | POA: Diagnosis not present

## 2021-02-11 DIAGNOSIS — R6889 Other general symptoms and signs: Secondary | ICD-10-CM | POA: Diagnosis not present

## 2021-02-11 DIAGNOSIS — J029 Acute pharyngitis, unspecified: Secondary | ICD-10-CM | POA: Diagnosis not present

## 2021-02-18 DIAGNOSIS — G5602 Carpal tunnel syndrome, left upper limb: Secondary | ICD-10-CM | POA: Diagnosis not present

## 2021-03-06 ENCOUNTER — Other Ambulatory Visit (HOSPITAL_COMMUNITY): Payer: Self-pay | Admitting: Family Medicine

## 2021-03-06 ENCOUNTER — Other Ambulatory Visit: Payer: Self-pay

## 2021-03-06 ENCOUNTER — Ambulatory Visit (HOSPITAL_COMMUNITY)
Admission: RE | Admit: 2021-03-06 | Discharge: 2021-03-06 | Disposition: A | Discharge status: Home / Self Care | Payer: Medicare Other | Source: Ambulatory Visit | Attending: Family Medicine | Admitting: Family Medicine

## 2021-03-06 DIAGNOSIS — R52 Pain, unspecified: Secondary | ICD-10-CM

## 2021-03-06 DIAGNOSIS — M79604 Pain in right leg: Secondary | ICD-10-CM | POA: Diagnosis not present

## 2021-03-06 DIAGNOSIS — D6851 Activated protein C resistance: Secondary | ICD-10-CM | POA: Diagnosis not present

## 2021-03-11 DIAGNOSIS — I8001 Phlebitis and thrombophlebitis of superficial vessels of right lower extremity: Secondary | ICD-10-CM | POA: Diagnosis not present

## 2021-03-27 DIAGNOSIS — Z23 Encounter for immunization: Secondary | ICD-10-CM | POA: Diagnosis not present

## 2021-03-31 DIAGNOSIS — R350 Frequency of micturition: Secondary | ICD-10-CM | POA: Diagnosis not present

## 2021-04-13 ENCOUNTER — Other Ambulatory Visit: Payer: Self-pay

## 2021-04-13 ENCOUNTER — Other Ambulatory Visit (HOSPITAL_COMMUNITY): Payer: Self-pay | Admitting: Family Medicine

## 2021-04-13 ENCOUNTER — Ambulatory Visit (HOSPITAL_COMMUNITY)
Admission: RE | Admit: 2021-04-13 | Discharge: 2021-04-13 | Disposition: A | Discharge status: Home / Self Care | Payer: Medicare Other | Source: Ambulatory Visit | Attending: Family Medicine | Admitting: Family Medicine

## 2021-04-13 DIAGNOSIS — I82890 Acute embolism and thrombosis of other specified veins: Secondary | ICD-10-CM | POA: Insufficient documentation

## 2021-04-13 DIAGNOSIS — I82819 Embolism and thrombosis of superficial veins of unspecified lower extremities: Secondary | ICD-10-CM

## 2021-04-14 DIAGNOSIS — M25511 Pain in right shoulder: Secondary | ICD-10-CM | POA: Diagnosis not present

## 2021-04-20 DIAGNOSIS — U071 COVID-19: Secondary | ICD-10-CM | POA: Diagnosis not present

## 2021-04-28 DIAGNOSIS — R293 Abnormal posture: Secondary | ICD-10-CM | POA: Diagnosis not present

## 2021-04-28 DIAGNOSIS — M25512 Pain in left shoulder: Secondary | ICD-10-CM | POA: Diagnosis not present

## 2021-04-30 DIAGNOSIS — R293 Abnormal posture: Secondary | ICD-10-CM | POA: Diagnosis not present

## 2021-04-30 DIAGNOSIS — M25512 Pain in left shoulder: Secondary | ICD-10-CM | POA: Diagnosis not present

## 2021-05-06 ENCOUNTER — Other Ambulatory Visit: Payer: Self-pay

## 2021-05-06 ENCOUNTER — Ambulatory Visit
Admission: RE | Admit: 2021-05-06 | Discharge: 2021-05-06 | Disposition: A | Discharge status: Home / Self Care | Payer: Medicare Other | Source: Ambulatory Visit | Attending: Sports Medicine | Admitting: Sports Medicine

## 2021-05-06 ENCOUNTER — Other Ambulatory Visit: Payer: Self-pay | Admitting: Sports Medicine

## 2021-05-06 DIAGNOSIS — M25512 Pain in left shoulder: Secondary | ICD-10-CM

## 2021-05-07 DIAGNOSIS — M25512 Pain in left shoulder: Secondary | ICD-10-CM | POA: Diagnosis not present

## 2021-05-07 DIAGNOSIS — R293 Abnormal posture: Secondary | ICD-10-CM | POA: Diagnosis not present

## 2021-05-07 NOTE — Progress Notes (Signed)
[facility] Note  05/15/2021     CHIEF COMPLAINT Patient presents for Retina Follow Up  HISTORY OF PRESENT ILLNESS: [name] is a 75 y.o. female who presents to the clinic today for:   HPI     Retina Follow Up   Patient presents with  Other.  In left eye.  Severity is mild.  Duration of 4 months.  I, the attending physician,  performed the HPI with the patient and updated documentation appropriately.        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 Patient states no vision changes. Patient started a new med for blood clots in leg. Xarelto 15mg  2 x a day      Last edited by Bernarda Caffey, MD on 05/17/2021  4:31 PM.      Referring physician: Monna Fam, MD [place],  [place] [id number]  HISTORICAL INFORMATION:   Selected notes from the MEDICAL RECORD NUMBER Referred by Dr. 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 (Ophthalmic Drugs)   No current facility-administered medications for this visit. (Ophthalmic Drugs)   Current Outpatient Medications (Other)  Medication Sig   acetaminophen (TYLENOL) 500 MG tablet Take 500 mg by mouth every 4 (four) hours as needed for mild pain.   cholecalciferol (VITAMIN D3) 25 MCG (1000 UNIT) tablet Take 1,000 Units by mouth.   famotidine (PEPCID) 20 MG tablet Take 20 mg by mouth 2 (two) times daily.   ibuprofen (ADVIL,MOTRIN) 200 MG tablet Take 200 mg by mouth every 6 (six) hours as needed for moderate pain.   naproxen sodium (ANAPROX) 220 MG tablet Take 220 mg by mouth daily.    NONFORMULARY OR COMPOUNDED ITEM Estradiol cream 0.02% in 67ml prefilled syringes.  80ml pv twice weekly.  #18month supply.   Plant Sterols and Stanols (CHOLESTOFF PO) Take by mouth daily.    Rivaroxaban (XARELTO) 15 MG TABS tablet Take 15 mg by mouth 2 (two) times daily with a meal.   APPLE CIDER VINEGAR PO Take by mouth. (Patient  not taking: Reported on 05/15/2021)   calcium carbonate (OS-CAL) 600 MG TABS tablet Take 600 mg by mouth 2 (two) times daily with a meal. (Patient not taking: Reported on 05/15/2021)   calcium carbonate (TUMS - DOSED IN MG ELEMENTAL CALCIUM) 500 MG chewable tablet Chew 1 tablet by mouth as needed for indigestion or heartburn. (Patient not taking: Reported on 05/15/2021)   fish oil-omega-3 fatty acids 1000 MG capsule Take 2 g by mouth daily. (Patient not taking: Reported on 05/15/2021)   Flaxseed, Linseed, (FLAX SEED OIL PO) Take 1 capsule by mouth daily.  (Patient not taking: Reported on 05/15/2021)   Multiple Vitamins-Minerals (MULTIVITAMIN WITH MINERALS) tablet Take 1 tablet by mouth daily. (Patient not taking: Reported on 05/15/2021)   vitamin C (ASCORBIC ACID) 500 MG tablet Take 500 mg by mouth daily. (Patient not taking: Reported on 05/15/2021)   No current facility-administered medications for this visit. (Other)      REVIEW OF SYSTEMS: ROS   Positive for: Musculoskeletal, Eyes, Respiratory Negative for: Constitutional, Gastrointestinal, Neurological, Skin, Genitourinary, HENT, Endocrine, Cardiovascular, Psychiatric, Allergic/Imm, Heme/Lymph Last edited by Matthew Folks, COA on 05/15/2021 10:35 AM.     ALLERGIES Allergies  Allergen Reactions   Adhesive [Tape]     Tapes cause rash/blister, need to use paper tape    Ciprofloxacin  Indomethacin Other (See Comments)    Felt chest pain, possible esophageal spasm   Ofloxacin Itching and Swelling   PAST MEDICAL HISTORY Past Medical History:  Diagnosis Date   Contact lens/glasses fitting    wears one contact lt eye   Heterozygous factor V Leiden mutation ([place])    Osteopenia    PONV (postoperative nausea and vomiting)    Superficial vein thrombosis 1997   Varicose veins of both lower extremities    Past Surgical History:  Procedure Laterality Date   BARTHOLIN CYST MARSUPIALIZATION     BARTHOLIN CYST MARSUPIALIZATION Right 08/19/2014    Procedure: BARTHOLIN CYST MARSUPIALIZATION;  Surgeon: Megan Salon, MD;  Location: [facility];  Service: Gynecology;  Laterality: Right;   CARPAL TUNNEL RELEASE Right 12/20/2012   Procedure: CARPAL TUNNEL RELEASE;  Surgeon: Wynonia Sours, MD;  Location: [facility];  Service: Orthopedics;  Laterality: Right;   CARPOMETACARPEL SUSPENSION PLASTY Right 05/29/2019   Procedure: CARPOMETACARPEL [facility]) SUSPENSION PLASTY excision trapexzium tendon transfer, microlink suspection plasty;  Surgeon: Daryll Brod, MD;  Location: [facility];  Service: Orthopedics;  Laterality: Right;  axillary block   CATARACT EXTRACTION Right 2009       CESAREAN SECTION  1973, 1977   x2   EAR CYST EXCISION Left 08/15/2014   Procedure: LEFT THUMB DEBRIDEMENT INTERPHALANGEAL JOINT LEFT THUMB REMOVAL MUCOID CYST ;  Surgeon: Daryll Brod, MD;  Location: [facility];  Service: Orthopedics;  Laterality: Left;   FINGER SURGERY  2002   lt long finger mass   GANGLION CYST EXCISION Left 2/02   TOTAL ABDOMINAL HYSTERECTOMY W/ BILATERAL SALPINGOOPHORECTOMY  1997   TUBAL LIGATION  1977   FAMILY HISTORY Family History  Problem Relation Age of Onset   Heart disease Mother    Hyperlipidemia Mother    Thyroid disease Mother    Osteoporosis Mother    Dementia Mother    Breast cancer Mother 66   Diabetes Father    Heart disease Father    Cancer Father        prostate   Cancer Brother        Eye   Cancer Brother        gleoblastoma   SOCIAL HISTORY Social History   Tobacco Use   Smoking status: Never   Smokeless tobacco: Never  Vaping Use   Vaping Use: Never used  Substance Use Topics   Alcohol use: Yes    Comment: glass of wine weeklu   Drug use: No       OPHTHALMIC EXAM:  Base Eye Exam     Visual Acuity (Snellen - Linear)       Right Left   Dist cc 20/20-1 20/20    Correction: Glasses         Tonometry (Tonopen, 9:36 AM)       Right Left   Pressure 15 12          Pupils       Dark Light Shape React APD   Right 3 2 Round Brisk None   Left 3 2 Round Brisk None         Visual Fields (Counting fingers)       Left Right    Full Full         Extraocular Movement       Right Left    Full, Ortho Full, Ortho         Neuro/Psych     Oriented x3:  Yes   Mood/Affect: Normal         Dilation     Both eyes: 1.0% Mydriacyl, 2.5% Phenylephrine @ 9:35 AM           Slit Lamp and Fundus Exam     Slit Lamp Exam       Right Left   Lids/Lashes Dermatochalasis - upper lid, mild MGD Dermatochalasis - upper lid, mild MGD   Conjunctiva/Sclera White and quiet White and quiet   Cornea mild arcus, 1+ Punctate epithelial erosions, well healed cataract wound, mild tear film debris mild arcus, 1+ Punctate epithelial erosions, well healed cataract wound, mild tear film debris   Anterior Chamber Deep and quiet Deep and quiet   Iris Round and moderately dilated Round and moderately dilated   Lens PC IOL in good position PC IOL in good position, 1+ non-central Posterior capsular opacification   Anterior Vitreous Vitreous syneresis Vitreous syneresis         Fundus Exam       Right Left   Disc Pink and Sharp Pink and Sharp   C/D Ratio 0.4 0.4   Macula Flat, Blunted foveal reflex, mild RPE mottling, No heme or edema Flat, Blunted foveal reflex, RPE mottling and clumping, No heme or edema   Vessels attenuated, mild tortuousity attenuated, mild tortuousity   Periphery Attached, no heme Attached, amelanotic nevus with overlying pigment clumping and shallow SRF--improved, no orange pigment, approx 4x5 DD -- just outside distal ST arcades           Refraction     Wearing Rx       Sphere Cylinder Axis Add   Right -0.50 +0.75 050 +2.00   Left -0.25 +0.25 032 +2.00            IMAGING AND PROCEDURES  Imaging and Procedures for 05/15/2021  OCT, Retina - OU - Both Eyes       Right Eye Quality was good. Central Foveal Thickness: 273.  Progression has been stable. Findings include normal foveal contour, no IRF, no SRF.   Left Eye Quality was good. Central Foveal Thickness: 268. Progression has improved. Findings include normal foveal contour, no IRF, subretinal fluid, pigment epithelial detachment (Mildly elevated hyper reflective choroidal lesion with overlying drusen, PED and shallow SRF improved).   Notes *Images captured and stored on drive  Diagnosis / Impression:  OD: NFP, no IRF/SRF  OS: Mildly elevated hyper reflective choroidal lesion with overlying drusen, PED and shallow SRF--interval improvement  Clinical management:  See below  Abbreviations: NFP - Normal foveal profile. CME - cystoid macular edema. PED - pigment epithelial detachment. IRF - intraretinal fluid. SRF - subretinal fluid. EZ - ellipsoid zone. ERM - epiretinal membrane. ORA - outer retinal atrophy. ORT - outer retinal tubulation. SRHM - subretinal hyper-reflective material. IRHM - intraretinal hyper-reflective material            ASSESSMENT/PLAN:    ICD-10-CM   1. 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 Pseudophakia  Z96.1      1,2. Choroidal Nevus, OS  - mostly amelanotic choroidal lesion just outside distal ST arcades ~4x5DD in size  - no visual symptoms with BCVA 20/20 OS  - +pigment clumping and drusen, +SRF--improved, no orange pigment  - thickness < 68mm  - pt reports brother with history of choroidal melanoma, s/p enucleation, who died ~10 yrs after enucleation from liver mets  - OCT shows mildly elevated hyper  reflective choroidal lesion with overlying drusen, PED and shallow SRF--interval improvement  - FA (09.06.22) shows choroidal lesion with early blockage and late staining just outside ST arcades  - discussed findings, prognosis, and treatment options  - Followed by Dr. Cordelia Pen at [facility] as well who will alternate annual visits with [place] -- f/u in 2024  - f/u here in 9-12 mos w/DFE/OCT/Optos FA  (Transit OS)  3. Pseudophakia OU  - s/p CE/IOL (Dr. Herbert Deaner)  - IOLs in good position, doing well  - monitor   Ophthalmic Meds Ordered this visit:  No orders of the defined types were placed in this encounter.     Return for 9-12 mo f/u for chor nevus OS w/DFE/OCT/Optos FA (Transit OS).  There are no Patient Instructions on file for this visit.  Explained the diagnoses, plan, and follow up with the patient and they expressed understanding.  Patient expressed understanding of the importance of proper follow up care.   This document serves as a record of services personally performed by Gardiner Sleeper, MD, PhD. It was created on their behalf by Estill Bakes, COT an ophthalmic technician. The creation of this record is the provider's dictation and/or activities during the visit.    Electronically signed by: Estill Bakes, COT 1.6.23 @ 4:35 PM   Gardiner Sleeper, M.D., Ph.D. Diseases & Surgery of the Retina and [place] 05/15/2021  I have reviewed the above documentation for accuracy and completeness, and I agree with the above. Gardiner Sleeper, M.D., Ph.D. 05/17/21 4:35 PM   Abbreviations: M myopia (nearsighted); A astigmatism; H hyperopia (farsighted); P presbyopia; Mrx spectacle prescription;  CTL contact lenses; OD right eye; OS left eye; OU both eyes  XT exotropia; ET esotropia; PEK punctate epithelial keratitis; PEE punctate epithelial erosions; DES dry eye syndrome; MGD meibomian gland dysfunction; ATs artificial tears; PFAT's preservative free artificial tears; [place] nuclear sclerotic cataract; PSC posterior subcapsular cataract; ERM epi-retinal membrane; PVD posterior vitreous detachment; RD retinal detachment; DM diabetes mellitus; DR diabetic retinopathy; NPDR non-proliferative diabetic retinopathy; PDR proliferative diabetic retinopathy; CSME clinically significant macular edema; DME diabetic macular edema; dbh dot blot hemorrhages; CWS cotton  wool spot; POAG primary open angle glaucoma; C/D cup-to-disc ratio; HVF humphrey visual field; GVF goldmann visual field; OCT optical coherence tomography; IOP intraocular pressure; BRVO Branch retinal vein occlusion; CRVO central retinal vein occlusion; CRAO central retinal artery occlusion; BRAO branch retinal artery occlusion; RT retinal tear; SB scleral buckle; PPV pars plana vitrectomy; VH Vitreous hemorrhage; PRP panretinal laser photocoagulation; IVK intravitreal kenalog; VMT vitreomacular traction; MH Macular hole;  NVD neovascularization of the disc; NVE neovascularization elsewhere; AREDS age related eye disease study; ARMD age related macular degeneration; POAG primary open angle glaucoma; EBMD epithelial/anterior basement membrane dystrophy; ACIOL anterior chamber intraocular lens; IOL intraocular lens; PCIOL posterior chamber intraocular lens; Phaco/IOL phacoemulsification with intraocular lens placement; [place] photorefractive keratectomy; LASIK laser assisted in situ keratomileusis; HTN hypertension; DM diabetes mellitus; COPD chronic obstructive pulmonary disease

## 2021-05-15 ENCOUNTER — Encounter (INDEPENDENT_AMBULATORY_CARE_PROVIDER_SITE_OTHER): Payer: Self-pay | Admitting: Ophthalmology

## 2021-05-15 ENCOUNTER — Other Ambulatory Visit: Payer: Self-pay

## 2021-05-15 ENCOUNTER — Ambulatory Visit (INDEPENDENT_AMBULATORY_CARE_PROVIDER_SITE_OTHER): Payer: PPO | Admitting: Ophthalmology

## 2021-05-15 DIAGNOSIS — Z961 Presence of intraocular lens: Secondary | ICD-10-CM

## 2021-05-15 DIAGNOSIS — D3132 Benign neoplasm of left choroid: Secondary | ICD-10-CM | POA: Diagnosis not present

## 2021-05-17 ENCOUNTER — Encounter (INDEPENDENT_AMBULATORY_CARE_PROVIDER_SITE_OTHER): Payer: Self-pay | Admitting: Ophthalmology

## 2021-06-12 ENCOUNTER — Ambulatory Visit (HOSPITAL_BASED_OUTPATIENT_CLINIC_OR_DEPARTMENT_OTHER): Payer: PPO | Admitting: Obstetrics & Gynecology

## 2021-06-12 ENCOUNTER — Encounter (HOSPITAL_BASED_OUTPATIENT_CLINIC_OR_DEPARTMENT_OTHER): Payer: Self-pay | Admitting: Obstetrics & Gynecology

## 2021-06-12 ENCOUNTER — Other Ambulatory Visit: Payer: Self-pay

## 2021-06-12 VITALS — BP 143/84 | HR 71 | Ht 65.75 in | Wt 148.6 lb

## 2021-06-12 DIAGNOSIS — R350 Frequency of micturition: Secondary | ICD-10-CM | POA: Diagnosis not present

## 2021-06-12 LAB — POCT URINALYSIS DIPSTICK
Bilirubin, UA: NEGATIVE
Glucose, UA: NEGATIVE
Ketones, UA: NEGATIVE
Nitrite, UA: NEGATIVE
Protein, UA: NEGATIVE
Spec Grav, UA: 1.01 (ref 1.010–1.025)
Urobilinogen, UA: 0.2 E.U./dL
pH, UA: 6 (ref 5.0–8.0)

## 2021-06-12 MED ORDER — CEPHALEXIN 500 MG PO CAPS: 500.0000 mg | ORAL_CAPSULE | Freq: Four times a day (QID) | ORAL | 0 refills | Status: AC

## 2021-06-12 MED ORDER — FLUCONAZOLE 150 MG PO TABS: ORAL_TABLET | ORAL | 0 refills | Status: DC

## 2021-06-14 LAB — URINE CULTURE

## 2021-06-15 DIAGNOSIS — Z808 Family history of malignant neoplasm of other organs or systems: Secondary | ICD-10-CM | POA: Insufficient documentation

## 2021-06-15 NOTE — Progress Notes (Signed)
GYNECOLOGY  VISIT  CC:   dysuria  HPI: 76 y.o. G2P2 Married White or Caucasian female here for complaint of dysuria over the last few days.  Denies blood in her urine.  Has no fever, no back pain, no pelvic pain.  Has not seen any blood in her urine.  Tried to call urology to be seen and is frustrated with response from the office.   No LMP recorded. Patient has had a hysterectomy.   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   Heterozygous factor V Leiden mutation ([place])    Osteopenia    Primary osteoarthritis of both first carpometacarpal joints 05/31/2018   Bartholin's gland abscess 03/08/2014   ASTHMA 10/05/2007   OSTEOARTHRITIS 10/05/2007    Past Medical History:  Diagnosis Date   Contact lens/glasses fitting    wears one contact lt eye   Heterozygous factor V Leiden mutation ([place])    Osteopenia    PONV (postoperative nausea and vomiting)    Superficial vein thrombosis 1997   Varicose veins of both lower extremities     Past Surgical History:  Procedure Laterality Date   BARTHOLIN CYST MARSUPIALIZATION     BARTHOLIN CYST MARSUPIALIZATION Right 08/19/2014   Procedure: BARTHOLIN CYST MARSUPIALIZATION;  Surgeon: Megan Salon, MD;  Location: [facility];  Service: Gynecology;  Laterality: Right;   CARPAL TUNNEL RELEASE Right 12/20/2012   Procedure: CARPAL TUNNEL RELEASE;  Surgeon: Wynonia Sours, MD;  Location: [facility];  Service: Orthopedics;  Laterality: Right;   CARPOMETACARPEL SUSPENSION PLASTY Right 05/29/2019   Procedure: CARPOMETACARPEL [facility]) SUSPENSION PLASTY excision trapexzium tendon transfer, microlink suspection plasty;  Surgeon: Daryll Brod, MD;  Location: [facility];  Service: Orthopedics;  Laterality: Right;  axillary block   CATARACT EXTRACTION Right 2009       CESAREAN SECTION  1973, 1977   x2   EAR CYST EXCISION Left 08/15/2014   Procedure: LEFT THUMB DEBRIDEMENT  INTERPHALANGEAL JOINT LEFT THUMB REMOVAL MUCOID CYST ;  Surgeon: Daryll Brod, MD;  Location: [facility];  Service: Orthopedics;  Laterality: Left;   FINGER SURGERY  2002   lt long finger mass   GANGLION CYST EXCISION Left 2/02   TOTAL ABDOMINAL HYSTERECTOMY W/ BILATERAL SALPINGOOPHORECTOMY  1997   TUBAL LIGATION  1977    MEDS:   Current Outpatient Medications on File Prior to Visit  Medication Sig Dispense Refill   acetaminophen (TYLENOL) 500 MG tablet Take 500 mg by mouth every 4 (four) hours as needed for mild pain.     cholecalciferol (VITAMIN D3) 25 MCG (1000 UNIT) tablet Take 1,000 Units by mouth.     famotidine (PEPCID) 20 MG tablet Take 20 mg by mouth 2 (two) times daily.     ibuprofen (ADVIL,MOTRIN) 200 MG tablet Take 200 mg by mouth every 6 (six) hours as needed for moderate pain.     NONFORMULARY OR COMPOUNDED ITEM Estradiol cream 0.02% in 63ml prefilled syringes.  75ml pv twice weekly.  #66month supply. 1 each 3   Rivaroxaban (XARELTO) 15 MG TABS tablet Take 15 mg by mouth 2 (two) times daily with a meal.     APPLE CIDER VINEGAR PO Take by mouth. (Patient not taking: Reported on 05/15/2021)     calcium carbonate (OS-CAL) 600 MG TABS tablet Take 600 mg by mouth 2 (two) times daily with a meal. (Patient not taking: Reported on 05/15/2021)     calcium  carbonate (TUMS - DOSED IN MG ELEMENTAL CALCIUM) 500 MG chewable tablet Chew 1 tablet by mouth as needed for indigestion or heartburn. (Patient not taking: Reported on 05/15/2021)     fish oil-omega-3 fatty acids 1000 MG capsule Take 2 g by mouth daily. (Patient not taking: Reported on 05/15/2021)     Flaxseed, Linseed, (FLAX SEED OIL PO) Take 1 capsule by mouth daily.  (Patient not taking: Reported on 05/15/2021)     Multiple Vitamins-Minerals (MULTIVITAMIN WITH MINERALS) tablet Take 1 tablet by mouth daily. (Patient not taking: Reported on 05/15/2021)     naproxen sodium (ANAPROX) 220 MG tablet Take 220 mg by mouth daily.  (Patient not  taking: Reported on 06/12/2021)     Plant Sterols and Stanols (CHOLESTOFF PO) Take by mouth daily.  (Patient not taking: Reported on 06/12/2021)     vitamin C (ASCORBIC ACID) 500 MG tablet Take 500 mg by mouth daily. (Patient not taking: Reported on 05/15/2021)     No current facility-administered medications on file prior to visit.    ALLERGIES: Adhesive [tape], Ciprofloxacin, Indomethacin, and Ofloxacin  Family History  Problem Relation Age of Onset   Heart disease Mother    Hyperlipidemia Mother    Thyroid disease Mother    Osteoporosis Mother    Dementia Mother    Breast cancer Mother 8   Diabetes Father    Heart disease Father    Cancer Father        prostate   Cancer Brother        Eye   Cancer Brother        gleoblastoma    SH:  married, non smoker  Review of Systems  Genitourinary:  Positive for dysuria, frequency and urgency. Negative for flank pain and hematuria.   PHYSICAL EXAMINATION:    BP (!) 143/84 (BP Location: Right Arm, Patient Position: Sitting, Cuff Size: Normal)    Pulse 71    Ht 5' 5.75" (1.67 m) Comment: reported   Wt 148 lb 9.6 oz (67.4 kg)    BMI 24.17 kg/m     General appearance: alert, cooperative and appears stated age Flank:  No CVA tenderness Abdomen: soft, non-tender; bowel sounds normal; no masses,  no organomegaly  Assessment/Plan: 1. Frequency of urination - POCT Urinalysis Dipstick - Urine Culture - cephALEXin (KEFLEX) 500 MG capsule; Take 1 capsule (500 mg total) by mouth 4 (four) times daily for 7 days.  Dispense: 28 capsule; Refill: 0

## 2021-06-26 ENCOUNTER — Other Ambulatory Visit (HOSPITAL_BASED_OUTPATIENT_CLINIC_OR_DEPARTMENT_OTHER): Payer: Self-pay | Admitting: Obstetrics & Gynecology

## 2021-06-26 MED ORDER — SULFAMETHOXAZOLE-TRIMETHOPRIM 800-160 MG PO TABS: 1.0000 | ORAL_TABLET | Freq: Two times a day (BID) | ORAL | 0 refills | Status: DC

## 2021-07-13 ENCOUNTER — Ambulatory Visit (HOSPITAL_COMMUNITY)
Admission: RE | Admit: 2021-07-13 | Discharge: 2021-07-13 | Disposition: A | Discharge status: Home / Self Care | Payer: PPO | Source: Ambulatory Visit | Attending: Family Medicine | Admitting: Family Medicine

## 2021-07-13 ENCOUNTER — Other Ambulatory Visit (HOSPITAL_COMMUNITY): Payer: Self-pay | Admitting: Family Medicine

## 2021-07-13 ENCOUNTER — Other Ambulatory Visit: Payer: Self-pay

## 2021-07-13 DIAGNOSIS — I8011 Phlebitis and thrombophlebitis of right femoral vein: Secondary | ICD-10-CM | POA: Diagnosis not present

## 2021-07-14 ENCOUNTER — Encounter (HOSPITAL_BASED_OUTPATIENT_CLINIC_OR_DEPARTMENT_OTHER): Payer: Self-pay | Admitting: Obstetrics & Gynecology

## 2021-07-14 ENCOUNTER — Ambulatory Visit (INDEPENDENT_AMBULATORY_CARE_PROVIDER_SITE_OTHER): Payer: PPO | Admitting: Obstetrics & Gynecology

## 2021-07-14 ENCOUNTER — Ambulatory Visit (HOSPITAL_BASED_OUTPATIENT_CLINIC_OR_DEPARTMENT_OTHER): Payer: Medicare Other | Admitting: Obstetrics & Gynecology

## 2021-07-14 VITALS — BP 134/78 | HR 65 | Ht 65.5 in | Wt 148.0 lb

## 2021-07-14 DIAGNOSIS — Z01411 Encounter for gynecological examination (general) (routine) with abnormal findings: Secondary | ICD-10-CM | POA: Diagnosis not present

## 2021-07-14 DIAGNOSIS — D6851 Activated protein C resistance: Secondary | ICD-10-CM | POA: Diagnosis not present

## 2021-07-14 DIAGNOSIS — M8580 Other specified disorders of bone density and structure, unspecified site: Secondary | ICD-10-CM

## 2021-07-14 DIAGNOSIS — N390 Urinary tract infection, site not specified: Secondary | ICD-10-CM | POA: Diagnosis not present

## 2021-07-14 DIAGNOSIS — Z90710 Acquired absence of both cervix and uterus: Secondary | ICD-10-CM

## 2021-07-14 NOTE — Progress Notes (Signed)
76 y.o. G2P2 Married White or Caucasian female here for breast and pelvic exam.  Doing well.  Denies vaginal bleeding.  H/o recurrent UTIs with urinary urgency.  Most recent symptoms were treated with abx but culture was negative.  Pt is going to try and start with Azo the next time.   ? ?Has issues with superficial thrombosis.  Hx of factor V Leiden heterozygous testing.  Is using some vaginal estrogen cream.  Suggested stopping this for now.  On Xalelto.   ? ? ?No LMP recorded. Patient has had a hysterectomy.          ?Sexually active: Yes.    ?H/O STD:  no ? ?Health Maintenance: ?PCP:  Dr. Harrington Challenger.  Last wellness appt was last March.  Did blood work at that appt: yes ?Vaccines are up to date:  has not done shingrix but declines ?Colonoscopy:  2017, Dr. Watt Climes.  Follow up 10 years ?MMG:  01/02/2021 Negative ?BMD:  12/22/2018 Osteopenia, -1.7, plan to repeat next year ?Last pap smear:  prior to hysterectomy.   ?H/o abnormal pap smear:  no ? ? ? reports that she has never smoked. She has never used smokeless tobacco. She reports current alcohol use. She reports that she does not use drugs. ? ?Past Medical History:  ?Diagnosis Date  ? Contact lens/glasses fitting   ? wears one contact lt eye  ? Heterozygous factor V Leiden mutation ([place])   ? Osteopenia   ? PONV (postoperative nausea and vomiting)   ? Superficial vein thrombosis 1997  ? Varicose veins of both lower extremities   ? ? ?Past Surgical History:  ?Procedure Laterality Date  ? BARTHOLIN CYST MARSUPIALIZATION    ? BARTHOLIN CYST MARSUPIALIZATION Right 08/19/2014  ? Procedure: BARTHOLIN CYST MARSUPIALIZATION;  Surgeon: Megan Salon, MD;  Location: [facility];  Service: Gynecology;  Laterality: Right;  ? CARPAL TUNNEL RELEASE Right 12/20/2012  ? Procedure: CARPAL TUNNEL RELEASE;  Surgeon: Wynonia Sours, MD;  Location: [place];  Service: Orthopedics;  Laterality: Right;  ? CARPOMETACARPEL SUSPENSION PLASTY Right 05/29/2019  ? Procedure: CARPOMETACARPEL  ([place]) SUSPENSION PLASTY excision trapexzium tendon transfer, microlink suspection plasty;  Surgeon: Daryll Brod, MD;  Location: [place];  Service: Orthopedics;  Laterality: Right;  axillary block  ? CATARACT EXTRACTION Right 2009  ?    ? [place]  ? x2  ? EAR CYST EXCISION Left 08/15/2014  ? Procedure: LEFT THUMB DEBRIDEMENT INTERPHALANGEAL JOINT LEFT THUMB REMOVAL MUCOID CYST ;  Surgeon: Daryll Brod, MD;  Location: [facility];  Service: Orthopedics;  Laterality: Left;  ? FINGER SURGERY  2002  ? lt long finger mass  ? GANGLION CYST EXCISION Left 2/02  ? TOTAL ABDOMINAL HYSTERECTOMY W/ BILATERAL SALPINGOOPHORECTOMY  1997  ? TUBAL LIGATION  1977  ? ? ?Current Outpatient Medications  ?Medication Sig Dispense Refill  ? acetaminophen (TYLENOL) 500 MG tablet Take 500 mg by mouth every 4 (four) hours as needed for mild pain.    ? famotidine (PEPCID) 20 MG tablet Take 20 mg by mouth 2 (two) times daily.    ? fluconazole (DIFLUCAN) 150 MG tablet Take 1 and repeat in 72 hours if needed 2 tablet 0  ? NONFORMULARY OR COMPOUNDED ITEM Estradiol cream 0.02% in 16m prefilled syringes.  114mpv twice weekly.  #70m68monthpply. 1 each 3  ? Rivaroxaban (XARELTO) 15 MG TABS tablet Take 15 mg by mouth 2 (two) times daily with a meal.    ?  sulfamethoxazole-trimethoprim (BACTRIM DS) 800-160 MG tablet Take 1 tablet by mouth 2 (two) times daily. (Patient not taking: Reported on 07/14/2021) 10 tablet 0  ? APPLE CIDER VINEGAR PO Take by mouth. (Patient not taking: Reported on 05/15/2021)    ? calcium carbonate (OS-CAL) 600 MG TABS tablet Take 600 mg by mouth 2 (two) times daily with a meal. (Patient not taking: Reported on 05/15/2021)    ? calcium carbonate (TUMS - DOSED IN MG ELEMENTAL CALCIUM) 500 MG chewable tablet Chew 1 tablet by mouth as needed for indigestion or heartburn. (Patient not taking: Reported on 05/15/2021)    ? cholecalciferol (VITAMIN D3) 25 MCG (1000 UNIT) tablet Take 1,000 Units by  mouth. (Patient not taking: Reported on 07/14/2021)    ? fish oil-omega-3 fatty acids 1000 MG capsule Take 2 g by mouth daily. (Patient not taking: Reported on 05/15/2021)    ? Flaxseed, Linseed, (FLAX SEED OIL PO) Take 1 capsule by mouth daily.  (Patient not taking: Reported on 05/15/2021)    ? ibuprofen (ADVIL,MOTRIN) 200 MG tablet Take 200 mg by mouth every 6 (six) hours as needed for moderate pain. (Patient not taking: Reported on 07/14/2021)    ? Multiple Vitamins-Minerals (MULTIVITAMIN WITH MINERALS) tablet Take 1 tablet by mouth daily. (Patient not taking: Reported on 05/15/2021)    ? naproxen sodium (ANAPROX) 220 MG tablet Take 220 mg by mouth daily.  (Patient not taking: Reported on 06/12/2021)    ? Plant Sterols and Stanols (CHOLESTOFF PO) Take by mouth daily.  (Patient not taking: Reported on 06/12/2021)    ? vitamin C (ASCORBIC ACID) 500 MG tablet Take 500 mg by mouth daily. (Patient not taking: Reported on 05/15/2021)    ? ?No current facility-administered medications for this visit.  ? ? ?Family History  ?Problem Relation Age of Onset  ? Heart disease Mother   ? Hyperlipidemia Mother   ? Thyroid disease Mother   ? Osteoporosis Mother   ? Dementia Mother   ? Breast cancer Mother 5  ? Diabetes Father   ? Heart disease Father   ? Cancer Father   ?     prostate  ? Cancer Brother   ?     Eye  ? Cancer Brother   ?     gleoblastoma  ? ? ?Review of Systems  ?All other systems reviewed and are negative. ? ?Exam:   ?BP 134/78 (BP Location: Right Arm, Patient Position: Sitting, Cuff Size: Normal)   Pulse 65   Ht 5' 5.5" (1.664 m) Comment: reported  Wt 148 lb (67.1 kg)   BMI 24.25 kg/m?   Height: 5' 5.5" (166.4 cm) (reported) ? ?General appearance: alert, cooperative and appears stated age ?Breasts: normal appearance, no masses or tenderness ?Abdomen: soft, non-tender; bowel sounds normal; no masses,  no organomegaly ?Lymph nodes: Cervical, supraclavicular, and axillary nodes normal.  No abnormal inguinal nodes  palpated ?Neurologic: Grossly normal ? ?Pelvic: External genitalia:  no lesions ?             Urethra:  normal appearing urethra with no masses, tenderness or lesions ?             Bartholins and Skenes: normal    ?             Vagina: normal appearing vagina with atrophic changes and no discharge, no lesions ?             Cervix: absent ?  Pap taken: No. ?Bimanual Exam:  Uterus:  uterus absent ?             Adnexa: normal adnexa ?              Rectovaginal: Confirms ?              Anus:  normal sphincter tone, no lesions ? ?Chaperone, Octaviano Batty, CMA, was present for exam. ? ?Assessment/Plan: ?1. Encntr for gyn exam (general) (routine) w abnormal findings ?- pap is not indicated ?- MMG 12/2020 ?- Plan BMD next year ?- Colonoscopy up to date ?- vaccines reviewed ?- blood work done with Dr. Harrington Challenger ? ?2. Heterozygous factor V Leiden mutation ([place]) ? ?3. Osteopenia, unspecified location ? ?4. H/O: hysterectomy ? ?5. Frequent UTI ?- pt is going to try azo with next symptoms ?- will check with Dr. Kathyrn Lass about stopping the vaginal estrogen cream if needed ? ? ? ?

## 2021-07-14 NOTE — Patient Instructions (Signed)
Uberlube ?Sliquid ?

## 2021-07-15 ENCOUNTER — Ambulatory Visit (HOSPITAL_BASED_OUTPATIENT_CLINIC_OR_DEPARTMENT_OTHER): Payer: Medicare Other | Admitting: Obstetrics & Gynecology

## 2021-08-10 ENCOUNTER — Other Ambulatory Visit: Payer: Self-pay | Admitting: Family

## 2021-08-10 DIAGNOSIS — D6859 Other primary thrombophilia: Secondary | ICD-10-CM

## 2021-08-10 DIAGNOSIS — I8000 Phlebitis and thrombophlebitis of superficial vessels of unspecified lower extremity: Secondary | ICD-10-CM

## 2021-08-10 DIAGNOSIS — D6851 Activated protein C resistance: Secondary | ICD-10-CM

## 2021-08-11 ENCOUNTER — Inpatient Hospital Stay: Payer: PPO | Attending: Family

## 2021-08-11 ENCOUNTER — Inpatient Hospital Stay (HOSPITAL_BASED_OUTPATIENT_CLINIC_OR_DEPARTMENT_OTHER): Payer: PPO | Admitting: Family

## 2021-08-11 ENCOUNTER — Other Ambulatory Visit: Payer: Self-pay

## 2021-08-11 ENCOUNTER — Encounter: Payer: Self-pay | Admitting: Family

## 2021-08-11 VITALS — BP 119/70 | HR 70 | Temp 98.6°F | Resp 18 | Ht 65.0 in | Wt 147.0 lb

## 2021-08-11 DIAGNOSIS — Z818 Family history of other mental and behavioral disorders: Secondary | ICD-10-CM | POA: Insufficient documentation

## 2021-08-11 DIAGNOSIS — Z881 Allergy status to other antibiotic agents status: Secondary | ICD-10-CM | POA: Insufficient documentation

## 2021-08-11 DIAGNOSIS — I8390 Asymptomatic varicose veins of unspecified lower extremity: Secondary | ICD-10-CM

## 2021-08-11 DIAGNOSIS — Z8042 Family history of malignant neoplasm of prostate: Secondary | ICD-10-CM | POA: Insufficient documentation

## 2021-08-11 DIAGNOSIS — Z888 Allergy status to other drugs, medicaments and biological substances status: Secondary | ICD-10-CM | POA: Diagnosis not present

## 2021-08-11 DIAGNOSIS — Z79890 Hormone replacement therapy (postmenopausal): Secondary | ICD-10-CM

## 2021-08-11 DIAGNOSIS — Z808 Family history of malignant neoplasm of other organs or systems: Secondary | ICD-10-CM | POA: Diagnosis not present

## 2021-08-11 DIAGNOSIS — M8580 Other specified disorders of bone density and structure, unspecified site: Secondary | ICD-10-CM

## 2021-08-11 DIAGNOSIS — I8290 Acute embolism and thrombosis of unspecified vein: Secondary | ICD-10-CM | POA: Diagnosis not present

## 2021-08-11 DIAGNOSIS — Z833 Family history of diabetes mellitus: Secondary | ICD-10-CM | POA: Insufficient documentation

## 2021-08-11 DIAGNOSIS — R202 Paresthesia of skin: Secondary | ICD-10-CM | POA: Diagnosis not present

## 2021-08-11 DIAGNOSIS — I8000 Phlebitis and thrombophlebitis of superficial vessels of unspecified lower extremity: Secondary | ICD-10-CM

## 2021-08-11 DIAGNOSIS — Z90722 Acquired absence of ovaries, bilateral: Secondary | ICD-10-CM | POA: Insufficient documentation

## 2021-08-11 DIAGNOSIS — Z7901 Long term (current) use of anticoagulants: Secondary | ICD-10-CM | POA: Insufficient documentation

## 2021-08-11 DIAGNOSIS — Z86718 Personal history of other venous thrombosis and embolism: Secondary | ICD-10-CM

## 2021-08-11 DIAGNOSIS — Z8262 Family history of osteoporosis: Secondary | ICD-10-CM | POA: Diagnosis not present

## 2021-08-11 DIAGNOSIS — R200 Anesthesia of skin: Secondary | ICD-10-CM | POA: Diagnosis not present

## 2021-08-11 DIAGNOSIS — Z87440 Personal history of urinary (tract) infections: Secondary | ICD-10-CM | POA: Insufficient documentation

## 2021-08-11 DIAGNOSIS — D6851 Activated protein C resistance: Secondary | ICD-10-CM | POA: Diagnosis not present

## 2021-08-11 DIAGNOSIS — Z803 Family history of malignant neoplasm of breast: Secondary | ICD-10-CM | POA: Insufficient documentation

## 2021-08-11 DIAGNOSIS — Z8249 Family history of ischemic heart disease and other diseases of the circulatory system: Secondary | ICD-10-CM | POA: Insufficient documentation

## 2021-08-11 DIAGNOSIS — Z8349 Family history of other endocrine, nutritional and metabolic diseases: Secondary | ICD-10-CM

## 2021-08-11 DIAGNOSIS — D6859 Other primary thrombophilia: Secondary | ICD-10-CM

## 2021-08-11 DIAGNOSIS — Z79899 Other long term (current) drug therapy: Secondary | ICD-10-CM | POA: Diagnosis not present

## 2021-08-11 LAB — CBC WITH DIFFERENTIAL (CANCER CENTER ONLY)
Abs Immature Granulocytes: 0.01 10*3/uL (ref 0.00–0.07)
Basophils Absolute: 0 10*3/uL (ref 0.0–0.1)
Basophils Relative: 1 %
Eosinophils Absolute: 0.1 10*3/uL (ref 0.0–0.5)
Eosinophils Relative: 1 %
HCT: 39.6 % (ref 36.0–46.0)
Hemoglobin: 13.5 g/dL (ref 12.0–15.0)
Immature Granulocytes: 0 %
Lymphocytes Relative: 33 %
Lymphs Abs: 2.2 10*3/uL (ref 0.7–4.0)
MCH: 32.1 pg (ref 26.0–34.0)
MCHC: 34.1 g/dL (ref 30.0–36.0)
MCV: 94.3 fL (ref 80.0–100.0)
Monocytes Absolute: 0.4 10*3/uL (ref 0.1–1.0)
Monocytes Relative: 6 %
Neutro Abs: 3.9 10*3/uL (ref 1.7–7.7)
Neutrophils Relative %: 59 %
Platelet Count: 210 10*3/uL (ref 150–400)
RBC: 4.2 MIL/uL (ref 3.87–5.11)
RDW: 12.1 % (ref 11.5–15.5)
WBC Count: 6.6 10*3/uL (ref 4.0–10.5)
nRBC: 0 % (ref 0.0–0.2)

## 2021-08-11 LAB — ANTITHROMBIN III: AntiThromb III Func: 99 % (ref 75–120)

## 2021-08-11 LAB — CMP (CANCER CENTER ONLY)
ALT: 20 U/L (ref 0–44)
AST: 26 U/L (ref 15–41)
Albumin: 4.1 g/dL (ref 3.5–5.0)
Alkaline Phosphatase: 86 U/L (ref 38–126)
Anion gap: 8 (ref 5–15)
BUN: 17 mg/dL (ref 8–23)
CO2: 25 mmol/L (ref 22–32)
Calcium: 9.3 mg/dL (ref 8.9–10.3)
Chloride: 105 mmol/L (ref 98–111)
Creatinine: 0.79 mg/dL (ref 0.44–1.00)
GFR, Estimated: >60 mL/min (ref >60)
Glucose, Bld: 141 mg/dL — ABNORMAL HIGH (ref 70–99)
Potassium: 3.2 mmol/L — ABNORMAL LOW (ref 3.5–5.1)
Sodium: 138 mmol/L (ref 135–145)
Total Bilirubin: 0.6 mg/dL (ref 0.3–1.2)
Total Protein: 7.2 g/dL (ref 6.5–8.1)

## 2021-08-11 LAB — D-DIMER, QUANTITATIVE: D-Dimer, Quant: 0.31 ug/mL-FEU (ref 0.00–0.50)

## 2021-08-11 NOTE — Progress Notes (Signed)
Hematology/Oncology Consultation  ? ?Name: TEKEYAH SANTIAGO      MRN: [id number]    Location: Room/bed info not found  Date: 08/11/2021 Time:3:01 PM ? ? ?REFERRING PHYSICIAN:  Charles A. Harrington Challenger, MD ? ?REASON FOR CONSULT: Acute embolism and thrombosis of unspecified vein ?  ?DIAGNOSIS:  ?Factor V Leiden ?Remote history of left lower extremity superficial thrombus ?Continued right superficial thrombus involving the great saphenous vein, now greater that 10 cm from the saphenofemoral junction  ?Superficial thrombus involving the right thigh varicosities, proximal and mid small saphenous veins ? ?HISTORY OF PRESENT ILLNESS:  Ms. Strieter is a very pleasant 76 yo caucasian female with significant history of thrombotic events starting 25 years ago with a left lower extremity superficial thrombus post hysterectomy and long car ride. This was treated with Coumadin.  ?She now has right superficial thrombi effecting the greater saphenous vein, thigh varicosities, proximal and mid small saphenous veins. She is tolerating Xarelto nicely at 20 mg PO daily.  ?She has had no issue with bleeding, abnormal bruising or petechiae.  ?She states that she does have history of Factor V Leiden and actually saw Dr. Marin Olp for her initial thrombotic event over 20 years ago.  ?Her daughter also has Factor V.  ?No known familial history of thrombotic event.  ?Both her paternal and maternal grandmothers had history of stroke.  ?She completed HRT in 2017.  ?She sees gynecologist Dr. Ammie Ferrier and uses estradiol cream with history of frequent UTI's. OK to continue this per MD.  ?She has 2 children and no history of miscarriage.  ?Mammogram in August 2022 was negative.  ?No history of diabetes or thyroid disease.  ?No fever, chills, n/v, cough, rash, dizziness, SOB, chest pain, palpitations, abdominal pain or changes in bowel or bladder habits.  ?She has numbness and tingling in her left wrist that comes and goes due to carpal tunnel. She wears a support  brace at night.  ?She notes that the sewlling in her right this seems better. Swelling in the lower right leg comes and goes. Non present at this time.  ?Pedal pulses are 1+.  ?No falls or syncope to report.  ?No smoking or recreational drug use.  ?She has the occasional glass of wine with dinner.  ?She has maintained a good appetite and is staying well hydrated throughout the day. Her weight is described as stable at 147 lbs.  ? ?ROS: All other 10 point review of systems is negative.  ? ?PAST MEDICAL HISTORY:   ?Past Medical History:  ?Diagnosis Date  ? Contact lens/glasses fitting   ? wears one contact lt eye  ? Heterozygous factor V Leiden mutation ([place])   ? Osteopenia   ? PONV (postoperative nausea and vomiting)   ? Superficial vein thrombosis 1997  ? Varicose veins of both lower extremities   ? ? ?ALLERGIES: ?Allergies  ?Allergen Reactions  ? Ciprofloxacin Itching and Swelling  ?  Swelling lips  ? Indomethacin Other (See Comments)  ?  Felt chest pain, possible esophageal spasm  ? Ofloxacin Itching and Swelling  ? Adhesive [Tape] Rash  ?  Tapes cause blister, need to use paper tape ?  ? ?   ?MEDICATIONS:  ?Current Outpatient Medications on File Prior to Visit  ?Medication Sig Dispense Refill  ? sulfamethoxazole-trimethoprim (BACTRIM DS) 800-160 MG tablet Take 1 tablet by mouth 2 (two) times daily. (Patient not taking: Reported on 07/14/2021) 10 tablet 0  ? acetaminophen (TYLENOL) 500 MG tablet Take 500 mg by  mouth every 4 (four) hours as needed for mild pain.    ? APPLE CIDER VINEGAR PO Take by mouth. (Patient not taking: Reported on 05/15/2021)    ? calcium carbonate (OS-CAL) 600 MG TABS tablet Take 600 mg by mouth 2 (two) times daily with a meal. (Patient not taking: Reported on 05/15/2021)    ? calcium carbonate (TUMS - DOSED IN MG ELEMENTAL CALCIUM) 500 MG chewable tablet Chew 1 tablet by mouth as needed for indigestion or heartburn. (Patient not taking: Reported on 05/15/2021)    ? cholecalciferol (VITAMIN D3) 25  MCG (1000 UNIT) tablet Take 1,000 Units by mouth. (Patient not taking: Reported on 07/14/2021)    ? famotidine (PEPCID) 20 MG tablet Take 20 mg by mouth 2 (two) times daily.    ? fish oil-omega-3 fatty acids 1000 MG capsule Take 2 g by mouth daily. (Patient not taking: Reported on 05/15/2021)    ? Flaxseed, Linseed, (FLAX SEED OIL PO) Take 1 capsule by mouth daily.  (Patient not taking: Reported on 05/15/2021)    ? Multiple Vitamins-Minerals (MULTIVITAMIN WITH MINERALS) tablet Take 1 tablet by mouth daily. (Patient not taking: Reported on 05/15/2021)    ? NONFORMULARY OR COMPOUNDED ITEM Estradiol cream 0.02% in 62m prefilled syringes.  157mpv twice weekly.  #28m128monthpply. 1 each 3  ? Plant Sterols and Stanols (CHOLESTOFF PO) Take by mouth daily.  (Patient not taking: Reported on 06/12/2021)    ? Rivaroxaban (XARELTO) 15 MG TABS tablet Take 15 mg by mouth 2 (two) times daily with a meal.    ? vitamin C (ASCORBIC ACID) 500 MG tablet Take 500 mg by mouth daily. (Patient not taking: Reported on 05/15/2021)    ? ?No current facility-administered medications on file prior to visit.  ? ?  ?PAST SURGICAL HISTORY ?Past Surgical History:  ?Procedure Laterality Date  ? BARTHOLIN CYST MARSUPIALIZATION    ? BARTHOLIN CYST MARSUPIALIZATION Right 08/19/2014  ? Procedure: BARTHOLIN CYST MARSUPIALIZATION;  Surgeon: MarMegan SalonD;  [facility];  Service: Gynecology;  Laterality: Right;  ? CARPAL TUNNEL RELEASE Right 12/20/2012  ? Procedure: CARPAL TUNNEL RELEASE;  Surgeon: GarWynonia SoursD;  [facility];  Laterality: Right;  ? CARPOMETACARPEL SUSPENSION PLASTY Right 05/29/2019  ? Procedure: [place] excision trapexzium tendon transfer, microlink suspection plasty;  Surgeon: KuzDaryll BrodD;  [facility];  Laterality: Right;  axillary block  ? CATARACT EXTRACTION Right 2009  ?    [place]   ? EAR CYST EXCISION Left 08/15/2014  ? Procedure: LEFT THUMB DEBRIDEMENT INTERPHALANGEAL JOINT LEFT THUMB REMOVAL MUCOID CYST ;  Surgeon: GarDaryll BrodD;  [facility];  Laterality: Left;  ? FINGER SURGERY  2002  ? lt long finger mass  ? GANGLION CYST EXCISION Left 2/02  ? TOTAL ABDOMINAL HYSTERECTOMY W/ BILATERAL SALPINGOOPHORECTOMY  1997  ? TUBAL LIGATION  1977  ? ? ?FAMILY HISTORY: ?Family History  ?Problem Relation Age of Onset  ? Heart disease Mother   ? Hyperlipidemia Mother   ? Thyroid disease Mother   ? Osteoporosis Mother   ? Dementia Mother   ? Breast cancer Mother 86 54 Diabetes Father   ? Heart disease Father   ? Cancer Father   ?     prostate  ? Cancer Brother   ?     Eye  ?  Cancer Brother   ?     gleoblastoma  ? ? ?SOCIAL HISTORY: ? reports that she has never smoked. She has never used smokeless tobacco. She reports current alcohol use. She reports that she does not use drugs. ? ?PERFORMANCE STATUS: ?The patient's performance status is 1 - Symptomatic but completely ambulatory ? ?PHYSICAL EXAM: ?Most Recent Vital Signs: There were no vitals taken for this visit. ?BP 119/70 (BP Location: Left Arm, Patient Position: Sitting)   Pulse 70   Temp 98.6 ?F (37 ?C) (Oral)   Resp 18   Ht '5\' 5"'$  (1.651 m)   Wt 147 lb (66.7 kg)   SpO2 100%   BMI 24.46 kg/m?  ? ?General Appearance:    Alert, cooperative, no distress, appears stated age  ?Head:    Normocephalic, without obvious abnormality, atraumatic  ?Eyes:    PERRL, conjunctiva/corneas clear, EOM's intact, fundi  ?  benign, both eyes  ?   ?   ?Throat:   Lips, mucosa, and tongue normal; teeth and gums normal  ?Neck:   Supple, symmetrical, trachea midline, no adenopathy;  ?  thyroid:  no enlargement/tenderness/nodules; no carotid ?  bruit or JVD  ?Back:     Symmetric, no curvature, ROM normal, no CVA tenderness  ?Lungs:     Clear to auscultation bilaterally, respirations unlabored  ?Chest Wall:    No tenderness or  deformity  ? Heart:    Regular rate and rhythm, S1 and S2 normal, no murmur, rub   or gallop  ?   ?Abdomen:     Soft, non-tender, bowel sounds active all four quadrants,  ?  no masses, no organomegaly  ?   ?   ?E

## 2021-08-12 LAB — CARDIOLIPIN ANTIBODIES, IGG, IGM, IGA
Anticardiolipin IgA: <9 APL U/mL (ref 0–11)
Anticardiolipin IgG: <9 GPL U/mL (ref 0–14)
Anticardiolipin IgM: <9 MPL U/mL (ref 0–12)

## 2021-08-12 LAB — HOMOCYSTEINE: Homocysteine: 7.9 umol/L (ref 0.0–19.2)

## 2021-08-13 LAB — PROTEIN S ACTIVITY: Protein S Activity: 111 % (ref 63–140)

## 2021-08-13 LAB — PROTEIN C, TOTAL: Protein C, Total: 88 % (ref 60–150)

## 2021-08-13 LAB — BETA-2-GLYCOPROTEIN I ABS, IGG/M/A
Beta-2 Glyco I IgG: <9 GPI IgG units (ref 0–20)
Beta-2-Glycoprotein I IgA: <9 GPI IgA units (ref 0–25)
Beta-2-Glycoprotein I IgM: <9 GPI IgM units (ref 0–32)

## 2021-08-13 LAB — LUPUS ANTICOAGULANT PANEL
DRVVT: 91.2 s — ABNORMAL HIGH (ref 0.0–47.0)
PTT Lupus Anticoagulant: 42 s (ref 0.0–43.5)

## 2021-08-13 LAB — PROTEIN C ACTIVITY: Protein C Activity: 114 % (ref 73–180)

## 2021-08-13 LAB — DRVVT MIX: dRVVT Mix: 65.3 s — ABNORMAL HIGH (ref 0.0–40.4)

## 2021-08-13 LAB — PROTEIN S, TOTAL: Protein S Ag, Total: 84 % (ref 60–150)

## 2021-08-13 LAB — DRVVT CONFIRM: dRVVT Confirm: 1.4 ratio — ABNORMAL HIGH (ref 0.8–1.2)

## 2021-08-14 ENCOUNTER — Telehealth: Payer: Self-pay | Admitting: *Deleted

## 2021-08-14 NOTE — Telephone Encounter (Signed)
Per 08/11/21 los - gave upcoming appointments - confirmed ?

## 2021-08-17 LAB — PROTHROMBIN GENE MUTATION

## 2021-08-17 LAB — FACTOR 5 LEIDEN

## 2021-08-18 ENCOUNTER — Telehealth: Payer: Self-pay | Admitting: Family

## 2021-08-18 NOTE — Telephone Encounter (Signed)
Left message with call back number to go over recent lab work.  ?

## 2021-08-21 ENCOUNTER — Telehealth: Payer: Self-pay | Admitting: Family

## 2021-08-21 NOTE — Telephone Encounter (Signed)
I was able to speak with [name] and go over recent hyper coag panel work up. She has confirmed Factor V Leiden and also had the positive Lupus anticoagulant. No changes to her treatment regimen. She will continue the Xarelto 20 mg PO daily. Follow-up in July. No questions or concerns at this time. Patient appreciative of call.  ?

## 2021-08-29 ENCOUNTER — Encounter (HOSPITAL_BASED_OUTPATIENT_CLINIC_OR_DEPARTMENT_OTHER): Payer: Self-pay | Admitting: Obstetrics & Gynecology

## 2021-09-04 ENCOUNTER — Other Ambulatory Visit: Payer: Self-pay | Admitting: Family

## 2021-09-04 DIAGNOSIS — I8000 Phlebitis and thrombophlebitis of superficial vessels of unspecified lower extremity: Secondary | ICD-10-CM

## 2021-09-04 DIAGNOSIS — D6851 Activated protein C resistance: Secondary | ICD-10-CM

## 2021-09-04 DIAGNOSIS — D6859 Other primary thrombophilia: Secondary | ICD-10-CM

## 2021-09-04 MED ORDER — RIVAROXABAN 20 MG PO TABS: 20.0000 mg | ORAL_TABLET | Freq: Every day | ORAL | 3 refills | Status: DC

## 2021-10-01 ENCOUNTER — Ambulatory Visit (INDEPENDENT_AMBULATORY_CARE_PROVIDER_SITE_OTHER): Payer: PPO | Admitting: *Deleted

## 2021-10-01 ENCOUNTER — Other Ambulatory Visit (HOSPITAL_BASED_OUTPATIENT_CLINIC_OR_DEPARTMENT_OTHER): Payer: Self-pay | Admitting: Obstetrics & Gynecology

## 2021-10-01 DIAGNOSIS — R300 Dysuria: Secondary | ICD-10-CM

## 2021-10-01 DIAGNOSIS — N390 Urinary tract infection, site not specified: Secondary | ICD-10-CM | POA: Diagnosis not present

## 2021-10-01 LAB — POCT URINALYSIS DIPSTICK
Appearance: NORMAL
Bilirubin, UA: NEGATIVE
Glucose, UA: NEGATIVE
Ketones, UA: NEGATIVE
Nitrite, UA: NEGATIVE
Protein, UA: NEGATIVE
Spec Grav, UA: 1.015 (ref 1.010–1.025)
Urobilinogen, UA: 0.2 E.U./dL
pH, UA: 7 (ref 5.0–8.0)

## 2021-10-01 MED ORDER — NITROFURANTOIN MONOHYD MACRO 100 MG PO CAPS: 100.0000 mg | ORAL_CAPSULE | Freq: Two times a day (BID) | ORAL | 0 refills | Status: DC

## 2021-10-01 NOTE — Progress Notes (Signed)
Pt c/o burning and pain with urination, urinary urgency and frequency. Clean catch urine obtained to send for culture. Rx sent to pharmacy.

## 2021-10-04 LAB — URINE CULTURE

## 2021-11-09 ENCOUNTER — Other Ambulatory Visit: Payer: Self-pay | Admitting: Family

## 2021-11-09 DIAGNOSIS — I8000 Phlebitis and thrombophlebitis of superficial vessels of unspecified lower extremity: Secondary | ICD-10-CM

## 2021-11-09 DIAGNOSIS — D6859 Other primary thrombophilia: Secondary | ICD-10-CM

## 2021-11-09 DIAGNOSIS — R760 Raised antibody titer: Secondary | ICD-10-CM

## 2021-11-09 DIAGNOSIS — D6851 Activated protein C resistance: Secondary | ICD-10-CM

## 2021-11-11 ENCOUNTER — Inpatient Hospital Stay: Payer: PPO | Attending: Family

## 2021-11-11 ENCOUNTER — Ambulatory Visit (HOSPITAL_BASED_OUTPATIENT_CLINIC_OR_DEPARTMENT_OTHER)
Admission: RE | Admit: 2021-11-11 | Discharge: 2021-11-11 | Disposition: A | Discharge status: Home / Self Care | Payer: PPO | Source: Ambulatory Visit | Attending: Family | Admitting: Family

## 2021-11-11 ENCOUNTER — Encounter: Payer: Self-pay | Admitting: Family

## 2021-11-11 ENCOUNTER — Inpatient Hospital Stay: Payer: PPO | Admitting: Family

## 2021-11-11 ENCOUNTER — Ambulatory Visit: Payer: PPO | Admitting: Family

## 2021-11-11 VITALS — BP 126/60 | HR 70 | Temp 98.3°F | Resp 17 | Wt 148.4 lb

## 2021-11-11 DIAGNOSIS — Z86718 Personal history of other venous thrombosis and embolism: Secondary | ICD-10-CM | POA: Diagnosis present

## 2021-11-11 DIAGNOSIS — D6851 Activated protein C resistance: Secondary | ICD-10-CM | POA: Diagnosis present

## 2021-11-11 DIAGNOSIS — I8000 Phlebitis and thrombophlebitis of superficial vessels of unspecified lower extremity: Secondary | ICD-10-CM | POA: Insufficient documentation

## 2021-11-11 DIAGNOSIS — D6859 Other primary thrombophilia: Secondary | ICD-10-CM

## 2021-11-11 DIAGNOSIS — R760 Raised antibody titer: Secondary | ICD-10-CM

## 2021-11-11 DIAGNOSIS — Z7901 Long term (current) use of anticoagulants: Secondary | ICD-10-CM | POA: Diagnosis not present

## 2021-11-11 LAB — CBC WITH DIFFERENTIAL (CANCER CENTER ONLY)
Abs Immature Granulocytes: 0.01 10*3/uL (ref 0.00–0.07)
Basophils Absolute: 0.1 10*3/uL (ref 0.0–0.1)
Basophils Relative: 1 %
Eosinophils Absolute: 0.1 10*3/uL (ref 0.0–0.5)
Eosinophils Relative: 1 %
HCT: 42.5 % (ref 36.0–46.0)
Hemoglobin: 14.4 g/dL (ref 12.0–15.0)
Immature Granulocytes: 0 %
Lymphocytes Relative: 37 %
Lymphs Abs: 2.6 10*3/uL (ref 0.7–4.0)
MCH: 32.4 pg (ref 26.0–34.0)
MCHC: 33.9 g/dL (ref 30.0–36.0)
MCV: 95.5 fL (ref 80.0–100.0)
Monocytes Absolute: 0.6 10*3/uL (ref 0.1–1.0)
Monocytes Relative: 9 %
Neutro Abs: 3.6 10*3/uL (ref 1.7–7.7)
Neutrophils Relative %: 52 %
Platelet Count: 200 10*3/uL (ref 150–400)
RBC: 4.45 MIL/uL (ref 3.87–5.11)
RDW: 11.9 % (ref 11.5–15.5)
WBC Count: 6.8 10*3/uL (ref 4.0–10.5)
nRBC: 0 % (ref 0.0–0.2)

## 2021-11-11 LAB — CMP (CANCER CENTER ONLY)
ALT: 16 U/L (ref 0–44)
AST: 20 U/L (ref 15–41)
Albumin: 4.6 g/dL (ref 3.5–5.0)
Alkaline Phosphatase: 81 U/L (ref 38–126)
Anion gap: 7 (ref 5–15)
BUN: 20 mg/dL (ref 8–23)
CO2: 29 mmol/L (ref 22–32)
Calcium: 10.2 mg/dL (ref 8.9–10.3)
Chloride: 105 mmol/L (ref 98–111)
Creatinine: 0.99 mg/dL (ref 0.44–1.00)
GFR, Estimated: 59 mL/min — ABNORMAL LOW (ref >60)
Glucose, Bld: 69 mg/dL — ABNORMAL LOW (ref 70–99)
Potassium: 4.4 mmol/L (ref 3.5–5.1)
Sodium: 141 mmol/L (ref 135–145)
Total Bilirubin: 0.3 mg/dL (ref 0.3–1.2)
Total Protein: 7.1 g/dL (ref 6.5–8.1)

## 2021-11-11 NOTE — Progress Notes (Signed)
Hematology and Oncology Follow Up Visit  [name] [id number] 18-Mar-1946 76 y.o. 11/11/2021   Principle Diagnosis:  Factor V Leiden Remote history of left lower extremity superficial thrombus Continued right superficial thrombus involving the great saphenous vein, now greater that 10 cm from the saphenofemoral junction  Superficial thrombus involving the right thigh varicosities, proximal and mid small saphenous veins  Current Therapy:   Xarelto 20 mg PO daily   Interim History:  [name] is here today for follow-up. [place] today showed persistent non occlusive superficial thrombophlebitis involving the right greater saphenous vein.  She has had no issue with blood loss, bruising or petechiae. She states that she is taking her Xarelto as prescribed.  She does have varicose veins.  No fever, chills, n/v, cough, rash, dizziness, SOB, chest pain, palpitations, abdominal pain or changes in bowel or bladder habits.  No swelling, tenderness, numbness or tingling in her extremities.  No falls or syncope.  She has maintained a good appetite and is staying well hydrated. Her weight is stable at 148 lbs.   ECOG Performance Status: 1 - Symptomatic but completely ambulatory  Medications:  Allergies as of 11/11/2021       Reactions   Ciprofloxacin Itching, Swelling   Swelling lips   Indomethacin Other (See Comments)   Felt chest pain, possible esophageal spasm   Ofloxacin Itching, Swelling   Adhesive [tape] Rash   Tapes cause blister, need to use paper tape        Medication List        Accurate as of November 11, 2021 12:00 PM. If you have any questions, ask your nurse or doctor.          acetaminophen 500 MG tablet Commonly known as: TYLENOL Take 500 mg by mouth every 4 (four) hours as needed for mild pain.   APPLE CIDER VINEGAR PO Take by mouth.   calcium carbonate 500 MG chewable tablet Commonly known as: TUMS - dosed in mg elemental calcium Chew 1 tablet by mouth as needed  for indigestion or heartburn.   calcium carbonate 600 MG Tabs tablet Commonly known as: OS-CAL Take 600 mg by mouth 2 (two) times daily with a meal.   cholecalciferol 25 MCG (1000 UNIT) tablet Commonly known as: VITAMIN D3 Take 1,000 Units by mouth.   CHOLESTOFF PO Take by mouth daily.   famotidine 20 MG tablet Commonly known as: PEPCID Take 20 mg by mouth 2 (two) times daily.   fish oil-omega-3 fatty acids 1000 MG capsule Take 2 g by mouth daily.   FLAX SEED OIL PO Take 1 capsule by mouth daily.   multivitamin with minerals tablet Take 1 tablet by mouth daily.   nitrofurantoin (macrocrystal-monohydrate) 100 MG capsule Commonly known as: MACROBID Take 1 capsule (100 mg total) by mouth 2 (two) times daily.   NONFORMULARY OR COMPOUNDED ITEM Estradiol cream 0.02% in 16m prefilled syringes.  144mpv twice weekly.  #17m42monthpply.   rivaroxaban 20 MG Tabs tablet Commonly known as: XARELTO Take 1 tablet (20 mg total) by mouth daily at 6 (six) AM.   vitamin C 500 MG tablet Commonly known as: ASCORBIC ACID Take 500 mg by mouth daily.        Allergies:  Allergies  Allergen Reactions   Ciprofloxacin Itching and Swelling    Swelling lips   Indomethacin Other (See Comments)    Felt chest pain, possible esophageal spasm   Ofloxacin Itching and Swelling   Adhesive [Tape] Rash  Tapes cause blister, need to use paper tape     Past Medical History, Surgical history, Social history, and Family History were reviewed and updated.  Review of Systems: All other 10 point review of systems is negative.   Physical Exam:  vitals were not taken for this visit.   Wt Readings from Last 3 Encounters:  08/11/21 147 lb (66.7 kg)  07/14/21 148 lb (67.1 kg)  06/12/21 148 lb 9.6 oz (67.4 kg)    Ocular: Sclerae unicteric, pupils equal, round and reactive to light Ear-nose-throat: Oropharynx clear, dentition fair Lymphatic: No cervical or supraclavicular adenopathy Lungs no  rales or rhonchi, good excursion bilaterally Heart regular rate and rhythm, no murmur appreciated Abd soft, nontender, positive bowel sounds MSK no focal spinal tenderness, no joint edema Neuro: non-focal, well-oriented, appropriate affect Breasts: Deferred  Lab Results  Component Value Date   WBC 6.8 11/11/2021   HGB 14.4 11/11/2021   HCT 42.5 11/11/2021   MCV 95.5 11/11/2021   PLT 200 11/11/2021   No results found for: "FERRITIN", "IRON", "TIBC", "UIBC", "IRONPCTSAT" Lab Results  Component Value Date   RBC 4.45 11/11/2021   No results found for: "KPAFRELGTCHN", "LAMBDASER", "KAPLAMBRATIO" No results found for: "IGGSERUM", "IGA", "IGMSERUM" No results found for: "TOTALPROTELP", "ALBUMINELP", "A1GS", "A2GS", "BETS", "BETA2SER", "GAMS", "MSPIKE", "SPEI"   Chemistry      Component Value Date/Time   NA 141 11/11/2021 1006   K 4.4 11/11/2021 1006   CL 105 11/11/2021 1006   CO2 29 11/11/2021 1006   BUN 20 11/11/2021 1006   CREATININE 0.99 11/11/2021 1006   CREATININE 0.75 12/04/2015 1517      Component Value Date/Time   CALCIUM 10.2 11/11/2021 1006   ALKPHOS 81 11/11/2021 1006   AST 20 11/11/2021 1006   ALT 16 11/11/2021 1006   BILITOT 0.3 11/11/2021 1006       Impression and Plan: [name] is a very pleasant 76 yo caucasian female with significant history of thrombotic events starting 25 years ago with a left lower extremity superficial thrombus post hysterectomy and long car ride. She has factor V Leiden and was positive lupus anticoagulant.  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 She is doing well on Xarelto and will continue her same regimen.  Follow-up in 6 months.   Lottie Dawson, NP 7/5/202312:00 PM

## 2021-11-13 LAB — DRVVT MIX: dRVVT Mix: 111.2 s — ABNORMAL HIGH (ref 0.0–40.4)

## 2021-11-13 LAB — LUPUS ANTICOAGULANT PANEL
DRVVT: 144.5 s — ABNORMAL HIGH (ref 0.0–47.0)
PTT Lupus Anticoagulant: 54.7 s — ABNORMAL HIGH (ref 0.0–43.5)

## 2021-11-13 LAB — DRVVT CONFIRM: dRVVT Confirm: 1.7 ratio — ABNORMAL HIGH (ref 0.8–1.2)

## 2021-11-13 LAB — HEXAGONAL PHASE PHOSPHOLIPID: Hexagonal Phase Phospholipid: 10 s (ref 0–11)

## 2021-11-13 LAB — PTT-LA MIX: PTT-LA Mix: 47.1 s — ABNORMAL HIGH (ref 0.0–40.5)

## 2021-11-24 ENCOUNTER — Other Ambulatory Visit (HOSPITAL_BASED_OUTPATIENT_CLINIC_OR_DEPARTMENT_OTHER): Payer: Self-pay | Admitting: Family Medicine

## 2021-11-24 DIAGNOSIS — Z1231 Encounter for screening mammogram for malignant neoplasm of breast: Secondary | ICD-10-CM

## 2022-01-01 ENCOUNTER — Other Ambulatory Visit: Payer: Self-pay | Admitting: Family

## 2022-01-01 DIAGNOSIS — D6851 Activated protein C resistance: Secondary | ICD-10-CM

## 2022-01-01 DIAGNOSIS — D6859 Other primary thrombophilia: Secondary | ICD-10-CM

## 2022-01-01 DIAGNOSIS — I8000 Phlebitis and thrombophlebitis of superficial vessels of unspecified lower extremity: Secondary | ICD-10-CM

## 2022-01-15 ENCOUNTER — Ambulatory Visit (HOSPITAL_BASED_OUTPATIENT_CLINIC_OR_DEPARTMENT_OTHER)
Admission: RE | Admit: 2022-01-15 | Discharge: 2022-01-15 | Disposition: A | Discharge status: Home / Self Care | Payer: PPO | Source: Ambulatory Visit | Attending: Family Medicine | Admitting: Family Medicine

## 2022-01-15 ENCOUNTER — Encounter (HOSPITAL_BASED_OUTPATIENT_CLINIC_OR_DEPARTMENT_OTHER): Payer: Self-pay | Admitting: Radiology

## 2022-01-15 DIAGNOSIS — Z1231 Encounter for screening mammogram for malignant neoplasm of breast: Secondary | ICD-10-CM

## 2022-04-23 ENCOUNTER — Other Ambulatory Visit: Payer: Self-pay | Admitting: Sports Medicine

## 2022-04-23 DIAGNOSIS — M545 Low back pain, unspecified: Secondary | ICD-10-CM

## 2022-05-01 ENCOUNTER — Other Ambulatory Visit: Payer: Self-pay | Admitting: Hematology & Oncology

## 2022-05-01 DIAGNOSIS — D6859 Other primary thrombophilia: Secondary | ICD-10-CM

## 2022-05-01 DIAGNOSIS — D6851 Activated protein C resistance: Secondary | ICD-10-CM

## 2022-05-01 DIAGNOSIS — I8000 Phlebitis and thrombophlebitis of superficial vessels of unspecified lower extremity: Secondary | ICD-10-CM

## 2022-05-13 NOTE — Progress Notes (Signed)
[facility] Note  05/14/2022     CHIEF COMPLAINT Patient presents for Retina Follow Up  HISTORY OF PRESENT ILLNESS: [name] is a 77 y.o. female who presents to the clinic today for:   HPI     Retina Follow Up   Patient presents with  Other.  In left eye.  Severity is moderate.  Duration of 12 months.  Since onset it is stable.  I, the attending physician,  performed the HPI with the patient and updated documentation appropriately.        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 Pt states VA is about the same. States the OS has blurriness sometimes that comes and goes.       Last edited by Bernarda Caffey, MD on 05/14/2022 12:54 PM.     Referring physician: Lawerance Cruel, MD [place],  [place] [id number]  HISTORICAL INFORMATION:   Selected notes from the MEDICAL RECORD NUMBER Referred by Dr. 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 (Ophthalmic Drugs)   No current facility-administered medications for this visit. (Ophthalmic Drugs)   Current Outpatient Medications (Other)  Medication Sig   acetaminophen (TYLENOL) 500 MG tablet Take 500 mg by mouth every 4 (four) hours as needed for mild pain.   APPLE CIDER VINEGAR PO Take by mouth.   calcium carbonate (OS-CAL) 600 MG TABS tablet Take 600 mg by mouth 2 (two) times daily with a meal.   calcium carbonate (TUMS - DOSED IN MG ELEMENTAL CALCIUM) 500 MG chewable tablet Chew 1 tablet by mouth as needed for indigestion or heartburn.   cholecalciferol (VITAMIN D3) 25 MCG (1000 UNIT) tablet Take 1,000 Units by mouth.   famotidine (PEPCID) 20 MG tablet Take 20 mg by mouth 2 (two) times daily.   fish oil-omega-3 fatty acids 1000 MG capsule Take 2 g by mouth daily.   Flaxseed, Linseed, (FLAX SEED OIL PO) Take 1 capsule by mouth daily.   Multiple Vitamins-Minerals (MULTIVITAMIN WITH MINERALS) tablet  Take 1 tablet by mouth daily.   NONFORMULARY OR COMPOUNDED ITEM Estradiol cream 0.02% in 92m prefilled syringes.  1459mpv twice weekly.  #59m66monthpply.   Plant Sterols and Stanols (CHOLESTOFF PO) Take by mouth daily.   vitamin C (ASCORBIC ACID) 500 MG tablet Take 500 mg by mouth daily.   XARELTO 20 MG TABS tablet TAKE 1 TABLET(20 MG) BY MOUTH DAILY AT 6 AM   nitrofurantoin, macrocrystal-monohydrate, (MACROBID) 100 MG capsule Take 1 capsule (100 mg total) by mouth 2 (two) times daily. (Patient not taking: Reported on 11/11/2021)   No current facility-administered medications for this visit. (Other)   REVIEW OF SYSTEMS: ROS   Positive for: Musculoskeletal, Eyes, Respiratory Negative for: Constitutional, Gastrointestinal, Neurological, Skin, Genitourinary, HENT, Endocrine, Cardiovascular, Psychiatric, Allergic/Imm, Heme/Lymph Last edited by SimKingsley SpittleOT on 05/14/2022  9:43 AM.     ALLERGIES Allergies  Allergen Reactions   Ciprofloxacin Itching and Swelling    Swelling lips   Indomethacin Other (See Comments)    Felt chest pain, possible esophageal spasm   Ofloxacin Itching and Swelling   Adhesive [Tape] Rash    Tapes cause blister, need to use paper tape    PAST MEDICAL HISTORY Past Medical History:  Diagnosis Date   Contact lens/glasses fitting    wears one contact lt eye   Heterozygous factor V Leiden  mutation ([place])    Osteopenia    PONV (postoperative nausea and vomiting)    Superficial vein thrombosis 1997   Varicose veins of both lower extremities    Past Surgical History:  Procedure Laterality Date   BARTHOLIN CYST MARSUPIALIZATION     BARTHOLIN CYST MARSUPIALIZATION Right 08/19/2014   Procedure: BARTHOLIN CYST MARSUPIALIZATION;  Surgeon: Megan Salon, MD;  Location: [facility];  Service: Gynecology;  Laterality: Right;   CARPAL TUNNEL RELEASE Right 12/20/2012   Procedure: CARPAL TUNNEL RELEASE;  Surgeon: Wynonia Sours, MD;  Location: [facility];   Service: Orthopedics;  Laterality: Right;   CARPOMETACARPEL SUSPENSION PLASTY Right 05/29/2019   Procedure: CARPOMETACARPEL [facility]) SUSPENSION PLASTY excision trapexzium tendon transfer, microlink suspection plasty;  Surgeon: Daryll Brod, MD;  Location: [facility];  Service: Orthopedics;  Laterality: Right;  axillary block   CATARACT EXTRACTION Right 2009       CESAREAN SECTION  1973, 1977   x2   EAR CYST EXCISION Left 08/15/2014   Procedure: LEFT THUMB DEBRIDEMENT INTERPHALANGEAL JOINT LEFT THUMB REMOVAL MUCOID CYST ;  Surgeon: Daryll Brod, MD;  Location: [facility];  Service: Orthopedics;  Laterality: Left;   FINGER SURGERY  2002   lt long finger mass   GANGLION CYST EXCISION Left 2/02   TOTAL ABDOMINAL HYSTERECTOMY W/ BILATERAL SALPINGOOPHORECTOMY  1997   TUBAL LIGATION  1977   FAMILY HISTORY Family History  Problem Relation Age of Onset   Heart disease Mother    Hyperlipidemia Mother    Thyroid disease Mother    Osteoporosis Mother    Dementia Mother    Breast cancer Mother 70   Diabetes Father    Heart disease Father    Cancer Father        prostate   Breast cancer Daughter 65   Cancer Brother        Eye   Cancer Brother        gleoblastoma   SOCIAL HISTORY Social History   Tobacco Use   Smoking status: Never   Smokeless tobacco: Never  Vaping Use   Vaping Use: Never used  Substance Use Topics   Alcohol use: Yes    Comment: glass of wine weeklu   Drug use: No       OPHTHALMIC EXAM:  Base Eye Exam     Visual Acuity (Snellen - Linear)       Right Left   Dist cc 20/25 +2 20/15   Dist ph cc 20/20     Correction: Glasses         Tonometry (Tonopen, 9:46 AM)       Right Left   Pressure 14 13         Pupils       Pupils Dark Light Shape React APD   Right PERRL 3 2 Round Brisk None   Left PERRL 3 2 Round Brisk None         Visual Fields (Counting fingers)       Left Right    Full Full         Extraocular  Movement       Right Left    Full, Ortho Full, Ortho         Neuro/Psych     Oriented x3: Yes   Mood/Affect: Normal         Dilation     Both eyes: 1.0% Mydriacyl, 2.5% Phenylephrine @ 9:47 AM  Slit Lamp and Fundus Exam     Slit Lamp Exam       Right Left   Lids/Lashes Dermatochalasis - upper lid, Meibomian gland dysfunction Dermatochalasis - upper lid, Meibomian gland dysfunction   Conjunctiva/Sclera White and quiet White and quiet   Cornea mild arcus, 2+ fine Punctate epithelial erosions, well healed cataract wound mild arcus, 2+ fine Punctate epithelial erosions, well healed cataract wound, mild tear film debris   Anterior Chamber deep and clear Deep and quiet   Iris Round and dilated Round and dilated   Lens PC IOL in good position PC IOL in good position, 1+ non-central Posterior capsular opacification   Anterior Vitreous clear Vitreous syneresis         Fundus Exam       Right Left   Disc Pink and Sharp Pink and Sharp   C/D Ratio 0.4 0.4   Macula Flat, good foveal reflex, mild RPE mottling, No heme or edema Flat, Blunted foveal reflex, RPE mottling and clumping, No heme or edema   Vessels mild attenuation, mild tortuosity mild attenuation, mild tortuosity   Periphery Attached, no heme Attached, amelanotic nevus with overlying pigment clumping, no SRF, no orange pigment, approx 4x5 DD -- just outside distal ST arcades           Refraction     Wearing Rx       Sphere Cylinder Axis Add   Right -0.50 +0.75 050 +2.00   Left -0.25 +0.25 032 +2.00            IMAGING AND PROCEDURES  Imaging and Procedures for 05/14/2022  OCT, Retina - OU - Both Eyes       Right Eye Quality was good. Central Foveal Thickness: 282. Progression has been stable. Findings include normal foveal contour, no IRF, no SRF.   Left Eye Quality was good. Central Foveal Thickness: 274. Progression has been stable. Findings include normal foveal contour, no IRF, no  SRF, pigment epithelial detachment (Mildly elevated hyper reflective choroidal lesion with overlying drusen and PED; no SRF).   Notes *Images captured and stored on drive  Diagnosis / Impression:  OD: NFP, no IRF/SRF  OS: Mildly elevated hyper reflective choroidal lesion with overlying drusen and PED; no SRF  Clinical management:  See below  Abbreviations: NFP - Normal foveal profile. CME - cystoid macular edema. PED - pigment epithelial detachment. IRF - intraretinal fluid. SRF - subretinal fluid. EZ - ellipsoid zone. ERM - epiretinal membrane. ORA - outer retinal atrophy. ORT - outer retinal tubulation. SRHM - subretinal hyper-reflective material. IRHM - intraretinal hyper-reflective material      Fluorescein Angiography Optos (Transit OS)       Right Eye Progression has been stable. Early phase findings include normal observations. Mid/Late phase findings include normal observations.   Left Eye Progression has been stable. Early phase findings include staining, window defect. Mid/Late phase findings include staining, window defect (Focal staining / window defect of choroidal lesion just outside ST arcades).   Notes **Images stored on drive**  Impression: OD: normal study OS: choroidal lesion with early blockage and late staining just outside ST arcades -- no significant change from prior            ASSESSMENT/PLAN:    ICD-10-CM   1. 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 Pseudophakia  Z96.1      1. Choroidal Nevus,  OS  - mostly amelanotic choroidal lesion just outside distal ST arcades ~4x5DD in size  - no visual symptoms with BCVA 20/15 OS  - +pigment clumping and drusen, no SRF, no orange pigment  - thickness < 84m  - pt reports brother with history of choroidal melanoma, s/p enucleation, who died ~10 yrs after enucleation from liver mets  - OCT shows Mildly elevated hyper reflective  choroidal lesion with overlying drusen and PED; no SRF   - FA (09.06.22) shows choroidal lesion with early blockage and late staining just outside ST arcades  - repeat FA 01.05.24 unchanged from prior  - discussed findings, prognosis, and treatment options  - [facility]   - f/u here in 1 year, w/DFE/OCT  3. Pseudophakia OU  - s/p CE/IOL (Dr. HHerbert Deaner  - IOLs in good position, doing well  - monitor   Ophthalmic Meds Ordered this visit:  No orders of the defined types were placed in this encounter.    Return in about 1 year (around 05/15/2023) for f/u choroidal nevus OS, DFE, OCT.  There are no Patient Instructions on file for this visit.  This document serves as a record of services personally performed by BGardiner Sleeper MD, PhD. It was created on their behalf by BBernarda Caffey MD, an ophthalmic technician. The creation of this record is the provider's dictation and/or activities during the visit.    Electronically signed by: BBernarda Caffey MD 05/14/2022 12:56 PM  BGardiner Sleeper M.D., Ph.D. Diseases & Surgery of the Retina and Vitreous [facility] I have reviewed the above documentation for accuracy and completeness, and I agree with the above. BGardiner Sleeper M.D., Ph.D. 05/14/22 12:58 PM  Abbreviations: M myopia (nearsighted); A astigmatism; H hyperopia (farsighted); P presbyopia; Mrx spectacle prescription;  CTL contact lenses; OD right eye; OS left eye; OU both eyes  XT exotropia; ET esotropia; PEK punctate epithelial keratitis; PEE punctate epithelial erosions; DES dry eye syndrome; MGD meibomian gland dysfunction; ATs artificial tears; PFAT's preservative free artificial tears; [place] sclerotic cataract; PSC posterior subcapsular cataract; ERM epi-retinal membrane; PVD posterior vitreous detachment; RD retinal detachment; DM diabetes mellitus; DR diabetic retinopathy; NPDR non-proliferative diabetic retinopathy; PDR  proliferative diabetic retinopathy; CSME clinically significant macular edema; DME diabetic macular edema; dbh dot blot hemorrhages; CWS cotton wool spot; POAG primary open angle glaucoma; C/D cup-to-disc ratio; HVF humphrey visual field; GVF goldmann visual field; OCT optical coherence tomography; IOP intraocular pressure; BRVO Branch retinal vein occlusion; CRVO central retinal vein occlusion; CRAO central retinal artery occlusion; BRAO branch retinal artery occlusion; RT retinal tear; SB scleral buckle; PPV pars plana vitrectomy; VH Vitreous hemorrhage; PRP panretinal laser photocoagulation; IVK intravitreal kenalog; VMT vitreomacular traction; MH Macular hole;  NVD neovascularization of the disc; NVE neovascularization elsewhere; AREDS age related eye disease study; ARMD age related macular degeneration; POAG primary open angle glaucoma; EBMD epithelial/anterior basement membrane dystrophy; ACIOL anterior chamber intraocular lens; IOL intraocular lens; PCIOL posterior chamber intraocular lens; Phaco/IOL phacoemulsification with intraocular lens placement; PGoshenphotorefractive keratectomy; LASIK laser assisted in situ keratomileusis; HTN hypertension; DM diabetes mellitus; COPD chronic obstructive pulmonary disease

## 2022-05-14 ENCOUNTER — Inpatient Hospital Stay: Payer: PPO | Attending: Hematology & Oncology

## 2022-05-14 ENCOUNTER — Ambulatory Visit (INDEPENDENT_AMBULATORY_CARE_PROVIDER_SITE_OTHER): Payer: PPO | Admitting: Ophthalmology

## 2022-05-14 ENCOUNTER — Encounter: Payer: Self-pay | Admitting: Family

## 2022-05-14 ENCOUNTER — Encounter (INDEPENDENT_AMBULATORY_CARE_PROVIDER_SITE_OTHER): Payer: Self-pay | Admitting: Ophthalmology

## 2022-05-14 ENCOUNTER — Inpatient Hospital Stay (HOSPITAL_BASED_OUTPATIENT_CLINIC_OR_DEPARTMENT_OTHER): Payer: PPO | Admitting: Family

## 2022-05-14 VITALS — BP 135/67 | HR 68 | Temp 97.8°F | Resp 17 | Wt 149.8 lb

## 2022-05-14 DIAGNOSIS — Z961 Presence of intraocular lens: Secondary | ICD-10-CM

## 2022-05-14 DIAGNOSIS — R760 Raised antibody titer: Secondary | ICD-10-CM

## 2022-05-14 DIAGNOSIS — Z7901 Long term (current) use of anticoagulants: Secondary | ICD-10-CM | POA: Diagnosis not present

## 2022-05-14 DIAGNOSIS — D6859 Other primary thrombophilia: Secondary | ICD-10-CM

## 2022-05-14 DIAGNOSIS — D3132 Benign neoplasm of left choroid: Secondary | ICD-10-CM

## 2022-05-14 DIAGNOSIS — Z86718 Personal history of other venous thrombosis and embolism: Secondary | ICD-10-CM | POA: Insufficient documentation

## 2022-05-14 DIAGNOSIS — D6861 Antiphospholipid syndrome: Secondary | ICD-10-CM | POA: Diagnosis present

## 2022-05-14 DIAGNOSIS — I8000 Phlebitis and thrombophlebitis of superficial vessels of unspecified lower extremity: Secondary | ICD-10-CM | POA: Diagnosis not present

## 2022-05-14 DIAGNOSIS — D6851 Activated protein C resistance: Secondary | ICD-10-CM

## 2022-05-14 LAB — CBC WITH DIFFERENTIAL (CANCER CENTER ONLY)
Abs Immature Granulocytes: 0.02 10*3/uL (ref 0.00–0.07)
Basophils Absolute: 0 10*3/uL (ref 0.0–0.1)
Basophils Relative: 1 %
Eosinophils Absolute: 0.1 10*3/uL (ref 0.0–0.5)
Eosinophils Relative: 1 %
HCT: 41 % (ref 36.0–46.0)
Hemoglobin: 13.6 g/dL (ref 12.0–15.0)
Immature Granulocytes: 0 %
Lymphocytes Relative: 41 %
Lymphs Abs: 2.3 10*3/uL (ref 0.7–4.0)
MCH: 31.9 pg (ref 26.0–34.0)
MCHC: 33.2 g/dL (ref 30.0–36.0)
MCV: 96.2 fL (ref 80.0–100.0)
Monocytes Absolute: 0.3 10*3/uL (ref 0.1–1.0)
Monocytes Relative: 5 %
Neutro Abs: 3 10*3/uL (ref 1.7–7.7)
Neutrophils Relative %: 52 %
Platelet Count: 219 10*3/uL (ref 150–400)
RBC: 4.26 MIL/uL (ref 3.87–5.11)
RDW: 12.1 % (ref 11.5–15.5)
WBC Count: 5.7 10*3/uL (ref 4.0–10.5)
nRBC: 0 % (ref 0.0–0.2)

## 2022-05-14 LAB — CMP (CANCER CENTER ONLY)
ALT: 17 U/L (ref 0–44)
AST: 20 U/L (ref 15–41)
Albumin: 4.4 g/dL (ref 3.5–5.0)
Alkaline Phosphatase: 72 U/L (ref 38–126)
Anion gap: 7 (ref 5–15)
BUN: 17 mg/dL (ref 8–23)
CO2: 30 mmol/L (ref 22–32)
Calcium: 9.6 mg/dL (ref 8.9–10.3)
Chloride: 105 mmol/L (ref 98–111)
Creatinine: 0.91 mg/dL (ref 0.44–1.00)
GFR, Estimated: >60 mL/min (ref >60)
Glucose, Bld: 147 mg/dL — ABNORMAL HIGH (ref 70–99)
Potassium: 4.1 mmol/L (ref 3.5–5.1)
Sodium: 142 mmol/L (ref 135–145)
Total Bilirubin: 0.4 mg/dL (ref 0.3–1.2)
Total Protein: 7.1 g/dL (ref 6.5–8.1)

## 2022-05-14 MED ORDER — RIVAROXABAN 10 MG PO TABS: 10.0000 mg | ORAL_TABLET | Freq: Every day | ORAL | 2 refills | Status: DC

## 2022-05-14 NOTE — Progress Notes (Signed)
Hematology and Oncology Follow Up Visit  [name] [id number] 24-Mar-1946 77 y.o. 05/14/2022   Principle Diagnosis:  Factor V Leiden Remote history of left lower extremity superficial thrombus Continued right superficial thrombus involving the great saphenous vein, now greater that 10 cm from the saphenofemoral junction  Superficial thrombus involving the right thigh varicosities, proximal and mid small saphenous veins   Current Therapy:        Xarelto 20 mg PO daily - reduced to 10 mg PO daily on 05/14/2022   Interim History:  [name] is here today with her husband for follow-up. She is doing quite well and has no complaints at this time.  She has tolerated Xarelto nicely and has had no issues with blood loss. No abnormal bruising or petechiae.  No fever, chills, n/v, cough, rash, dizziness, SOB, chest pain, palpitations, abdominal pain or changes in bowel or bladder habits.  No swelling, tenderness, numbness or tingling in her extremities.  No falls or syncope.  Appetite and hydration are good. Weight is stable at 149 lbs.   ECOG Performance Status: 1 - Symptomatic but completely ambulatory  Medications:  Allergies as of 05/14/2022       Reactions   Ciprofloxacin Itching, Swelling   Swelling lips   Indomethacin Other (See Comments)   Felt chest pain, possible esophageal spasm   Ofloxacin Itching, Swelling   Adhesive [tape] Rash   Tapes cause blister, need to use paper tape        Medication List        Accurate as of May 14, 2022  1:05 PM. If you have any questions, ask your nurse or doctor.          acetaminophen 500 MG tablet Commonly known as: TYLENOL Take 500 mg by mouth every 4 (four) hours as needed for mild pain.   APPLE CIDER VINEGAR PO Take by mouth.   ascorbic acid 500 MG tablet Commonly known as: VITAMIN C Take 500 mg by mouth daily.   calcium carbonate 500 MG chewable tablet Commonly known as: TUMS - dosed in mg elemental calcium Chew 1  tablet by mouth as needed for indigestion or heartburn.   calcium carbonate 600 MG Tabs tablet Commonly known as: OS-CAL Take 600 mg by mouth 2 (two) times daily with a meal.   cholecalciferol 25 MCG (1000 UNIT) tablet Commonly known as: VITAMIN D3 Take 1,000 Units by mouth.   CHOLESTOFF PO Take by mouth daily.   famotidine 20 MG tablet Commonly known as: PEPCID Take 20 mg by mouth 2 (two) times daily.   fish oil-omega-3 fatty acids 1000 MG capsule Take 2 g by mouth daily.   FLAX SEED OIL PO Take 1 capsule by mouth daily.   multivitamin with minerals tablet Take 1 tablet by mouth daily.   nitrofurantoin (macrocrystal-monohydrate) 100 MG capsule Commonly known as: MACROBID Take 1 capsule (100 mg total) by mouth 2 (two) times daily.   NONFORMULARY OR COMPOUNDED ITEM Estradiol cream 0.02% in 80m prefilled syringes.  117mpv twice weekly.  #98m19monthpply.   Xarelto 20 MG Tabs tablet Generic drug: rivaroxaban TAKE 1 TABLET(20 MG) BY MOUTH DAILY AT 6 AM        Allergies:  Allergies  Allergen Reactions   Ciprofloxacin Itching and Swelling    Swelling lips   Indomethacin Other (See Comments)    Felt chest pain, possible esophageal spasm   Ofloxacin Itching and Swelling   Adhesive [Tape] Rash    Tapes cause  blister, need to use paper tape     Past Medical History, Surgical history, Social history, and Family History were reviewed and updated.  Review of Systems: All other 10 point review of systems is negative.   Physical Exam:  vitals were not taken for this visit.   Wt Readings from Last 3 Encounters:  11/11/21 148 lb 6.4 oz (67.3 kg)  08/11/21 147 lb (66.7 kg)  07/14/21 148 lb (67.1 kg)    Ocular: Sclerae unicteric, pupils equal, round and reactive to light Ear-nose-throat: Oropharynx clear, dentition fair Lymphatic: No cervical or supraclavicular adenopathy Lungs no rales or rhonchi, good excursion bilaterally Heart regular rate and rhythm, no murmur  appreciated Abd soft, nontender, positive bowel sounds MSK no focal spinal tenderness, no joint edema Neuro: non-focal, well-oriented, appropriate affect Breasts: Deferred   Lab Results  Component Value Date   WBC 6.8 11/11/2021   HGB 14.4 11/11/2021   HCT 42.5 11/11/2021   MCV 95.5 11/11/2021   PLT 200 11/11/2021   No results found for: "FERRITIN", "IRON", "TIBC", "UIBC", "IRONPCTSAT" Lab Results  Component Value Date   RBC 4.45 11/11/2021   No results found for: "KPAFRELGTCHN", "LAMBDASER", "KAPLAMBRATIO" No results found for: "IGGSERUM", "IGA", "IGMSERUM" No results found for: "TOTALPROTELP", "ALBUMINELP", "A1GS", "A2GS", "BETS", "BETA2SER", "GAMS", "MSPIKE", "SPEI"   Chemistry      Component Value Date/Time   NA 141 11/11/2021 1006   K 4.4 11/11/2021 1006   CL 105 11/11/2021 1006   CO2 29 11/11/2021 1006   BUN 20 11/11/2021 1006   CREATININE 0.99 11/11/2021 1006   CREATININE 0.75 12/04/2015 1517      Component Value Date/Time   CALCIUM 10.2 11/11/2021 1006   ALKPHOS 81 11/11/2021 1006   AST 20 11/11/2021 1006   ALT 16 11/11/2021 1006   BILITOT 0.3 11/11/2021 1006       Impression and Plan: [name] is a very pleasant 77 yo caucasian female with significant history of thrombotic events starting 25 years ago with a left lower extremity superficial thrombus post hysterectomy and long car ride. She has factor V Leiden and was positive lupus anticoagulant.  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 She is doing well on Xarelto and will continue her same regimen.  Follow-up in 6 months.   Lottie Dawson, NP 1/5/20241:05 PM

## 2022-05-17 LAB — LUPUS ANTICOAGULANT PANEL
DRVVT: 120 s — ABNORMAL HIGH (ref 0.0–47.0)
PTT Lupus Anticoagulant: 47.9 s — ABNORMAL HIGH (ref 0.0–43.5)

## 2022-05-17 LAB — HEXAGONAL PHASE PHOSPHOLIPID: Hexagonal Phase Phospholipid: 6 s (ref 0–11)

## 2022-05-17 LAB — PTT-LA MIX: PTT-LA Mix: 43 s — ABNORMAL HIGH (ref 0.0–40.5)

## 2022-05-17 LAB — DRVVT CONFIRM: dRVVT Confirm: 1.6 ratio — ABNORMAL HIGH (ref 0.8–1.2)

## 2022-05-17 LAB — DRVVT MIX: dRVVT Mix: 86.4 s — ABNORMAL HIGH (ref 0.0–40.4)

## 2022-05-28 ENCOUNTER — Ambulatory Visit
Admission: RE | Admit: 2022-05-28 | Discharge: 2022-05-28 | Disposition: A | Discharge status: Home / Self Care | Payer: PPO | Source: Ambulatory Visit | Attending: Sports Medicine | Admitting: Sports Medicine

## 2022-05-28 DIAGNOSIS — M48061 Spinal stenosis, lumbar region without neurogenic claudication: Secondary | ICD-10-CM | POA: Diagnosis not present

## 2022-05-28 DIAGNOSIS — M4316 Spondylolisthesis, lumbar region: Secondary | ICD-10-CM | POA: Diagnosis not present

## 2022-05-28 DIAGNOSIS — M545 Low back pain, unspecified: Secondary | ICD-10-CM

## 2022-06-21 DIAGNOSIS — M47816 Spondylosis without myelopathy or radiculopathy, lumbar region: Secondary | ICD-10-CM | POA: Diagnosis not present

## 2022-06-24 DIAGNOSIS — H40013 Open angle with borderline findings, low risk, bilateral: Secondary | ICD-10-CM | POA: Diagnosis not present

## 2022-07-13 DIAGNOSIS — M47816 Spondylosis without myelopathy or radiculopathy, lumbar region: Secondary | ICD-10-CM | POA: Diagnosis not present

## 2022-07-19 ENCOUNTER — Ambulatory Visit (INDEPENDENT_AMBULATORY_CARE_PROVIDER_SITE_OTHER): Payer: PPO | Admitting: Obstetrics & Gynecology

## 2022-07-19 ENCOUNTER — Encounter (HOSPITAL_BASED_OUTPATIENT_CLINIC_OR_DEPARTMENT_OTHER): Payer: Self-pay | Admitting: Obstetrics & Gynecology

## 2022-07-19 VITALS — BP 131/68 | HR 66 | Ht 65.5 in | Wt 149.0 lb

## 2022-07-19 DIAGNOSIS — N390 Urinary tract infection, site not specified: Secondary | ICD-10-CM | POA: Diagnosis not present

## 2022-07-19 DIAGNOSIS — M8588 Other specified disorders of bone density and structure, other site: Secondary | ICD-10-CM

## 2022-07-19 DIAGNOSIS — Z01419 Encounter for gynecological examination (general) (routine) without abnormal findings: Secondary | ICD-10-CM

## 2022-07-19 DIAGNOSIS — D6851 Activated protein C resistance: Secondary | ICD-10-CM | POA: Diagnosis not present

## 2022-07-19 DIAGNOSIS — M8580 Other specified disorders of bone density and structure, unspecified site: Secondary | ICD-10-CM | POA: Diagnosis not present

## 2022-07-19 MED ORDER — NITROFURANTOIN MONOHYD MACRO 100 MG PO CAPS: 100.0000 mg | ORAL_CAPSULE | Freq: Two times a day (BID) | ORAL | 0 refills | Status: DC

## 2022-07-19 NOTE — Progress Notes (Signed)
77 y.o. G2P2 Married White or Caucasian female here for breast and pelvic exam.  I am also following her for h/o recurrent UTIs.  She has also have bilateral bartholin's marsupialization procedures.  .  H/o right superficial thrombos involving the great saphenous vein.  H/O heterozygous for Factor V Leiden as well as +lupus anticoagulant.  Is on Xarelto and will be on this long term.    Denies vaginal bleeding.  No LMP recorded. Patient has had a hysterectomy.          Sexually active: Yes.    H/O STD:  no  Health Maintenance: PCP:  Dr. Harrington Challenger.  Last wellness appt was March.  Did blood work at that appt: yes Vaccines are up to date:  has not done updated shingles vaccine Colonoscopy:  2017, Dr. Watt Climes.  Follow up 10 years.  MMG:  01/15/2022 Negative BMD:  12/22/2018 Osteopenia Last pap smear:  01/15/2022 Negative.   H/o abnormal pap smear:      reports that she has never smoked. She has never used smokeless tobacco. She reports current alcohol use. She reports that she does not use drugs.  Past Medical History:  Diagnosis Date   Contact lens/glasses fitting    wears one contact lt eye   Family history of breast cancer    daughter, diagnosed at 17   Heterozygous factor V Leiden mutation ([place])    Lupus anticoagulant positive    Osteopenia    PONV (postoperative nausea and vomiting)    Superficial vein thrombosis 1997   Varicose veins of both lower extremities     Past Surgical History:  Procedure Laterality Date   BARTHOLIN CYST MARSUPIALIZATION     BARTHOLIN CYST MARSUPIALIZATION Right 08/19/2014   Procedure: BARTHOLIN CYST MARSUPIALIZATION;  Surgeon: Megan Salon, MD;  Location: [facility];  Service: Gynecology;  Laterality: Right;   CARPAL TUNNEL RELEASE Right 12/20/2012   Procedure: CARPAL TUNNEL RELEASE;  Surgeon: Wynonia Sours, MD;  Location: [place] [facility];  Service: Orthopedics;  Laterality: Right;   CARPOMETACARPEL SUSPENSION PLASTY Right 05/29/2019   Procedure:  CARPOMETACARPEL [facility]) SUSPENSION PLASTY excision trapexzium tendon transfer, microlink suspection plasty;  Surgeon: Daryll Brod, MD;  Location: [place];  Service: Orthopedics;  Laterality: Right;  axillary block   CATARACT EXTRACTION Right 2009       CESAREAN SECTION  1973, 1977   x2   EAR CYST EXCISION Left 08/15/2014   Procedure: LEFT THUMB DEBRIDEMENT INTERPHALANGEAL JOINT LEFT THUMB REMOVAL MUCOID CYST ;  Surgeon: Daryll Brod, MD;  Location: [place];  Service: Orthopedics;  Laterality: Left;   FINGER SURGERY  2002   lt long finger mass   GANGLION CYST EXCISION Left 2/02   TOTAL ABDOMINAL HYSTERECTOMY W/ BILATERAL SALPINGOOPHORECTOMY  1997   TUBAL LIGATION  1977    Current Outpatient Medications  Medication Sig Dispense Refill   acetaminophen (TYLENOL) 500 MG tablet Take 500 mg by mouth every 4 (four) hours as needed for mild pain.     APPLE CIDER VINEGAR PO Take by mouth.     calcium carbonate (OS-CAL) 600 MG TABS tablet Take 600 mg by mouth 2 (two) times daily with a meal.     calcium carbonate (TUMS - DOSED IN MG ELEMENTAL CALCIUM) 500 MG chewable tablet Chew 1 tablet by mouth as needed for indigestion or heartburn.     cholecalciferol (VITAMIN D3) 25 MCG (1000 UNIT) tablet Take 1,000 Units by mouth.     famotidine (  PEPCID) 20 MG tablet Take 20 mg by mouth 2 (two) times daily.     fish oil-omega-3 fatty acids 1000 MG capsule Take 2 g by mouth daily.     Flaxseed, Linseed, (FLAX SEED OIL PO) Take 1 capsule by mouth daily.     Multiple Vitamins-Minerals (MULTIVITAMIN WITH MINERALS) tablet Take 1 tablet by mouth daily.     NONFORMULARY OR COMPOUNDED ITEM Estradiol cream 0.02% in 27m prefilled syringes.  161mpv twice weekly.  #74m2monthpply. 1 each 3   Plant Sterols and Stanols (CHOLESTOFF PO) Take by mouth daily.     rivaroxaban (XARELTO) 10 MG TABS tablet Take 1 tablet (10 mg total) by mouth daily. 90 tablet 2   vitamin C (ASCORBIC ACID) 500 MG tablet  Take 500 mg by mouth daily.     nitrofurantoin, macrocrystal-monohydrate, (MACROBID) 100 MG capsule Take 1 capsule (100 mg total) by mouth 2 (two) times daily. 10 capsule 0   No current facility-administered medications for this visit.    Family History  Problem Relation Age of Onset   Heart disease Mother    Hyperlipidemia Mother    Thyroid disease Mother    Osteoporosis Mother    Dementia Mother    Breast cancer Mother 86 55Diabetes Father    Heart disease Father    Cancer Father        prostate   Breast cancer Daughter [phone] 63Can[place] Review of Systems  Constitutional: Negative.   Genitourinary: Negative.     Exam:   BP 131/68 (BP Location: Right Arm, Patient Position: Sitting, Cuff Size: Large)   Pulse 66   Ht 5' 5.5" (1.664 m) Comment: Reported  Wt 149 lb (67.6 kg)   BMI 24.42 kg/m   Height: 5' 5.5" (166.4 cm) (Reported)  General appearance: alert, cooperative and appears stated age Breasts: normal appearance, no masses or tenderness Abdomen: soft, non-tender; bowel sounds normal; no masses,  no organomegaly Lymph nodes: Cervical, supraclavicular, and axillary nodes normal.  No abnormal inguinal nodes palpated Neurologic: Grossly normal  Pelvic: External genitalia:  no lesions              Urethra:  normal appearing urethra with no masses, tenderness or lesions              Bartholins and Skenes: normal                 Vagina: normal appearing vagina with atrophic changes and no discharge, no lesions              Cervix: absent              Pap taken: No. Bimanual Exam:  Uterus:  uterus absent              Adnexa: no mass, fullness, tenderness               Rectovaginal: Confirms               Anus:  normal sphincter tone, no lesions  Chaperone, TonOctaviano BattyMA, was present for exam.  Assessment/Plan: 1. Encntr for gyn exam (general) (routine) w/o abn findings - Pap smear not indicated -  Mammogram 01/2022 - Colonoscopy 2017 with Dr. MagWatt ClimesBone mineral density ordered today - lab work done with PCP, Dr. RosHarrington Challengervaccines reviewed/updated  2. Recurrent UTI - pt needs rx to have on hand if has symptoms - nitrofurantoin, macrocrystal-monohydrate, (MACROBID) 100 MG capsule; Take 1 capsule (100 mg total) by mouth 2 (two) times daily.  Dispense: 10 capsule; Refill: 0 - pt will let me know when needs estradiol cream RF and has discussed with hematology and they are ok with continuing this  5. Osteopenia of lumbar spine - DEXA ordered for this year  4. Heterozygous factor V Leiden mutation [facility]) - followed by Dr. Martha Clan

## 2022-07-21 DIAGNOSIS — L570 Actinic keratosis: Secondary | ICD-10-CM | POA: Diagnosis not present

## 2022-07-21 DIAGNOSIS — D485 Neoplasm of uncertain behavior of skin: Secondary | ICD-10-CM | POA: Diagnosis not present

## 2022-07-27 DIAGNOSIS — U071 COVID-19: Secondary | ICD-10-CM | POA: Diagnosis not present

## 2022-08-09 DIAGNOSIS — Z0000 Encounter for general adult medical examination without abnormal findings: Secondary | ICD-10-CM | POA: Diagnosis not present

## 2022-08-09 DIAGNOSIS — Z131 Encounter for screening for diabetes mellitus: Secondary | ICD-10-CM | POA: Diagnosis not present

## 2022-08-09 DIAGNOSIS — D6851 Activated protein C resistance: Secondary | ICD-10-CM | POA: Diagnosis not present

## 2022-08-09 DIAGNOSIS — Z6824 Body mass index (BMI) 24.0-24.9, adult: Secondary | ICD-10-CM | POA: Diagnosis not present

## 2022-08-09 DIAGNOSIS — R059 Cough, unspecified: Secondary | ICD-10-CM | POA: Diagnosis not present

## 2022-08-09 DIAGNOSIS — E7800 Pure hypercholesterolemia, unspecified: Secondary | ICD-10-CM | POA: Diagnosis not present

## 2022-08-09 DIAGNOSIS — D6869 Other thrombophilia: Secondary | ICD-10-CM | POA: Diagnosis not present

## 2022-08-10 DIAGNOSIS — M47816 Spondylosis without myelopathy or radiculopathy, lumbar region: Secondary | ICD-10-CM | POA: Diagnosis not present

## 2022-09-24 ENCOUNTER — Encounter: Payer: Self-pay | Admitting: Internal Medicine

## 2022-09-24 ENCOUNTER — Ambulatory Visit (INDEPENDENT_AMBULATORY_CARE_PROVIDER_SITE_OTHER): Payer: PPO | Admitting: Internal Medicine

## 2022-09-24 VITALS — BP 118/72 | HR 74 | Temp 98.2°F | Ht 65.5 in | Wt 148.8 lb

## 2022-09-24 DIAGNOSIS — J4520 Mild intermittent asthma, uncomplicated: Secondary | ICD-10-CM | POA: Diagnosis not present

## 2022-09-24 DIAGNOSIS — R053 Chronic cough: Secondary | ICD-10-CM

## 2022-09-24 DIAGNOSIS — K219 Gastro-esophageal reflux disease without esophagitis: Secondary | ICD-10-CM

## 2022-09-24 LAB — NITRIC OXIDE: FeNO level (ppb): 12

## 2022-09-24 MED ORDER — ALBUTEROL SULFATE HFA 108 (90 BASE) MCG/ACT IN AERS: 2.0000 | INHALATION_SPRAY | Freq: Four times a day (QID) | RESPIRATORY_TRACT | 3 refills | Status: DC | PRN

## 2022-09-24 NOTE — Progress Notes (Signed)
[name]    [id number]    1946/02/22  Primary Care Physician:Ross, Darlen Round, MD  Referring Physician: Daisy Floro, MD [address] [place],  [place] [address] Reason for Consultation: coughing and wheezing Date of Consultation: 09/24/2022  Chief complaint:   Chief Complaint  Patient presents with   Consult    Pt states she has had complaints of coughing and also has had some wheezing associated. States her cough is worse in the mornings but she does cough occasionally throughout the day and also at night.     HPI:  [name] is a 77 y.o. woman with history of dvt and lupus anticoagulant ab positive on xarelto who presents for new patient evaluation for cough and shortness of breath.   She notes chronic cough attributed to acid reflux. It has worsened with a "honking" quality. She has been on pepcid for a long time. Her PCP increased her to PPI for two weeks and no improvement in cough.   She feels this cough quality is different, feels deeper. Usually worse in the morning, occasionally in the evening. Sometimes worse outside, when cleaning the front porch. IT is accompanied with shortness of breath. She has frequent throat clearing.   She has occasional clear mucus production.   She has had intermittent, infrequent episodes of bronchitis over the years. She was seen in 2009 by Dr. Delton Coombes after event of foreign body aspiration with a pill and felt to have mild intermittent asthma with normal PFTs. She was disharged to use albuterol prn. She coughs with eating and feels she aspirates easily.    Coughing at night is waking her up. (This she attributes to a different quality of cough.)  She denies post nasal drainage. She denies seasonal allergies.   Social history:  Occupation: was a Runner, broadcasting/film/video at one point.  Exposures: lives at home with husband.  Smoking history: never smoker.   Social History   Occupational History   Not on file  Tobacco Use    Smoking status: Never   Smokeless tobacco: Never  Vaping Use   Vaping Use: Never used  Substance and Sexual Activity   Alcohol use: Yes    Comment: glass of wine weeklu   Drug use: No   Sexual activity: Not on file    Comment: Hysterectomy    Relevant family history:  Family History  Problem Relation Age of Onset   Heart disease Mother    Hyperlipidemia Mother    Thyroid disease Mother    Osteoporosis Mother    Dementia Mother    Breast cancer Mother 43   Scleroderma Mother    Scleroderma Father    Diabetes Father    Heart disease Father    Cancer Father        prostate   Cancer Brother        Eye   Cancer Brother        gleoblastoma   Breast cancer Daughter 62    Past Medical History:  Diagnosis Date   Contact lens/glasses fitting    wears one contact lt eye   Family history of breast cancer    daughter, diagnosed at 58   Heterozygous factor V Leiden mutation ([facility])    Lupus anticoagulant positive    Osteopenia    PONV (postoperative nausea and vomiting)    Superficial vein thrombosis 1997   Varicose veins of both lower extremities     Past  Surgical History:  Procedure Laterality Date   BARTHOLIN CYST MARSUPIALIZATION     BARTHOLIN CYST MARSUPIALIZATION Right 08/19/2014   Procedure: BARTHOLIN CYST MARSUPIALIZATION;  Surgeon: Jerene Bears, MD;  Location: [facility];  Service: Gynecology;  Laterality: Right;   CARPAL TUNNEL RELEASE Right 12/20/2012   Procedure: CARPAL TUNNEL RELEASE;  Surgeon: Nicki Reaper, MD;  Location: [facility];  Service: Orthopedics;  Laterality: Right;   CARPOMETACARPEL SUSPENSION PLASTY Right 05/29/2019   Procedure: CARPOMETACARPEL [facility]) SUSPENSION PLASTY excision trapexzium tendon transfer, microlink suspection plasty;  Surgeon: Cindee Salt, MD;  Location: [facility];  Service: Orthopedics;  Laterality: Right;  axillary block   CATARACT EXTRACTION Right 2009       CESAREAN SECTION  1973, 1977   x2   EAR CYST  EXCISION Left 08/15/2014   Procedure: LEFT THUMB DEBRIDEMENT INTERPHALANGEAL JOINT LEFT THUMB REMOVAL MUCOID CYST ;  Surgeon: Cindee Salt, MD;  Location: [facility];  Service: Orthopedics;  Laterality: Left;   FINGER SURGERY  2002   lt long finger mass   GANGLION CYST EXCISION Left 2/02   TOTAL ABDOMINAL HYSTERECTOMY W/ BILATERAL SALPINGOOPHORECTOMY  1997   TUBAL LIGATION  1977     Physical Exam: Blood pressure 118/72, pulse 74, temperature 98.2 F (36.8 C), temperature source Oral, height 5' 5.5" (1.664 m), weight 148 lb 12.8 oz (67.5 kg), SpO2 97 %. Gen:      No acute distress, occasional dry cough, frequent throat clearing ENT:  mallampati IV, small oropharynx anterior airway, no nasal polyps, mucus membranes moist Lungs:    No increased respiratory effort, symmetric chest wall excursion, clear to auscultation bilaterally, no wheezes or crackles CV:         Regular rate and rhythm; no murmurs, rubs, or gallops.  No pedal edema Abd:      + bowel sounds; soft, non-tender; no distension MSK: no acute synovitis of DIP or PIP joints, no mechanics hands.  Skin:      Warm and dry; no rashes Neuro: normal speech, no focal facial asymmetry Psych: alert and oriented x3, normal mood and affect   Data Reviewed/Medical Decision Making:  Independent interpretation of tests: Imaging:  PFTs: I have personally reviewed the patient's PFTs and saw Dr. Delton Coombes in 2009 and had normal pulmonary function.   Labs:  Lab Results  Component Value Date   NA 142 05/14/2022   K 4.1 05/14/2022   CO2 30 05/14/2022   GLUCOSE 147 (H) 05/14/2022   BUN 17 05/14/2022   CREATININE 0.91 05/14/2022   CALCIUM 9.6 05/14/2022   GFRNONAA >60 05/14/2022   Lab Results  Component Value Date   WBC 5.7 05/14/2022   HGB 13.6 05/14/2022   HCT 41.0 05/14/2022   MCV 96.2 05/14/2022   PLT 219 05/14/2022     Immunization status:  Immunization History  Administered Date(s) Administered   Fluad  Quad(high Dose 65+) 02/13/2019, 02/21/2022   Influenza, High Dose Seasonal PF 02/08/2018, 03/05/2020   PFIZER(Purple Top)SARS-COV-2 Vaccination 06/27/2019, 07/05/2019, 02/14/2020, 11/17/2020   Pneumococcal Conjugate-13 03/14/2014   Pneumococcal Polysaccharide-23 04/09/2015   Tdap 09/06/2005, 05/17/2016, 04/16/2020   Zoster, Live 09/08/2006     I reviewed prior external note(s) from pulmonary, pcp, gyn, vascular surgery  I reviewed the result(s) of the labs and imaging as noted above.   I have ordered feno  Assessment:  Chronic cough Shortness of breath GERD  Plan/Recommendations:  FeNO - 12 ppb.   I suspect your cough is  a combination of acid reflux and could have component of asthma. We will try an albuterol inhaler as needed to see if it improves your symptoms.  Keep taking acid reflux medication. I suspect this is accounting for the majority of symptoms.   Sleeping with a wedge pillow can be helpful.  Pending response referral to ENT or GI may also be helpful.    We discussed disease management and progression at length today.   Return to Care: Return in about 8 weeks (around 11/19/2022).  Durel Salts, MD Pulmonary and Critical Care Medicine [facility] Office:[phone]  CC: Daisy Floro, MD

## 2022-09-24 NOTE — Patient Instructions (Signed)
Please schedule follow up scheduled with myself in 2 months.  If my schedule is not open yet, we will contact you with a reminder closer to that time. Please call [phone] if you haven't heard from [place] a month before.   I suspect your cough is a combination of acid reflux and could have component of asthma. We will try an albuterol inhaler as needed to see if it improves your symptoms. Keep taking acid reflux medication. Sleeping with a wedge pillow can be helpful.   Take the albuterol rescue inhaler every 4 to 6 hours as needed for wheezing, coughing, or shortness of breath. You can also take it 15 minutes before exercise or exertional activity to see if it improves your exercise tolerance. Side effects include heart racing or pounding, jitters or anxiety. If you have a history of an irregular heart rhythm, it can make this worse. Can also give some patients a hard time sleeping.   What is GERD? Gastroesophageal reflux disease (GERD) is gastroesophageal reflux diseasewhich occurs when the lower esophageal sphincter (LES) opens spontaneously, for varying periods of time, or does not close properly and stomach contents rise up into the esophagus. GER is also called acid reflux or acid regurgitation, because digestive juices--called acids--rise up with the food. The esophagus is the tube that carries food from the mouth to the stomach. The LES is a ring of muscle at the bottom of the esophagus that acts like a valve between the esophagus and stomach.  When acid reflux occurs, food or fluid can be tasted in the back of the mouth. When refluxed stomach acid touches the lining of the esophagus it may cause a burning sensation in the chest or throat called heartburn or acid indigestion. Occasional reflux is common. Persistent reflux that occurs more than twice a week is considered GERD, and it can eventually lead to more serious health problems. People of all ages can have GERD. Studies have shown that  GERD may worsen or contribute to asthma, chronic cough, and pulmonary fibrosis.   What are the symptoms of GERD? The main symptom of GERD in adults is frequent heartburn, also called acid indigestion--burning-type pain in the lower part of the mid-chest, behind the breast bone, and in the mid-abdomen.  Not all reflux is acidic in nature, and many patients don't have heart burn at all. Sometimes it feels like a cough (either dry or with mucus), choking sensation, asthma, shortness of breath, waking up at night, frequent throat clearing, or trouble swallowing.    What causes GERD? The reason some people develop GERD is still unclear. However, research shows that in people with GERD, the LES relaxes while the rest of the esophagus is working. Anatomical abnormalities such as a hiatal hernia may also contribute to GERD. A hiatal hernia occurs when the upper part of the stomach and the LES move above the diaphragm, the muscle wall that separates the stomach from the chest. Normally, the diaphragm helps the LES keep acid from rising up into the esophagus. When a hiatal hernia is present, acid reflux can occur more easily. A hiatal hernia can occur in people of any age and is most often a normal finding in otherwise healthy people over age 83. Most of the time, a hiatal hernia produces no symptoms.   Other factors that may contribute to GERD include - Obesity or recent weight gain - Pregnancy  - Smoking  - Diet - Certain medications  Common foods that can worsen reflux  symptoms include: - carbonated beverages - artificial sweeteners - citrus fruits  - chocolate  - drinks with caffeine or alcohol  - fatty and fried foods  - garlic and onions  - mint flavorings  - spicy foods  - tomato-based foods, like spaghetti sauce, salsa, chili, and pizza   Lifestyle Changes If you smoke, stop.  Avoid foods and beverages that worsen symptoms (see above.) Lose weight if needed.  Eat small, frequent meals.   Wear loose-fitting clothes.  Avoid lying down for 3 hours after a meal.  Raise the head of your bed 6 to 8 inches by securing wood blocks under the bedposts. Just using extra pillows will not help, but using a wedge-shaped pillow may be helpful.  Medications  H2 blockers, such as cimetidine (Tagamet HB), famotidine (Pepcid AC), nizatidine (Axid AR), and ranitidine (Zantac 75), decrease acid production. They are available in prescription strength and over-the-counter strength. These drugs provide short-term relief and are effective for about half of those who have GERD symptoms.  Proton pump inhibitors include omeprazole (Prilosec, Zegerid), lansoprazole (Prevacid), pantoprazole (Protonix), rabeprazole (Aciphex), and esomeprazole (Nexium), which are available by prescription. Prilosec is also available in over-the-counter strength. Proton pump inhibitors are more effective than H2 blockers and can relieve symptoms and heal the esophageal lining in almost everyone who has GERD.  Because drugs work in different ways, combinations of medications may help control symptoms. People who get heartburn after eating may take both antacids and H2 blockers. The antacids work first to neutralize the acid in the stomach, and then the H2 blockers act on acid production. By the time the antacid stops working, the H2 blocker will have stopped acid production. Your health care provider is the best source of information about how to use medications for GERD.   Points to Remember 1. You can have GERD without having heartburn. Your symptoms could include a dry cough, asthma symptoms, or trouble swallowing.  2. Taking medications daily as prescribed is important in controlling you symptoms.  Sometimes it can take up to 8 weeks to fully achieve the effects of the medications prescribed.  3. Coughing related to GERD can be difficult to treat and is very frustrating!  However, it is important to stick with these  medications and lifestyle modifications before pursuing more aggressive or invasive test and treatments.

## 2022-09-24 NOTE — Addendum Note (Signed)
Addended by: Wyvonne Lenz on: 09/24/2022 10:19 AM   Modules accepted: Orders

## 2022-11-19 ENCOUNTER — Encounter: Payer: Self-pay | Admitting: Hematology & Oncology

## 2022-11-19 ENCOUNTER — Inpatient Hospital Stay: Payer: PPO | Attending: Hematology & Oncology

## 2022-11-19 ENCOUNTER — Inpatient Hospital Stay: Payer: PPO | Admitting: Hematology & Oncology

## 2022-11-19 VITALS — BP 134/60 | HR 65 | Temp 98.1°F | Resp 20 | Ht 65.5 in | Wt 147.1 lb

## 2022-11-19 DIAGNOSIS — I8000 Phlebitis and thrombophlebitis of superficial vessels of unspecified lower extremity: Secondary | ICD-10-CM

## 2022-11-19 DIAGNOSIS — D6851 Activated protein C resistance: Secondary | ICD-10-CM | POA: Diagnosis not present

## 2022-11-19 DIAGNOSIS — Z7901 Long term (current) use of anticoagulants: Secondary | ICD-10-CM | POA: Diagnosis not present

## 2022-11-19 DIAGNOSIS — Z86718 Personal history of other venous thrombosis and embolism: Secondary | ICD-10-CM | POA: Diagnosis not present

## 2022-11-19 DIAGNOSIS — R760 Raised antibody titer: Secondary | ICD-10-CM

## 2022-11-19 LAB — CMP (CANCER CENTER ONLY)
ALT: 15 U/L (ref 0–44)
AST: 19 U/L (ref 15–41)
Albumin: 4.3 g/dL (ref 3.5–5.0)
Alkaline Phosphatase: 69 U/L (ref 38–126)
Anion gap: 6 (ref 5–15)
BUN: 15 mg/dL (ref 8–23)
CO2: 31 mmol/L (ref 22–32)
Calcium: 9.9 mg/dL (ref 8.9–10.3)
Chloride: 105 mmol/L (ref 98–111)
Creatinine: 0.81 mg/dL (ref 0.44–1.00)
GFR, Estimated: >60 mL/min (ref >60)
Glucose, Bld: 113 mg/dL — ABNORMAL HIGH (ref 70–99)
Potassium: 4 mmol/L (ref 3.5–5.1)
Sodium: 142 mmol/L (ref 135–145)
Total Bilirubin: 0.4 mg/dL (ref 0.3–1.2)
Total Protein: 6.8 g/dL (ref 6.5–8.1)

## 2022-11-19 LAB — CBC WITH DIFFERENTIAL (CANCER CENTER ONLY)
Abs Immature Granulocytes: 0.04 10*3/uL (ref 0.00–0.07)
Basophils Absolute: 0 10*3/uL (ref 0.0–0.1)
Basophils Relative: 1 %
Eosinophils Absolute: 0.1 10*3/uL (ref 0.0–0.5)
Eosinophils Relative: 2 %
HCT: 40.1 % (ref 36.0–46.0)
Hemoglobin: 13.3 g/dL (ref 12.0–15.0)
Immature Granulocytes: 1 %
Lymphocytes Relative: 36 %
Lymphs Abs: 2.1 10*3/uL (ref 0.7–4.0)
MCH: 32 pg (ref 26.0–34.0)
MCHC: 33.2 g/dL (ref 30.0–36.0)
MCV: 96.6 fL (ref 80.0–100.0)
Monocytes Absolute: 0.5 10*3/uL (ref 0.1–1.0)
Monocytes Relative: 8 %
Neutro Abs: 3.1 10*3/uL (ref 1.7–7.7)
Neutrophils Relative %: 52 %
Platelet Count: 180 10*3/uL (ref 150–400)
RBC: 4.15 MIL/uL (ref 3.87–5.11)
RDW: 12.2 % (ref 11.5–15.5)
WBC Count: 5.9 10*3/uL (ref 4.0–10.5)
nRBC: 0 % (ref 0.0–0.2)

## 2022-11-19 NOTE — Progress Notes (Signed)
Hematology and Oncology Follow Up Visit  [name] [id number] 1945-10-06 77 y.o. 11/19/2022   Principle Diagnosis:  Factor V Leiden -heterozygous/lupus anticoagulant  (+) Remote history of left lower extremity superficial thrombus Continued right superficial thrombus involving the great saphenous vein, now greater that 10 cm from the saphenofemoral junction  Superficial thrombus involving the right thigh varicosities, proximal and mid small saphenous veins   Current Therapy:        Xarelto 20 mg PO daily - reduced to 10 mg PO daily on 05/14/2022   Interim History:  Ms. Goodstein is here today for follow-up.  She is seen every 6 months.  So far, she is doing quite well.  She had a very nice July 4 weekend.  Her son and his family came over from [place].  They really enjoyed seeing them.  She has had no problems with the Xarelto.  She is on 10 mg a day now.  She has had no pain in the legs.  Is been no leg swelling.  She has had no bleeding.  There is no change in bowel or bladder habits.  She has had no cough or shortness of breath.  There is been no issues with COVID.      ECOG Performance Status: 1 - Symptomatic but completely ambulatory  Medications:  Allergies as of 11/19/2022       Reactions   Ciprofloxacin Itching, Swelling   Swelling lips   Indomethacin Other (See Comments)   Felt chest pain, possible esophageal spasm   Ofloxacin Itching, Swelling   Adhesive [tape] Rash   Tapes cause blister, need to use paper tape        Medication List        Accurate as of November 19, 2022 10:48 AM. If you have any questions, ask your nurse or doctor.          acetaminophen 500 MG tablet Commonly known as: TYLENOL Take 500 mg by mouth every 4 (four) hours as needed for mild pain.   albuterol 108 (90 Base) MCG/ACT inhaler Commonly known as: VENTOLIN HFA Inhale 2 puffs into the lungs every 6 (six) hours as needed.   ascorbic acid 500 MG tablet Commonly known as: VITAMIN  C Take 500 mg by mouth daily.   calcium carbonate 500 MG chewable tablet Commonly known as: TUMS - dosed in mg elemental calcium Chew 1 tablet by mouth as needed for indigestion or heartburn.   calcium carbonate 600 MG Tabs tablet Commonly known as: OS-CAL Take 600 mg by mouth 2 (two) times daily with a meal.   celecoxib 100 MG capsule Commonly known as: CELEBREX Take 100 mg by mouth daily as needed.   cholecalciferol 25 MCG (1000 UNIT) tablet Commonly known as: VITAMIN D3 Take 1,000 Units by mouth.   CHOLESTOFF PO Take by mouth daily.   famotidine 20 MG tablet Commonly known as: PEPCID Take 20 mg by mouth daily.   fish oil-omega-3 fatty acids 1000 MG capsule Take 2 g by mouth daily.   FLAX SEED OIL PO Take 1 capsule by mouth daily.   Melatonin 10 MG Caps Take by mouth at bedtime as needed.   multivitamin with minerals tablet Take 1 tablet by mouth every other day.   NONFORMULARY OR COMPOUNDED ITEM Estradiol cream 0.02% in 1ml prefilled syringes.  1ml pv twice weekly.  #45month supply.   rivaroxaban 10 MG Tabs tablet Commonly known as: XARELTO Take 1 tablet (10 mg total) by mouth daily.  Allergies:  Allergies  Allergen Reactions   Ciprofloxacin Itching and Swelling    Swelling lips   Indomethacin Other (See Comments)    Felt chest pain, possible esophageal spasm   Ofloxacin Itching and Swelling   Adhesive [Tape] Rash    Tapes cause blister, need to use paper tape     Past Medical History, Surgical history, Social history, and Family History were reviewed and updated.  Review of Systems: Review of Systems  Constitutional: Negative.   HENT: Negative.    Eyes: Negative.   Respiratory: Negative.    Cardiovascular: Negative.   Gastrointestinal: Negative.   Genitourinary: Negative.   Musculoskeletal: Negative.   Skin: Negative.   Neurological: Negative.   Endo/Heme/Allergies: Negative.   Psychiatric/Behavioral: Negative.       Physical  Exam:  height is 5' 5.5" (1.664 m) and weight is 147 lb 1.9 oz (66.7 kg). Her oral temperature is 98.1 F (36.7 C). Her blood pressure is 134/60 and her pulse is 65. Her respiration is 20 and oxygen saturation is 98%.   Wt Readings from Last 3 Encounters:  11/19/22 147 lb 1.9 oz (66.7 kg)  09/24/22 148 lb 12.8 oz (67.5 kg)  07/19/22 149 lb (67.6 kg)    Physical Exam Vitals reviewed.  HENT:     Head: Normocephalic and atraumatic.  Eyes:     Pupils: Pupils are equal, round, and reactive to light.  Cardiovascular:     Rate and Rhythm: Normal rate and regular rhythm.     Heart sounds: Normal heart sounds.  Pulmonary:     Effort: Pulmonary effort is normal.     Breath sounds: Normal breath sounds.  Abdominal:     General: Bowel sounds are normal.     Palpations: Abdomen is soft.  Musculoskeletal:        General: No tenderness or deformity. Normal range of motion.     Cervical back: Normal range of motion.  Lymphadenopathy:     Cervical: No cervical adenopathy.  Skin:    General: Skin is warm and dry.     Findings: No erythema or rash.  Neurological:     Mental Status: She is alert and oriented to person, place, and time.  Psychiatric:        Behavior: Behavior normal.        Thought Content: Thought content normal.        Judgment: Judgment normal.      Lab Results  Component Value Date   WBC 5.9 11/19/2022   HGB 13.3 11/19/2022   HCT 40.1 11/19/2022   MCV 96.6 11/19/2022   PLT 180 11/19/2022   No results found for: "FERRITIN", "IRON", "TIBC", "UIBC", "IRONPCTSAT" Lab Results  Component Value Date   RBC 4.15 11/19/2022   No results found for: "KPAFRELGTCHN", "LAMBDASER", "KAPLAMBRATIO" No results found for: "IGGSERUM", "IGA", "IGMSERUM" No results found for: "TOTALPROTELP", "ALBUMINELP", "A1GS", "A2GS", "BETS", "BETA2SER", "GAMS", "MSPIKE", "SPEI"   Chemistry      Component Value Date/Time   NA 142 11/19/2022 0955   K 4.0 11/19/2022 0955   CL 105 11/19/2022  0955   CO2 31 11/19/2022 0955   BUN 15 11/19/2022 0955   CREATININE 0.81 11/19/2022 0955   CREATININE 0.75 12/04/2015 1517      Component Value Date/Time   CALCIUM 9.9 11/19/2022 0955   ALKPHOS 69 11/19/2022 0955   AST 19 11/19/2022 0955   ALT 15 11/19/2022 0955   BILITOT 0.4 11/19/2022 0955       Impression  and Plan: Ms. Kolasinski is a very pleasant 77 yo caucasian female with significant history of thrombotic events starting 25 years ago with a left lower extremity superficial thrombus post hysterectomy and long car ride.   She has factor V Leiden and was positive for the lupus anticoagulant.  As such, I suspect she will need long-term anticoagulation.  For right now, everything looks quite good.  We will plan to get her back to see [place] in another 6 months.  If all looks good, then we might build to move her appointments out every year.   Josph Macho, MD 7/12/202410:48 AM

## 2022-11-21 LAB — DRVVT CONFIRM: dRVVT Confirm: 1.4 ratio — ABNORMAL HIGH (ref 0.8–1.2)

## 2022-11-21 LAB — LUPUS ANTICOAGULANT PANEL
DRVVT: 113.2 s — ABNORMAL HIGH (ref 0.0–47.0)
PTT Lupus Anticoagulant: 46.8 s — ABNORMAL HIGH (ref 0.0–43.5)

## 2022-11-21 LAB — PTT-LA MIX: PTT-LA Mix: 42.6 s — ABNORMAL HIGH (ref 0.0–40.5)

## 2022-11-21 LAB — DRVVT MIX: dRVVT Mix: 76.1 s — ABNORMAL HIGH (ref 0.0–40.4)

## 2022-11-21 LAB — HEXAGONAL PHASE PHOSPHOLIPID: Hexagonal Phase Phospholipid: 1 s (ref 0–11)

## 2022-11-29 ENCOUNTER — Ambulatory Visit: Payer: PPO

## 2022-11-29 ENCOUNTER — Encounter: Payer: Self-pay | Admitting: Internal Medicine

## 2022-11-29 ENCOUNTER — Ambulatory Visit: Payer: PPO | Admitting: Internal Medicine

## 2022-11-29 VITALS — BP 104/62 | HR 79 | Ht 65.5 in | Wt 148.8 lb

## 2022-11-29 DIAGNOSIS — R0602 Shortness of breath: Secondary | ICD-10-CM

## 2022-11-29 DIAGNOSIS — J4530 Mild persistent asthma, uncomplicated: Secondary | ICD-10-CM | POA: Diagnosis not present

## 2022-11-29 MED ORDER — MOMETASONE FURO-FORMOTEROL FUM 100-5 MCG/ACT IN AERO: 2.0000 | INHALATION_SPRAY | Freq: Two times a day (BID) | RESPIRATORY_TRACT | 0 refills | Status: DC

## 2022-11-29 NOTE — Progress Notes (Signed)
[name]    [id number]    1945-09-09  Primary Care Physician:Ross, Darlen Round, MD Date of Appointment: 11/29/2022 Established Patient Visit  Chief complaint:   Chief Complaint  Patient presents with   Follow-up    Breathing has been some better  ACT: 20     HPI: [name] is a 77 y.o. woman with medical history of reflux and chronic cough - possible asthma.   Interval Updates: Here for follow up. Gave her a trial of albuterol and she had improvement in her cough with albuterol. Episode of coughing was when she went into the attic and was exposed to dust.   She is on pepcid for acid reflux. She had a trial of PPI without improvement previously   Gets sob when she bends down to tie her shoes. Occasionally with exertion.  I have reviewed the patient's family social and past medical history and updated as appropriate.   Past Medical History:  Diagnosis Date   Contact lens/glasses fitting    wears one contact lt eye   Family history of breast cancer    daughter, diagnosed at 68   Heterozygous factor V Leiden mutation (HCC)    Lupus anticoagulant positive    Osteopenia    PONV (postoperative nausea and vomiting)    Superficial vein thrombosis 1997   Varicose veins of both lower extremities     Past Surgical History:  Procedure Laterality Date   BARTHOLIN CYST MARSUPIALIZATION     BARTHOLIN CYST MARSUPIALIZATION Right 08/19/2014   Procedure: BARTHOLIN CYST MARSUPIALIZATION;  Surgeon: Jerene Bears, MD;  Location: [facility];  Service: Gynecology;  Laterality: Right;   CARPAL TUNNEL RELEASE Right 12/20/2012   Procedure: CARPAL TUNNEL RELEASE;  Surgeon: Nicki Reaper, MD;  Location: [facility];  Service: Orthopedics;  Laterality: Right;   CARPOMETACARPEL SUSPENSION PLASTY Right 05/29/2019   Procedure: CARPOMETACARPEL [facility]) SUSPENSION PLASTY excision trapexzium tendon transfer, microlink suspection plasty;  Surgeon: Cindee Salt, MD;  Location: [facility]  [place];  Service: Orthopedics;  Laterality: Right;  axillary block   CATARACT EXTRACTION Right 2009       CESAREAN SECTION  1973, 1977   x2   EAR CYST EXCISION Left 08/15/2014   Procedure: LEFT THUMB DEBRIDEMENT INTERPHALANGEAL JOINT LEFT THUMB REMOVAL MUCOID CYST ;  Surgeon: Cindee Salt, MD;  Location: [facility];  Service: Orthopedics;  Laterality: Left;   FINGER SURGERY  2002   lt long finger mass   GANGLION CYST EXCISION Left 2/02   TOTAL ABDOMINAL HYSTERECTOMY W/ BILATERAL SALPINGOOPHORECTOMY  1997   TUBAL LIGATION  1977    Family History  Problem Relation Age of Onset   Heart disease Mother    Hyperlipidemia Mother    Thyroid disease Mother    Osteoporosis Mother    Dementia Mother    Breast cancer Mother 49   Scleroderma Mother    Scleroderma Father    Diabetes Father    Heart disease Father    Cancer Father        prostate   Cancer Brother        Eye   Cancer Brother        gleoblastoma   Breast cancer Daughter 39    Social History   Occupational History   Not on file  Tobacco Use   Smoking status: Never   Smokeless tobacco: Never  Vaping Use   Vaping status: Never Used  Substance and Sexual Activity   Alcohol use: Yes    Comment: glass of wine weeklu   Drug use: No   Sexual activity: Not Currently    Partners: Male    Birth control/protection: Surgical    Comment: Hysterectomy     Physical Exam: Blood pressure 104/62, pulse 79, height 5' 5.5" (1.664 m), weight 148 lb 12.8 oz (67.5 kg), SpO2 96%.  Gen:      No acute distress ENT:  no nasal polyps, mucus membranes moist Lungs:    No increased respiratory effort, symmetric chest wall excursion, clear to auscultation bilaterally, no wheezes or crackles CV:         Regular rate and rhythm; no murmurs, rubs, or gallops.  No pedal edema   Data Reviewed: Imaging: I have personally reviewed the chest xray obtained today shows normal hemidiaphragms, no acute pulmonary  process  PFTs:   Labs:  Immunization status: Immunization History  Administered Date(s) Administered   Fluad Quad(high Dose 65+) 02/13/2019, 02/21/2022   Influenza, High Dose Seasonal PF 02/08/2018, 03/05/2020   PFIZER(Purple Top)SARS-COV-2 Vaccination 06/27/2019, 07/05/2019, 02/14/2020, 11/17/2020   Pneumococcal Conjugate-13 03/14/2014   Pneumococcal Polysaccharide-23 04/09/2015   Tdap 09/06/2005, 05/17/2016, 04/16/2020   Zoster, Live 09/08/2006    External Records Personally Reviewed: heme/onc  Assessment:  Shortness of breath, chronic cough   Plan/Recommendations: Chest xray today to evaluate for elevated hemidiaphragm was negative.   Since your symptoms improved with albuterol I suspect asthma could be a cause for your wheezing, coughing and shortness of breath.   We will try dulera inhaler 2 puffs twice daily, gargle after use.   Take the albuterol rescue inhaler every 4 to 6 hours as needed for wheezing or shortness of breath.   Return to Care: Return in about 3 months (around 03/01/2023).   Durel Salts, MD Pulmonary and Critical Care Medicine [facility] Office:[phone]

## 2022-11-29 NOTE — Patient Instructions (Addendum)
Please schedule follow up scheduled with myself in 3 months.  If my schedule is not open yet, we will contact you with a reminder closer to that time. Please call [phone] if you haven't heard from [place] a month before.   Chest xray today.   Since your symptoms improved with albuterol I suspect asthma could be a cause for your wheezing, coughing and shortness of breath.   We will try dulera inhaler 2 puffs twice daily, gargle after use.   Take the albuterol rescue inhaler every 4 to 6 hours as needed for wheezing or shortness of breath. You can also take it 15 minutes before exercise or exertional activity. Side effects include heart racing or pounding, jitters or anxiety. If you have a history of an irregular heart rhythm, it can make this worse. Can also give some patients a hard time sleeping.

## 2022-12-01 ENCOUNTER — Other Ambulatory Visit: Payer: Self-pay | Admitting: Family

## 2022-12-01 DIAGNOSIS — D6851 Activated protein C resistance: Secondary | ICD-10-CM

## 2022-12-01 DIAGNOSIS — R760 Raised antibody titer: Secondary | ICD-10-CM

## 2022-12-01 DIAGNOSIS — D6859 Other primary thrombophilia: Secondary | ICD-10-CM

## 2022-12-01 DIAGNOSIS — I8000 Phlebitis and thrombophlebitis of superficial vessels of unspecified lower extremity: Secondary | ICD-10-CM

## 2022-12-06 ENCOUNTER — Telehealth: Payer: Self-pay

## 2022-12-06 ENCOUNTER — Other Ambulatory Visit (HOSPITAL_BASED_OUTPATIENT_CLINIC_OR_DEPARTMENT_OTHER): Payer: Self-pay | Admitting: Obstetrics & Gynecology

## 2022-12-06 DIAGNOSIS — Z1231 Encounter for screening mammogram for malignant neoplasm of breast: Secondary | ICD-10-CM

## 2022-12-06 NOTE — Telephone Encounter (Signed)
*  Pulm  Pharmacy Patient Advocate Encounter   Received notification from CoverMyMeds that prior authorization for [facility] 100-5MCG/ACT aerosol  is required/requested.   Insurance verification completed.   The patient is insured through [employer]/ [employer] .   Per test claim: PA required; PA submitted to [employer]/ [employer] via CoverMyMeds Key/confirmation #/EOC [id number] Status is pending

## 2022-12-07 NOTE — Telephone Encounter (Signed)
PA has been DENIED.   Denial letter has been attached in patients media. 

## 2022-12-08 ENCOUNTER — Telehealth (HOSPITAL_BASED_OUTPATIENT_CLINIC_OR_DEPARTMENT_OTHER): Payer: Self-pay | Admitting: *Deleted

## 2022-12-08 NOTE — Telephone Encounter (Signed)
Pt needs refill on estradiol cream. [facility]

## 2022-12-08 NOTE — Telephone Encounter (Signed)
Patient called and stated she returning someone call about her refill request.

## 2022-12-24 DIAGNOSIS — H40013 Open angle with borderline findings, low risk, bilateral: Secondary | ICD-10-CM | POA: Diagnosis not present

## 2023-01-06 ENCOUNTER — Encounter: Payer: Self-pay | Admitting: Internal Medicine

## 2023-01-06 DIAGNOSIS — M19011 Primary osteoarthritis, right shoulder: Secondary | ICD-10-CM | POA: Diagnosis not present

## 2023-01-06 DIAGNOSIS — M25511 Pain in right shoulder: Secondary | ICD-10-CM | POA: Diagnosis not present

## 2023-01-07 ENCOUNTER — Other Ambulatory Visit (HOSPITAL_COMMUNITY): Payer: Self-pay

## 2023-01-07 NOTE — Telephone Encounter (Signed)
[name] was denied 12-06-2022 due to patient not try/fail formulary alternative, Breo. Test claimed [name], this is covered with a $97.67 co-pay.

## 2023-01-11 MED ORDER — FLUTICASONE FUROATE-VILANTEROL 200-25 MCG/ACT IN AEPB: 1.0000 | INHALATION_SPRAY | Freq: Every day | RESPIRATORY_TRACT | 5 refills | Status: DC

## 2023-01-14 DIAGNOSIS — S50311A Abrasion of right elbow, initial encounter: Secondary | ICD-10-CM | POA: Diagnosis not present

## 2023-01-14 DIAGNOSIS — L089 Local infection of the skin and subcutaneous tissue, unspecified: Secondary | ICD-10-CM | POA: Diagnosis not present

## 2023-01-17 DIAGNOSIS — S161XXD Strain of muscle, fascia and tendon at neck level, subsequent encounter: Secondary | ICD-10-CM | POA: Diagnosis not present

## 2023-01-19 DIAGNOSIS — S161XXD Strain of muscle, fascia and tendon at neck level, subsequent encounter: Secondary | ICD-10-CM | POA: Diagnosis not present

## 2023-01-20 ENCOUNTER — Ambulatory Visit (HOSPITAL_BASED_OUTPATIENT_CLINIC_OR_DEPARTMENT_OTHER)
Admission: RE | Admit: 2023-01-20 | Discharge: 2023-01-20 | Disposition: A | Discharge status: Home / Self Care | Payer: PPO | Source: Ambulatory Visit | Attending: Obstetrics & Gynecology | Admitting: Obstetrics & Gynecology

## 2023-01-20 ENCOUNTER — Ambulatory Visit (HOSPITAL_BASED_OUTPATIENT_CLINIC_OR_DEPARTMENT_OTHER): Admission: RE | Admit: 2023-01-20 | Payer: PPO | Source: Ambulatory Visit | Admitting: Radiology

## 2023-01-20 DIAGNOSIS — M8588 Other specified disorders of bone density and structure, other site: Secondary | ICD-10-CM | POA: Diagnosis not present

## 2023-01-20 DIAGNOSIS — Z780 Asymptomatic menopausal state: Secondary | ICD-10-CM | POA: Diagnosis not present

## 2023-01-20 DIAGNOSIS — M8589 Other specified disorders of bone density and structure, multiple sites: Secondary | ICD-10-CM | POA: Diagnosis not present

## 2023-01-20 DIAGNOSIS — M25511 Pain in right shoulder: Secondary | ICD-10-CM | POA: Diagnosis not present

## 2023-01-22 ENCOUNTER — Ambulatory Visit (HOSPITAL_BASED_OUTPATIENT_CLINIC_OR_DEPARTMENT_OTHER)
Admission: RE | Admit: 2023-01-22 | Discharge: 2023-01-22 | Disposition: A | Discharge status: Home / Self Care | Payer: PPO | Source: Ambulatory Visit | Attending: Obstetrics & Gynecology | Admitting: Obstetrics & Gynecology

## 2023-01-22 DIAGNOSIS — Z1231 Encounter for screening mammogram for malignant neoplasm of breast: Secondary | ICD-10-CM | POA: Diagnosis not present

## 2023-01-24 DIAGNOSIS — S161XXD Strain of muscle, fascia and tendon at neck level, subsequent encounter: Secondary | ICD-10-CM | POA: Diagnosis not present

## 2023-01-31 DIAGNOSIS — S161XXD Strain of muscle, fascia and tendon at neck level, subsequent encounter: Secondary | ICD-10-CM | POA: Diagnosis not present

## 2023-02-02 DIAGNOSIS — S161XXD Strain of muscle, fascia and tendon at neck level, subsequent encounter: Secondary | ICD-10-CM | POA: Diagnosis not present

## 2023-02-08 DIAGNOSIS — S161XXD Strain of muscle, fascia and tendon at neck level, subsequent encounter: Secondary | ICD-10-CM | POA: Diagnosis not present

## 2023-02-10 DIAGNOSIS — S161XXD Strain of muscle, fascia and tendon at neck level, subsequent encounter: Secondary | ICD-10-CM | POA: Diagnosis not present

## 2023-02-14 DIAGNOSIS — S161XXD Strain of muscle, fascia and tendon at neck level, subsequent encounter: Secondary | ICD-10-CM | POA: Diagnosis not present

## 2023-02-23 ENCOUNTER — Ambulatory Visit (HOSPITAL_BASED_OUTPATIENT_CLINIC_OR_DEPARTMENT_OTHER): Payer: PPO | Admitting: Obstetrics & Gynecology

## 2023-02-23 ENCOUNTER — Encounter (HOSPITAL_BASED_OUTPATIENT_CLINIC_OR_DEPARTMENT_OTHER): Payer: Self-pay | Admitting: Obstetrics & Gynecology

## 2023-02-23 VITALS — BP 144/79 | HR 70 | Ht 64.75 in | Wt 148.4 lb

## 2023-02-23 DIAGNOSIS — Z9189 Other specified personal risk factors, not elsewhere classified: Secondary | ICD-10-CM

## 2023-02-23 DIAGNOSIS — M8588 Other specified disorders of bone density and structure, other site: Secondary | ICD-10-CM | POA: Diagnosis not present

## 2023-02-23 NOTE — Progress Notes (Signed)
Subjective:    55 yrs Married Caucasian G2P2  female here to discuss recent BMD obtained 01/20/2023 showing osteopenia.   Worse T score was -2.0 and in her forearm.  This had decreased from -1.7 to -2.0.  Hip measurements reviewed.  Frax score for hip fracture was elevated.  This was 3.3% risk.    Osteoporosis Risk Factors  Personal Hx of fracture as an adult: no Family hx of osteoporosis: grandmother with hx of hip fracture, great grandmother with hx of dowager's hump Tobacco use: no Low body weight (<127 lbs): no Estrogen deficiency with either early menopause (age <45) or bilateral ovariectomy: no Low calcium intake (lifelong): no Alcohol use more than 2 drinks per day: no Recurrent falls: no Inadequate physical activity: No.  Pt walking and uses stationary bike, rowing machine, balancing exercise for seniors  Current calcium and Vit D intake: 1200 mg with Vit D  Past Medical History:  Diagnosis Date   Contact lens/glasses fitting    wears one contact lt eye   Family history of breast cancer    daughter, diagnosed at 35   Heterozygous factor V Leiden mutation (HCC)    Lupus anticoagulant positive    Osteopenia    PONV (postoperative nausea and vomiting)    Superficial vein thrombosis 1997   Varicose veins of both lower extremities    Current Outpatient Medications on File Prior to Visit  Medication Sig Dispense Refill   acetaminophen (TYLENOL) 500 MG tablet Take 500 mg by mouth every 4 (four) hours as needed for mild pain.     albuterol (VENTOLIN HFA) 108 (90 Base) MCG/ACT inhaler Inhale 2 puffs into the lungs every 6 (six) hours as needed. 18 g 3   calcium carbonate (OS-CAL) 600 MG TABS tablet Take 600 mg by mouth 2 (two) times daily with a meal.     calcium carbonate (TUMS - DOSED IN MG ELEMENTAL CALCIUM) 500 MG chewable tablet Chew 1 tablet by mouth as needed for indigestion or heartburn.     celecoxib (CELEBREX) 100 MG capsule Take 100 mg by mouth daily as needed.      cholecalciferol (VITAMIN D3) 25 MCG (1000 UNIT) tablet Take 1,000 Units by mouth.     famotidine (PEPCID) 20 MG tablet Take 20 mg by mouth daily.     fish oil-omega-3 fatty acids 1000 MG capsule Take 2 g by mouth daily.     Flaxseed, Linseed, (FLAX SEED OIL PO) Take 1 capsule by mouth daily.     fluticasone furoate-vilanterol (BREO ELLIPTA) 200-25 MCG/ACT AEPB Inhale 1 puff into the lungs daily. 1 each 5   Melatonin 10 MG CAPS Take by mouth at bedtime as needed.     Multiple Vitamins-Minerals (MULTIVITAMIN WITH MINERALS) tablet Take 1 tablet by mouth every other day.     NONFORMULARY OR COMPOUNDED ITEM Estradiol cream 0.02% in 1ml prefilled syringes.  1ml pv twice weekly.  #2month supply. 1 each 3   Plant Sterols and Stanols (CHOLESTOFF PO) Take by mouth daily.     vitamin C (ASCORBIC ACID) 500 MG tablet Take 500 mg by mouth daily.     XARELTO 10 MG TABS tablet TAKE 1 TABLET(10 MG) BY MOUTH DAILY 90 tablet 2   No current facility-administered medications on file prior to visit.   Allergies  Allergen Reactions   Ciprofloxacin Itching and Swelling    Swelling lips   Indomethacin Other (See Comments)    Felt chest pain, possible esophageal spasm   Ofloxacin Itching and  Swelling   Adhesive [Tape] Rash    Tapes cause blister, need to use paper tape     Review of Systems Pertinent items noted in HPI and remainder of comprehensive ROS otherwise negative.     Objective:   PHYSICAL EXAM BP (!) 144/79 (BP Location: Right Arm, Patient Position: Sitting, Cuff Size: Large)   Pulse 70   Ht 5' 4.75" (1.645 m)   Wt 148 lb 6.4 oz (67.3 kg)   BMI 24.89 kg/m  General appearance: alert and no distress  Imaging Bone Density: Spine T Score: not measured, Hip T Score: -2.0 done on 01/20/2023 FRAX score:  10 year probability of hip fracture: 3.3%.                        10-year probability of major osteoporotic fractures  is 13.1%.                                         Assessment:    Osteopenia with T score -2.0   Plan:   1.  Pt not interested in medications at this time.  2.  Patient counseled in adequate calcium and vitamin D.  She is taking:  1200mg  calcium.  She is getting calcium in her diet as well.  Discussed decreasing supplemental calcium due to cardiovascular risks.  3.  30 minutes of weight bearing exercise at least 3 times weekly recommended.  4.  Fall prevention discussed.  5.  Lab work ordered:  Vit D  6.  Repeat BMD 2 years.      Total time with pt:  23 minutes

## 2023-02-24 LAB — VITAMIN D 25 HYDROXY (VIT D DEFICIENCY, FRACTURES): Vit D, 25-Hydroxy: 54.8 ng/mL (ref 30.0–100.0)

## 2023-03-08 DIAGNOSIS — M25511 Pain in right shoulder: Secondary | ICD-10-CM | POA: Diagnosis not present

## 2023-03-29 DIAGNOSIS — H61001 Unspecified perichondritis of right external ear: Secondary | ICD-10-CM | POA: Diagnosis not present

## 2023-03-30 ENCOUNTER — Ambulatory Visit: Payer: PPO | Admitting: Internal Medicine

## 2023-03-30 ENCOUNTER — Encounter: Payer: Self-pay | Admitting: Internal Medicine

## 2023-03-30 VITALS — BP 118/78 | HR 73 | Temp 98.4°F | Ht 65.5 in | Wt 148.2 lb

## 2023-03-30 DIAGNOSIS — J4520 Mild intermittent asthma, uncomplicated: Secondary | ICD-10-CM

## 2023-03-30 NOTE — Patient Instructions (Signed)
It was a pleasure to see you today!  Please schedule follow up scheduled with myself in 1 year.  If my schedule is not open yet, we will contact you with a reminder closer to that time. Please call [phone] if you haven't heard from [place] a month before, and always call us sooner if issues or concerns arise. You can also send [place] a message through MyChart, but but aware that this is not to be used for urgent issues and it may take up to 5-7 days to receive a reply. Please be aware that you will likely be able to view your results before I have a chance to respond to them. Please give [place] 5 business days to respond to any non-urgent results.   Continue Breo as needed for your asthma. Because you do not have daily symptoms, using this inhaler on an as needed basis may be sufficient.  I recommend starting to use the inhaler daily as prescribed if you have symptoms of asthma such as chest tightness, wheezing, coughing, shortness of breath. You should also start using it if you have exposure to a sick contact, worsening allergies, or any other trigger for your asthma. I recommend you keep using it even after your respiratory symptoms resolve for 3-4 days. The goal of this therapy is to prevent your symptoms from becoming a flare severe enough to require steroids like prednisone.   Please call our office if using this inhaler on an as needed basis is not sufficient. You might need to be seen sooner than our scheduled follow up.

## 2023-03-30 NOTE — Progress Notes (Signed)
[name]    [id number]    13-Jun-1945  Primary Care Physician:Ross, Darlen Round, MD Date of Appointment: 03/30/2023 Established Patient Visit  Chief complaint:   Chief Complaint  Patient presents with   Follow-up    Follow up with [facility].  Using Breo prn basis. Uses when coughing or SOB.     HPI: [name] is a 77 y.o. woman with medical history of reflux and chronic cough - possible asthma.   Interval Updates: Here for follow up after trial of maintenance inhaler for asthma with breo. Elwin Sleight was not covered. [name] continues to help her symptoms of cough and shortness of breath. Has actually been taking only 2-3 times/week  Reflux controlled.   No dyspnea that limits activities.   I have reviewed the patient's family social and past medical history and updated as appropriate.   Past Medical History:  Diagnosis Date   Contact lens/glasses fitting    wears one contact lt eye   Family history of breast cancer    daughter, diagnosed at 56   Heterozygous factor V Leiden mutation (HCC)    Lupus anticoagulant positive    Osteopenia    PONV (postoperative nausea and vomiting)    Superficial vein thrombosis 1997   Varicose veins of both lower extremities     Past Surgical History:  Procedure Laterality Date   BARTHOLIN CYST MARSUPIALIZATION     BARTHOLIN CYST MARSUPIALIZATION Right 08/19/2014   Procedure: BARTHOLIN CYST MARSUPIALIZATION;  Surgeon: Jerene Bears, MD;  Location: [facility];  Service: Gynecology;  Laterality: Right;   CARPAL TUNNEL RELEASE Right 12/20/2012   Procedure: CARPAL TUNNEL RELEASE;  Surgeon: Nicki Reaper, MD;  Location: [facility];  Service: Orthopedics;  Laterality: Right;   CARPOMETACARPEL SUSPENSION PLASTY Right 05/29/2019   Procedure: CARPOMETACARPEL [facility]) SUSPENSION PLASTY excision trapexzium tendon transfer, microlink suspection plasty;  Surgeon: Cindee Salt, MD;  Location: [facility];  Service: Orthopedics;   Laterality: Right;  axillary block   CATARACT EXTRACTION Right 2009       CESAREAN SECTION  1973, 1977   x2   EAR CYST EXCISION Left 08/15/2014   Procedure: LEFT THUMB DEBRIDEMENT INTERPHALANGEAL JOINT LEFT THUMB REMOVAL MUCOID CYST ;  Surgeon: Cindee Salt, MD;  Location: [facility];  Service: Orthopedics;  Laterality: Left;   FINGER SURGERY  2002   lt long finger mass   GANGLION CYST EXCISION Left 2/02   TOTAL ABDOMINAL HYSTERECTOMY W/ BILATERAL SALPINGOOPHORECTOMY  1997   TUBAL LIGATION  1977    Family History  Problem Relation Age of Onset   Heart disease Mother    Hyperlipidemia Mother    Thyroid disease Mother    Osteoporosis Mother    Dementia Mother    Breast cancer Mother 69   Scleroderma Mother    Scleroderma Father    Diabetes Father    Heart disease Father    Cancer Father        prostate   Cancer Brother        Eye   Cancer Brother        gleoblastoma   Breast cancer Daughter 7    Social History   Occupational History   Not on file  Tobacco Use   Smoking status: Never   Smokeless tobacco: Never  Vaping Use   Vaping status: Never Used  Substance and Sexual Activity   Alcohol use: Yes    Comment:  glass of wine weeklu   Drug use: No   Sexual activity: Not Currently    Partners: Male    Birth control/protection: Surgical    Comment: Hysterectomy     Physical Exam: Blood pressure 118/78, pulse 73, temperature 98.4 F (36.9 C), temperature source Oral, height 5' 5.5" (1.664 m), weight 148 lb 3.2 oz (67.2 kg), SpO2 97%.  Gen:      No acute distress ENT: no thrush Lungs:    ctab no wheeze CV:         RRR no mrg   Data Reviewed: Imaging: I have personally reviewed the chest xray obtained today shows normal hemidiaphragms, no acute pulmonary process  PFTs:   Labs:  Immunization status: Immunization History  Administered Date(s) Administered   Fluad Quad(high Dose 65+) 02/13/2019, 02/21/2022   Influenza, High Dose Seasonal  PF 02/08/2018, 03/05/2020   PFIZER(Purple Top)SARS-COV-2 Vaccination 06/27/2019, 07/05/2019, 02/14/2020, 11/17/2020   Pneumococcal Conjugate-13 03/14/2014   Pneumococcal Polysaccharide-23 04/09/2015   Tdap 09/06/2005, 05/17/2016, 04/16/2020   Zoster, Live 09/08/2006    External Records Personally Reviewed: heme/onc  Assessment:  Mild intermittent asthma, controlled  Plan/Recommendations:  Continue Breo as needed for your asthma. Because you do not have daily symptoms, using this inhaler on an as needed basis may be sufficient.   Can add albuterol inhaler as needed for wheezing and shortness of breath if once daily breo not enough   Return to Care: Return in about 1 year (around 03/29/2024).   Durel Salts, MD Pulmonary and Critical Care Medicine [facility] Office:[phone]

## 2023-04-27 DIAGNOSIS — D485 Neoplasm of uncertain behavior of skin: Secondary | ICD-10-CM | POA: Diagnosis not present

## 2023-04-27 DIAGNOSIS — L570 Actinic keratosis: Secondary | ICD-10-CM | POA: Diagnosis not present

## 2023-05-16 ENCOUNTER — Encounter (INDEPENDENT_AMBULATORY_CARE_PROVIDER_SITE_OTHER): Payer: PPO | Admitting: Ophthalmology

## 2023-05-16 DIAGNOSIS — H35033 Hypertensive retinopathy, bilateral: Secondary | ICD-10-CM

## 2023-05-16 DIAGNOSIS — D3132 Benign neoplasm of left choroid: Secondary | ICD-10-CM

## 2023-05-16 DIAGNOSIS — Z961 Presence of intraocular lens: Secondary | ICD-10-CM

## 2023-05-16 DIAGNOSIS — H3581 Retinal edema: Secondary | ICD-10-CM

## 2023-05-25 NOTE — Progress Notes (Signed)
[facility] - Clinic Note  05/31/2023     CHIEF COMPLAINT Patient presents for Retina Follow Up  HISTORY OF PRESENT ILLNESS: [name] is a 78 y.o. female who presents to the clinic today for:   HPI     Retina Follow Up   Patient presents with  Other.  In left eye.  Severity is moderate.  Duration of 12 months.  Since onset it is stable.  I, the attending physician,  performed the HPI with the patient and updated documentation appropriately.        Comments   Patient states the vision is the same. She is using AT's OU BID.       Last edited by Rennis Chris, MD on 06/03/2023  4:39 PM.    Patient states vision the same OU.   Referring physician: Daisy Floro, MD [address] [place],  [place] [address]  HISTORICAL INFORMATION:   Selected notes from the MEDICAL RECORD NUMBER Referred by Dr. 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 (Ophthalmic Drugs)   No current facility-administered medications for this visit. (Ophthalmic Drugs)   Current Outpatient Medications (Other)  Medication Sig   acetaminophen (TYLENOL) 500 MG tablet Take 500 mg by mouth every 4 (four) hours as needed for mild pain.   albuterol (VENTOLIN HFA) 108 (90 Base) MCG/ACT inhaler Inhale 2 puffs into the lungs every 6 (six) hours as needed.   calcium carbonate (OS-CAL) 600 MG TABS tablet Take 600 mg by mouth 2 (two) times daily with a meal.   calcium carbonate (TUMS - DOSED IN MG ELEMENTAL CALCIUM) 500 MG chewable tablet Chew 1 tablet by mouth as needed for indigestion or heartburn.   celecoxib (CELEBREX) 100 MG capsule Take 100 mg by mouth daily as needed.   cholecalciferol (VITAMIN D3) 25 MCG (1000 UNIT) tablet Take 1,000 Units by mouth.   famotidine (PEPCID) 20 MG tablet Take 20 mg by mouth daily.   fish oil-omega-3 fatty acids 1000 MG capsule Take 2 g by mouth daily.   Flaxseed, Linseed, (FLAX  SEED OIL PO) Take 1 capsule by mouth daily.   fluticasone furoate-vilanterol (BREO ELLIPTA) 200-25 MCG/ACT AEPB Inhale 1 puff into the lungs daily. (Patient taking differently: Inhale 1 puff into the lungs daily as needed.)   Melatonin 10 MG CAPS Take by mouth at bedtime as needed.   Multiple Vitamins-Minerals (MULTIVITAMIN WITH MINERALS) tablet Take 1 tablet by mouth every other day.   mupirocin ointment (BACTROBAN) 2 % Apply 1 Application topically 2 (two) times daily. (Patient not taking: Reported on 06/02/2023)   NONFORMULARY OR COMPOUNDED ITEM Estradiol cream 0.02% in 1ml prefilled syringes.  1ml pv twice weekly.  #44month supply.   Plant Sterols and Stanols (CHOLESTOFF PO) Take by mouth daily.   vitamin C (ASCORBIC ACID) 500 MG tablet Take 500 mg by mouth daily.   XARELTO 10 MG TABS tablet TAKE 1 TABLET(10 MG) BY MOUTH DAILY   cefadroxil (DURICEF) 500 MG capsule Take 2 capsules (1,000 mg total) by mouth 2 (two) times daily for 10 days.   gentamicin ointment (GARAMYCIN) 0.1 % 1 application to affected area on right ear Externally Three times a day   silver sulfADIAZINE (SILVADENE) 1 % cream Apply 1 Application topically daily.   No current facility-administered medications for this visit. (Other)   REVIEW OF SYSTEMS: ROS   Positive for: Musculoskeletal, Eyes, Respiratory Negative for: Constitutional, Gastrointestinal,  Neurological, Skin, Genitourinary, HENT, Endocrine, Cardiovascular, Psychiatric, Allergic/Imm, Heme/Lymph Last edited by Charlette Caffey, COT on 05/31/2023  9:08 AM.      ALLERGIES Allergies  Allergen Reactions   Ciprofloxacin Itching and Swelling    Swelling lips   Indomethacin Other (See Comments)    Felt chest pain, possible esophageal spasm   Ofloxacin Itching and Swelling   Adhesive [Tape] Rash    Tapes cause blister, need to use paper tape    PAST MEDICAL HISTORY Past Medical History:  Diagnosis Date   Contact lens/glasses fitting    wears one contact  lt eye   Family history of breast cancer    daughter, diagnosed at 61   Heterozygous factor V Leiden mutation (HCC)    Lupus anticoagulant positive    Osteopenia    PONV (postoperative nausea and vomiting)    Superficial vein thrombosis 1997   Varicose veins of both lower extremities    Past Surgical History:  Procedure Laterality Date   BARTHOLIN CYST MARSUPIALIZATION     BARTHOLIN CYST MARSUPIALIZATION Right 08/19/2014   Procedure: BARTHOLIN CYST MARSUPIALIZATION;  Surgeon: Jerene Bears, MD;  Location: [facility];  Service: Gynecology;  Laterality: Right;   CARPAL TUNNEL RELEASE Right 12/20/2012   Procedure: CARPAL TUNNEL RELEASE;  Surgeon: Nicki Reaper, MD;  Location: [facility];  Service: Orthopedics;  Laterality: Right;   CARPOMETACARPEL SUSPENSION PLASTY Right 05/29/2019   Procedure: CARPOMETACARPEL [facility]) SUSPENSION PLASTY excision trapexzium tendon transfer, microlink suspection plasty;  Surgeon: Cindee Salt, MD;  Location: [facility];  Service: Orthopedics;  Laterality: Right;  axillary block   CATARACT EXTRACTION Right 2009       CESAREAN SECTION  1973, 1977   x2   EAR CYST EXCISION Left 08/15/2014   Procedure: LEFT THUMB DEBRIDEMENT INTERPHALANGEAL JOINT LEFT THUMB REMOVAL MUCOID CYST ;  Surgeon: Cindee Salt, MD;  Location: [facility];  Service: Orthopedics;  Laterality: Left;   FINGER SURGERY  2002   lt long finger mass   GANGLION CYST EXCISION Left 2/02   TOTAL ABDOMINAL HYSTERECTOMY W/ BILATERAL SALPINGOOPHORECTOMY  1997   TUBAL LIGATION  1977   FAMILY HISTORY Family History  Problem Relation Age of Onset   Heart disease Mother    Hyperlipidemia Mother    Thyroid disease Mother    Osteoporosis Mother    Dementia Mother    Breast cancer Mother 46   Scleroderma Mother    Scleroderma Father    Diabetes Father    Heart disease Father    Cancer Father        prostate   Cancer Brother        Eye   Cancer Brother         gleoblastoma   Breast cancer Daughter 45   SOCIAL HISTORY Social History   Tobacco Use   Smoking status: Never   Smokeless tobacco: Never  Vaping Use   Vaping status: Never Used  Substance Use Topics   Alcohol use: Yes    Comment: glass of wine weeklu   Drug use: No       OPHTHALMIC EXAM:  Base Eye Exam     Visual Acuity (Snellen - Linear)       Right Left   Dist cc 20/20 20/20    Correction: Glasses         Tonometry (Tonopen, 9:10 AM)       Right Left   Pressure 21 19  Pupils       Dark Light Shape React APD   Right 3 2 Round Brisk None   Left 3 2 Round Brisk None         Visual Fields       Left Right    Full Full         Extraocular Movement       Right Left    Full, Ortho Full, Ortho         Neuro/Psych     Oriented x3: Yes   Mood/Affect: Normal         Dilation     Both eyes: 1.0% Mydriacyl, 2.5% Phenylephrine @ 9:08 AM           Slit Lamp and Fundus Exam     Slit Lamp Exam       Right Left   Lids/Lashes Dermatochalasis - upper lid, Meibomian gland dysfunction Dermatochalasis - upper lid, Meibomian gland dysfunction   Conjunctiva/Sclera White and quiet White and quiet   Cornea mild arcus, trace fine Punctate epithelial erosions, mild tear film debris, well healed cataract wound mild arcus, trace fine Punctate epithelial erosions, well healed cataract wound, mild tear film debris, trace EBMD   Anterior Chamber deep and clear Deep and quiet   Iris Round and dilated Round and dilated   Lens PC IOL in good position PC IOL in good position, 1+ Posterior capsular opacification   Anterior Vitreous clear mild vitreous syneresis         Fundus Exam       Right Left   Disc Pink and Sharp Pink and Sharp   C/D Ratio 0.4 0.4   Macula Flat, good foveal reflex, mild RPE mottling, No heme or edema Flat, Blunted foveal reflex, RPE mottling and clumping, No heme or edema   Vessels mild attenuation, mild tortuosity mild  attenuation, mild tortuosity   Periphery Attached, no heme Attached, amelanotic nevus with overlying pigment clumping, drusen, no SRF, no orange pigment, approx 4x5 DD -- just outside distal ST arcades           Refraction     Wearing Rx       Sphere Cylinder Axis Add   Right -0.50 +0.75 050 +2.00   Left -0.25 +0.25 032 +2.00            IMAGING AND PROCEDURES  Imaging and Procedures for 05/31/2023  OCT, Retina - OU - Both Eyes       Right Eye Quality was good. Central Foveal Thickness: 280. Progression has been stable. Findings include normal foveal contour, no IRF, no SRF.   Left Eye Quality was good. Central Foveal Thickness: 274. Progression has been stable. Findings include normal foveal contour, no IRF, no SRF, pigment epithelial detachment (Mildly elevated hyper reflective choroidal lesion with overlying drusen and PED; no SRF--stable from prior).   Notes *Images captured and stored on drive  Diagnosis / Impression:  OD: NFP, no IRF/SRF  OS: Mildly elevated hyper reflective choroidal lesion with overlying drusen and PED; no SRF---stable from prior  Clinical management:  See below  Abbreviations: NFP - Normal foveal profile. CME - cystoid macular edema. PED - pigment epithelial detachment. IRF - intraretinal fluid. SRF - subretinal fluid. EZ - ellipsoid zone. ERM - epiretinal membrane. ORA - outer retinal atrophy. ORT - outer retinal tubulation. SRHM - subretinal hyper-reflective material. IRHM - intraretinal hyper-reflective material      Color Fundus Photography Optos - OU - Both Eyes  Right Eye Progression has been stable. Disc findings include pallor. Macula : normal observations. Vessels : normal observations. Periphery : RPE abnormality.   Left Eye Progression has been stable. Disc findings include pallor. Macula : normal observations. Vessels : normal observations. Periphery : RPE abnormality.   Notes Images stored on drive;    Impression: OD: NFP, no IRF/SRF OS: choroidal nevus; hypopigmented choroidal lesion with +drusen, ST midzone--stable from prior             ASSESSMENT/PLAN:    ICD-10-CM   1. 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 Pseudophakia  Z96.1     3. Hypertensive retinopathy of both eyes  H35.033      1. Choroidal Nevus, OS  - mostly amelanotic choroidal lesion just outside distal ST arcades ~4x5DD in size  - no visual symptoms with BCVA 20/20 OS  - +pigment clumping and drusen, no SRF, no orange pigment  - thickness < 2mm - pt reports brother with history of choroidal melanoma, s/p enucleation, who died ~10 yrs after enucleation from liver mets - OCT shows Mildly elevated hyper reflective choroidal lesion with overlying drusen and PED; no SRF -- stable form prior  - FA (09.06.22) shows choroidal lesion with early blockage and late staining just outside ST arcades  - repeat FA 01.05.24 unchanged from prior  - discussed findings, prognosis, and treatment options  - Followed by Dr. Gwendalyn Ege at [facility] as well   - f/u here in 1 year, w/DFE/OCT, Optos imaging  3. Pseudophakia OU  - s/p CE/IOL (Dr. Elmer Picker)  - IOLs in good position, doing well  - monitor   4. Opacified Posterior Capsule OS  - BCVA OS 20/20 today (slightly decreased from 20/15)  - patient follows with Dr. Cathey Endow  - advised patient to follow-up with Dr. Cathey Endow, or call our office if vision worsens  Ophthalmic Meds Ordered this visit:  No orders of the defined types were placed in this encounter.    Return in about 1 year (around 05/30/2024) for f/u Nevus OS , DFE, OCT, Optos color photos.  There are no Patient Instructions on file for this visit.  This document serves as a record of services personally performed by Karie Chimera, MD, PhD. It was created on their behalf by Charlette Caffey, COT an ophthalmic technician. The creation of  this record is the provider's dictation and/or activities during the visit.    Electronically signed by:  Charlette Caffey, COT  06/03/23 4:40 PM  This document serves as a record of services personally performed by Karie Chimera, MD, PhD. It was created on their behalf by Annalee Genta, COMT. The creation of this record is the provider's dictation and/or activities during the visit.  Electronically signed by: Annalee Genta, COMT 06/03/23 4:40 PM  Karie Chimera, M.D., Ph.D. Diseases & Surgery of the Retina and Vitreous [facility]  I have reviewed the above documentation for accuracy and completeness, and I agree with the above. Karie Chimera, M.D., Ph.D. 06/03/23 4:42 PM   Abbreviations: M myopia (nearsighted); A astigmatism; H hyperopia (farsighted); P presbyopia; Mrx spectacle prescription;  CTL contact lenses; OD right eye; OS left eye; OU both eyes  XT exotropia; ET esotropia; PEK punctate epithelial keratitis; PEE punctate epithelial erosions; DES dry eye syndrome; MGD meibomian gland dysfunction; ATs artificial tears; PFAT's  preservative free artificial tears; NSC nuclear sclerotic cataract; PSC posterior subcapsular cataract; ERM epi-retinal membrane; PVD posterior vitreous detachment; RD retinal detachment; DM diabetes mellitus; DR diabetic retinopathy; NPDR non-proliferative diabetic retinopathy; PDR proliferative diabetic retinopathy; CSME clinically significant macular edema; DME diabetic macular edema; dbh dot blot hemorrhages; CWS cotton wool spot; POAG primary open angle glaucoma; C/D cup-to-disc ratio; HVF humphrey visual field; GVF goldmann visual field; OCT optical coherence tomography; IOP intraocular pressure; BRVO Branch retinal vein occlusion; CRVO central retinal vein occlusion; CRAO central retinal artery occlusion; BRAO branch retinal artery occlusion; RT retinal tear; SB scleral buckle; PPV pars plana vitrectomy; VH Vitreous hemorrhage; PRP  panretinal laser photocoagulation; IVK intravitreal kenalog; VMT vitreomacular traction; MH Macular hole;  NVD neovascularization of the disc; NVE neovascularization elsewhere; AREDS age related eye disease study; ARMD age related macular degeneration; POAG primary open angle glaucoma; EBMD epithelial/anterior basement membrane dystrophy; ACIOL anterior chamber intraocular lens; IOL intraocular lens; PCIOL posterior chamber intraocular lens; Phaco/IOL phacoemulsification with intraocular lens placement; PRK photorefractive keratectomy; LASIK laser assisted in situ keratomileusis; HTN hypertension; DM diabetes mellitus; COPD chronic obstructive pulmonary disease

## 2023-05-27 ENCOUNTER — Inpatient Hospital Stay: Payer: PPO | Admitting: Hematology & Oncology

## 2023-05-27 ENCOUNTER — Encounter: Payer: Self-pay | Admitting: Family

## 2023-05-27 ENCOUNTER — Inpatient Hospital Stay: Payer: PPO | Attending: Hematology & Oncology

## 2023-05-27 ENCOUNTER — Inpatient Hospital Stay: Payer: PPO

## 2023-05-27 ENCOUNTER — Inpatient Hospital Stay: Payer: PPO | Admitting: Family

## 2023-05-27 VITALS — BP 135/68 | HR 70 | Temp 98.6°F | Resp 18 | Ht 65.0 in | Wt 151.1 lb

## 2023-05-27 DIAGNOSIS — D6851 Activated protein C resistance: Secondary | ICD-10-CM | POA: Diagnosis not present

## 2023-05-27 DIAGNOSIS — I8000 Phlebitis and thrombophlebitis of superficial vessels of unspecified lower extremity: Secondary | ICD-10-CM

## 2023-05-27 DIAGNOSIS — Z86718 Personal history of other venous thrombosis and embolism: Secondary | ICD-10-CM | POA: Insufficient documentation

## 2023-05-27 DIAGNOSIS — Z7901 Long term (current) use of anticoagulants: Secondary | ICD-10-CM | POA: Diagnosis not present

## 2023-05-27 DIAGNOSIS — R760 Raised antibody titer: Secondary | ICD-10-CM

## 2023-05-27 DIAGNOSIS — D6859 Other primary thrombophilia: Secondary | ICD-10-CM

## 2023-05-27 LAB — CBC WITH DIFFERENTIAL (CANCER CENTER ONLY)
Abs Immature Granulocytes: 0.03 10*3/uL (ref 0.00–0.07)
Basophils Absolute: 0 10*3/uL (ref 0.0–0.1)
Basophils Relative: 0 %
Eosinophils Absolute: 0.1 10*3/uL (ref 0.0–0.5)
Eosinophils Relative: 1 %
HCT: 40.7 % (ref 36.0–46.0)
Hemoglobin: 13.8 g/dL (ref 12.0–15.0)
Immature Granulocytes: 0 %
Lymphocytes Relative: 27 %
Lymphs Abs: 2.1 10*3/uL (ref 0.7–4.0)
MCH: 32.5 pg (ref 26.0–34.0)
MCHC: 33.9 g/dL (ref 30.0–36.0)
MCV: 96 fL (ref 80.0–100.0)
Monocytes Absolute: 0.5 10*3/uL (ref 0.1–1.0)
Monocytes Relative: 6 %
Neutro Abs: 5.2 10*3/uL (ref 1.7–7.7)
Neutrophils Relative %: 66 %
Platelet Count: 238 10*3/uL (ref 150–400)
RBC: 4.24 MIL/uL (ref 3.87–5.11)
RDW: 11.9 % (ref 11.5–15.5)
WBC Count: 7.9 10*3/uL (ref 4.0–10.5)
nRBC: 0 % (ref 0.0–0.2)

## 2023-05-27 LAB — CMP (CANCER CENTER ONLY)
ALT: 40 U/L (ref 0–44)
AST: 35 U/L (ref 15–41)
Albumin: 4.4 g/dL (ref 3.5–5.0)
Alkaline Phosphatase: 82 U/L (ref 38–126)
Anion gap: 6 (ref 5–15)
BUN: 17 mg/dL (ref 8–23)
CO2: 28 mmol/L (ref 22–32)
Calcium: 9.5 mg/dL (ref 8.9–10.3)
Chloride: 106 mmol/L (ref 98–111)
Creatinine: 0.72 mg/dL (ref 0.44–1.00)
GFR, Estimated: >60 mL/min (ref >60)
Glucose, Bld: 122 mg/dL — ABNORMAL HIGH (ref 70–99)
Potassium: 4.6 mmol/L (ref 3.5–5.1)
Sodium: 140 mmol/L (ref 135–145)
Total Bilirubin: 0.4 mg/dL (ref 0.0–1.2)
Total Protein: 7 g/dL (ref 6.5–8.1)

## 2023-05-27 NOTE — Progress Notes (Signed)
Hematology and Oncology Follow Up Visit  [name] [id number] Aug 15, 1945 78 y.o. 05/27/2023   Principle Diagnosis:  Factor V Leiden -heterozygous/lupus anticoagulant  (+) Remote history of left lower extremity superficial thrombus Continued right superficial thrombus involving the great saphenous vein, now greater that 10 cm from the saphenofemoral junction  Superficial thrombus involving the right thigh varicosities, proximal and mid small saphenous veins   Current Therapy:        Xarelto 20 mg PO daily - reduced to 10 mg PO daily on 05/14/2022   Interim History:  [name] is here today for follow-up. She is doing fairly well but haw a would on the outer helix of the right ear. She states that the culture is pending and she is currently on Keflex PO BID for a total of 14 days. This is day 4. No fever, chills, n/v, cough, rash, dizziness, SOB, chest pain, palpitations, abdominal pain or changes in bowel or bladder habits.  No blood loss noted. No bruising or petechiae.  She is tolerating maintenance Xarelto nicely.    No swelling, tenderness, numbness or tingling in her extremities at this time.  No falls or syncope.  Appetite and hydration are good. Weight is stable at 151 lbs.   ECOG Performance Status: 0 - Asymptomatic  Medications:  Allergies as of 05/27/2023       Reactions   Ciprofloxacin Itching, Swelling   Swelling lips   Indomethacin Other (See Comments)   Felt chest pain, possible esophageal spasm   Ofloxacin Itching, Swelling   Adhesive [tape] Rash   Tapes cause blister, need to use paper tape        Medication List        Accurate as of May 27, 2023  2:11 PM. If you have any questions, ask your nurse or doctor.          acetaminophen 500 MG tablet Commonly known as: TYLENOL Take 500 mg by mouth every 4 (four) hours as needed for mild pain.   albuterol 108 (90 Base) MCG/ACT inhaler Commonly known as: VENTOLIN HFA Inhale 2 puffs into the lungs  every 6 (six) hours as needed.   ascorbic acid 500 MG tablet Commonly known as: VITAMIN C Take 500 mg by mouth daily.   calcium carbonate 500 MG chewable tablet Commonly known as: TUMS - dosed in mg elemental calcium Chew 1 tablet by mouth as needed for indigestion or heartburn.   calcium carbonate 600 MG Tabs tablet Commonly known as: OS-CAL Take 600 mg by mouth 2 (two) times daily with a meal.   celecoxib 100 MG capsule Commonly known as: CELEBREX Take 100 mg by mouth daily as needed.   cholecalciferol 25 MCG (1000 UNIT) tablet Commonly known as: VITAMIN D3 Take 1,000 Units by mouth.   CHOLESTOFF PO Take by mouth daily.   famotidine 20 MG tablet Commonly known as: PEPCID Take 20 mg by mouth daily.   fish oil-omega-3 fatty acids 1000 MG capsule Take 2 g by mouth daily.   FLAX SEED OIL PO Take 1 capsule by mouth daily.   fluticasone furoate-vilanterol 200-25 MCG/ACT Aepb Commonly known as: Breo Ellipta Inhale 1 puff into the lungs daily. What changed:  when to take this reasons to take this   Melatonin 10 MG Caps Take by mouth at bedtime as needed.   multivitamin with minerals tablet Take 1 tablet by mouth every other day.   NONFORMULARY OR COMPOUNDED ITEM Estradiol cream 0.02% in 1ml prefilled syringes.  1ml pv twice weekly.  #49month supply.   Xarelto 10 MG Tabs tablet Generic drug: rivaroxaban TAKE 1 TABLET(10 MG) BY MOUTH DAILY        Allergies:  Allergies  Allergen Reactions   Ciprofloxacin Itching and Swelling    Swelling lips   Indomethacin Other (See Comments)    Felt chest pain, possible esophageal spasm   Ofloxacin Itching and Swelling   Adhesive [Tape] Rash    Tapes cause blister, need to use paper tape     Past Medical History, Surgical history, Social history, and Family History were reviewed and updated.  Review of Systems: All other 10 point review of systems is negative.   Physical Exam:  vitals were not taken for this  visit.   Wt Readings from Last 3 Encounters:  03/30/23 148 lb 3.2 oz (67.2 kg)  02/23/23 148 lb 6.4 oz (67.3 kg)  11/29/22 148 lb 12.8 oz (67.5 kg)    Ocular: Sclerae unicteric, pupils equal, round and reactive to light Ear-nose-throat: Oropharynx clear, dentition fair Lymphatic: No cervical or supraclavicular adenopathy Lungs no rales or rhonchi, good excursion bilaterally Heart regular rate and rhythm, no murmur appreciated Abd soft, nontender, positive bowel sounds MSK no focal spinal tenderness, no joint edema Neuro: non-focal, well-oriented, appropriate affect Breasts: Deferred   Lab Results  Component Value Date   WBC 5.9 11/19/2022   HGB 13.3 11/19/2022   HCT 40.1 11/19/2022   MCV 96.6 11/19/2022   PLT 180 11/19/2022   No results found for: "FERRITIN", "IRON", "TIBC", "UIBC", "IRONPCTSAT" Lab Results  Component Value Date   RBC 4.15 11/19/2022   No results found for: "KPAFRELGTCHN", "LAMBDASER", "KAPLAMBRATIO" No results found for: "IGGSERUM", "IGA", "IGMSERUM" No results found for: "TOTALPROTELP", "ALBUMINELP", "A1GS", "A2GS", "BETS", "BETA2SER", "GAMS", "MSPIKE", "SPEI"   Chemistry      Component Value Date/Time   NA 142 11/19/2022 0955   K 4.0 11/19/2022 0955   CL 105 11/19/2022 0955   CO2 31 11/19/2022 0955   BUN 15 11/19/2022 0955   CREATININE 0.81 11/19/2022 0955   CREATININE 0.75 12/04/2015 1517      Component Value Date/Time   CALCIUM 9.9 11/19/2022 0955   ALKPHOS 69 11/19/2022 0955   AST 19 11/19/2022 0955   ALT 15 11/19/2022 0955   BILITOT 0.4 11/19/2022 0955       Impression and Plan: [name] is a very pleasant 78 yo caucasian female with significant history of thrombotic events starting 25 years ago with a left lower extremity superficial thrombus post hysterectomy and long car ride.  She has Factor V Leiden as well as positive lupus anticoagulant.  She is on long term anticoagulation with Xarelto 10 mg PO daily.  Follow-up in 6 months.    Eileen Stanford, NP 1/17/20252:11 PM

## 2023-05-31 ENCOUNTER — Ambulatory Visit (INDEPENDENT_AMBULATORY_CARE_PROVIDER_SITE_OTHER): Payer: PPO | Admitting: Ophthalmology

## 2023-05-31 DIAGNOSIS — D3132 Benign neoplasm of left choroid: Secondary | ICD-10-CM | POA: Diagnosis not present

## 2023-05-31 DIAGNOSIS — H35033 Hypertensive retinopathy, bilateral: Secondary | ICD-10-CM | POA: Diagnosis not present

## 2023-05-31 DIAGNOSIS — Z961 Presence of intraocular lens: Secondary | ICD-10-CM | POA: Diagnosis not present

## 2023-05-31 DIAGNOSIS — I10 Essential (primary) hypertension: Secondary | ICD-10-CM | POA: Diagnosis not present

## 2023-06-02 ENCOUNTER — Encounter: Payer: Self-pay | Admitting: Internal Medicine

## 2023-06-02 ENCOUNTER — Telehealth: Payer: Self-pay

## 2023-06-02 ENCOUNTER — Ambulatory Visit: Payer: PPO | Admitting: Internal Medicine

## 2023-06-02 ENCOUNTER — Other Ambulatory Visit: Payer: Self-pay

## 2023-06-02 VITALS — BP 137/85 | HR 70 | Temp 98.9°F | Resp 16 | Ht 65.0 in | Wt 152.8 lb

## 2023-06-02 DIAGNOSIS — L03211 Cellulitis of face: Secondary | ICD-10-CM

## 2023-06-02 DIAGNOSIS — Z881 Allergy status to other antibiotic agents status: Secondary | ICD-10-CM

## 2023-06-02 MED ORDER — SILVER SULFADIAZINE 1 % EX CREA: 1.0000 | TOPICAL_CREAM | Freq: Every day | CUTANEOUS | 0 refills | Status: AC

## 2023-06-02 MED ORDER — CEFADROXIL 500 MG PO CAPS: 1000.0000 mg | ORAL_CAPSULE | Freq: Two times a day (BID) | ORAL | 0 refills | Status: DC

## 2023-06-02 MED ORDER — CIPROFLOXACIN HCL 750 MG PO TABS: 750.0000 mg | ORAL_TABLET | Freq: Two times a day (BID) | ORAL | 0 refills | Status: DC

## 2023-06-02 NOTE — Patient Instructions (Addendum)
Antibiotics Stop cephalexin  Start: 1) cefadroxil 1000 mg (2 capsule) twice a day 2) cipro 750 mg twice a day 10 day course  3) silvadene cream twice a day     If any swelling with cipro let me know right away and stop, if limited rash on body can take benadryl before dosing of the cipro  See me in 1 week

## 2023-06-02 NOTE — Telephone Encounter (Signed)
[facility] called regarding receiving RX for Ciprofloxacin as it is listed as an allergy.   Per Dr.Vu note - patient unsure if she is truly allergic to ciprofloxacin. Patient will try ciprofloxacin for 10 days along with cefadroxil.   Pharmacist voiced her understanding and will make note.   [name], CMA

## 2023-06-02 NOTE — Progress Notes (Signed)
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 female without diabetes mellitus referred here for pseudomonas infection of her ear in setting cipro allergy  I reviewed chart from referral team: 05/24/23 progress note from [facility]  "Recheck biopsy on right antihelix; 2nd biopsy 04/27/23; started cephalexin for pain/swelling/weepy discharge a week prior" Mupirocin ointment given Refilled cephalexin 14 day course (only bid dosing) Patient has a wound swab culture 05/24/23 that showed PsA (S cefepime, ceftaz, cipro, gentamicin, imipenem, levoflox, tobra, piptazo)  She was referred here due to history of cipro allergy -- she said she took this several years ago once and had some tingling on her lips but no rash/swelling and told to stop. She doesn't recall taking quinolone products otherwise  She denies fever, chill  She currently on cephalexin bid and topical mupirocin was switched to gent. The gent appears to decrease the redness, but there is still pain  She feels well otherwise   Review of Systems: ROS  All other ros negative     Past Medical History:  Diagnosis Date   Contact lens/glasses fitting    wears one contact lt eye   Family history of breast cancer    daughter, diagnosed at 32   Heterozygous factor V Leiden mutation (HCC)    Lupus anticoagulant positive     Osteopenia    PONV (postoperative nausea and vomiting)    Superficial vein thrombosis 1997   Varicose veins of both lower extremities     Social History   Tobacco Use   Smoking status: Never   Smokeless tobacco: Never  Vaping Use   Vaping status: Never Used  Substance Use Topics   Alcohol use: Yes    Comment: glass of wine weeklu   Drug use: No    Family History  Problem Relation Age of Onset   Heart disease Mother    Hyperlipidemia Mother    Thyroid disease Mother    Osteoporosis Mother    Dementia Mother    Breast cancer Mother 62   Scleroderma Mother    Scleroderma Father    Diabetes Father    Heart disease Father    Cancer Father        prostate   Cancer Brother        Eye   Cancer Brother        gleoblastoma   Breast cancer Daughter 67    Allergies  Allergen Reactions   Ciprofloxacin Itching and Swelling    Swelling lips   Indomethacin Other (See Comments)    Felt chest pain, possible esophageal spasm   Ofloxacin Itching and Swelling   Adhesive [Tape] Rash    Tapes cause blister, need to use paper tape     OBJECTIVE: Vitals:   06/02/23 1001  BP: 137/85  Pulse: 70  Resp: 16  Temp: 98.9 F (37.2 C)  TempSrc: Temporal  Weight: 152  lb 12.8 oz (69.3 kg)  Height: 5\' 5"  (1.651 m)   There is no height or weight on file to calculate BMI.   Physical Exam General/constitutional: no distress, pleasant HEENT: Normocephalic, PER, Conj Clear, EOMI, Oropharynx clear -- right ear antihelex red and there is a small wound with eschar; the redness doesn't extend to the ear canal Neck supple CV: rrr no mrg Lungs: clear to auscultation, normal respiratory effort Abd: Soft, Nontender Ext: no edema  Skin:   Lab: Lab Results  Component Value Date   WBC 7.9 05/27/2023   HGB 13.8 05/27/2023   HCT 40.7 05/27/2023   MCV 96.0 05/27/2023   PLT 238 05/27/2023   Last metabolic panel Lab Results  Component Value Date   GLUCOSE 122 (H) 05/27/2023   NA 140  05/27/2023   K 4.6 05/27/2023   CL 106 05/27/2023   CO2 28 05/27/2023   BUN 17 05/27/2023   CREATININE 0.72 05/27/2023   GFRNONAA >60 05/27/2023   CALCIUM 9.5 05/27/2023   PROT 7.0 05/27/2023   ALBUMIN 4.4 05/27/2023   BILITOT 0.4 05/27/2023   ALKPHOS 82 05/27/2023   AST 35 05/27/2023   ALT 40 05/27/2023   ANIONGAP 6 05/27/2023    Microbiology:  Serology:  Imaging:   Assessment/plan: Problem List Items Addressed This Visit   None Visit Diagnoses       Allergy to antibiotic    -  Primary     Cellulitis of face             Nondiabetic 78 yo female s/p right ear antihelix biopsy complicated by cellulitis with superficial swab growing pseudoonas (after giving cephalexin/topical mupirocin)   Will change cephalexin to cephadroxil for the appropriate bid dosing Not sure if she truly have cipro allergy and will try that for a 10 days along with cefadroxil  Silvadene cream 1% for ear   F/u 1 week      Follow-up: Return in about 1 week (around 06/09/2023).  Raymondo Band, MD [facility] 06/02/2023, 9:55 AM

## 2023-06-03 ENCOUNTER — Encounter (INDEPENDENT_AMBULATORY_CARE_PROVIDER_SITE_OTHER): Payer: Self-pay | Admitting: Ophthalmology

## 2023-06-03 NOTE — Telephone Encounter (Signed)
Patient aware to STOP ciprofloxacin. Provider number given to patient.    Karolyne Timmons Lesli Albee, CMA

## 2023-06-03 NOTE — Addendum Note (Signed)
Addended by: Marcell Anger on: 06/03/2023 11:44 AM   Modules accepted: Orders

## 2023-06-03 NOTE — Telephone Encounter (Signed)
Patient called stating that her lips started burning, swelling and feeling tight 30 minutes after taking ciprofloxacin last night. Patient denied any other sx's. Please advise.    Traven Davids Lesli Albee, CMA

## 2023-06-03 NOTE — Telephone Encounter (Signed)
Tell her please stop Cipro  And give my number [phone] to call as needed  Thanks

## 2023-06-09 ENCOUNTER — Encounter: Payer: Self-pay | Admitting: Internal Medicine

## 2023-06-09 ENCOUNTER — Ambulatory Visit: Payer: PPO | Admitting: Internal Medicine

## 2023-06-09 ENCOUNTER — Other Ambulatory Visit: Payer: Self-pay

## 2023-06-09 VITALS — BP 136/85 | HR 65 | Temp 97.9°F | Ht 65.0 in | Wt 148.0 lb

## 2023-06-09 DIAGNOSIS — H60391 Other infective otitis externa, right ear: Secondary | ICD-10-CM | POA: Diagnosis not present

## 2023-06-09 DIAGNOSIS — Z881 Allergy status to other antibiotic agents status: Secondary | ICD-10-CM | POA: Diagnosis not present

## 2023-06-09 DIAGNOSIS — L03818 Cellulitis of other sites: Secondary | ICD-10-CM | POA: Diagnosis not present

## 2023-06-09 MED ORDER — CEFADROXIL 500 MG PO CAPS: 1000.0000 mg | ORAL_CAPSULE | Freq: Two times a day (BID) | ORAL | 0 refills | Status: AC

## 2023-06-09 NOTE — Progress Notes (Signed)
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 female without diabetes mellitus referred here for pseudomonas infection of her ear in setting cipro allergy  I reviewed chart from referral team: 05/24/23 progress note from [facility]  "Recheck biopsy on right antihelix; 2nd biopsy 04/27/23; started cephalexin for pain/swelling/weepy discharge a week prior" Mupirocin ointment given Refilled cephalexin 14 day course (only bid dosing) Patient has a wound swab culture 05/24/23 that showed PsA (S cefepime, ceftaz, cipro, gentamicin, imipenem, levoflox, tobra, piptazo)  She was referred here due to history of cipro allergy -- she said she took this several years ago once and had some tingling on her lips but no rash/swelling and told to stop. She doesn't recall taking quinolone products otherwise  She denies fever, chill  She currently on cephalexin bid and topical mupirocin was switched to gent. The gent appears to decrease the redness, but there is still pain  She feels well otherwise  ------------- 06/09/23 id clinic f/u Patient tried cipro and had lip swelling right after first dose --> stopped She has been taking silvadene cream and taking cefadroxil as well The redness/pain improving No fever, chill; no n/v/d Some oozing off the wound    Review of Systems: ROS  All other ros  negative     Past Medical History:  Diagnosis Date   Contact lens/glasses fitting    wears one contact lt eye   Family history of breast cancer    daughter, diagnosed at 55   Heterozygous factor V Leiden mutation (HCC)    Lupus anticoagulant positive    Osteopenia    PONV (postoperative nausea and vomiting)    Superficial vein thrombosis 1997   Varicose veins of both lower extremities     Social History   Tobacco Use   Smoking status: Never   Smokeless tobacco: Never  Vaping Use   Vaping status: Never Used  Substance Use Topics   Alcohol use: Yes    Comment: glass of wine weeklu   Drug use: No    Family History  Problem Relation Age of Onset   Heart disease Mother    Hyperlipidemia Mother    Thyroid disease Mother    Osteoporosis Mother    Dementia Mother    Breast cancer Mother 34   Scleroderma Mother    Scleroderma Father    Diabetes Father    Heart disease Father    Cancer Father        prostate   Cancer Brother        Eye   Cancer Brother        gleoblastoma   Breast cancer Daughter 5    Allergies  Allergen Reactions   Ciprofloxacin Itching and Swelling    Swelling lips   Indomethacin Other (See Comments)    Felt chest pain, possible esophageal spasm   Ofloxacin Itching and Swelling   Adhesive [  Tape] Rash    Tapes cause blister, need to use paper tape     OBJECTIVE: There were no vitals filed for this visit.  There is no height or weight on file to calculate BMI.   Physical Exam General/constitutional: no distress, pleasant HEENT: Normocephalic, PER, Conj Clear, EOMI, Oropharynx clear -- right ear antihelex red and there is a small wound with eschar; the redness doesn't extend to the ear canal Neck supple CV: rrr no mrg Lungs: clear to auscultation, normal respiratory effort Abd: Soft, Nontender Ext: no edema  Skin:    06/09/23 picture    Slight tenderness at mastoid area   Lab: Lab Results  Component Value Date   WBC  7.9 05/27/2023   HGB 13.8 05/27/2023   HCT 40.7 05/27/2023   MCV 96.0 05/27/2023   PLT 238 05/27/2023   Last metabolic panel Lab Results  Component Value Date   GLUCOSE 122 (H) 05/27/2023   NA 140 05/27/2023   K 4.6 05/27/2023   CL 106 05/27/2023   CO2 28 05/27/2023   BUN 17 05/27/2023   CREATININE 0.72 05/27/2023   GFRNONAA >60 05/27/2023   CALCIUM 9.5 05/27/2023   PROT 7.0 05/27/2023   ALBUMIN 4.4 05/27/2023   BILITOT 0.4 05/27/2023   ALKPHOS 82 05/27/2023   AST 35 05/27/2023   ALT 40 05/27/2023   ANIONGAP 6 05/27/2023    Microbiology:  Serology:  Imaging:   Assessment/plan: Problem List Items Addressed This Visit   None Visit Diagnoses       Other infective acute otitis externa of right ear    -  Primary     Cellulitis of other specified site         Allergy to antibiotic              Nondiabetic 78 yo female s/p right ear antihelix biopsy complicated by cellulitis with superficial swab growing pseudoonas (after giving cephalexin/topical mupirocin)   Will change cephalexin to cephadroxil for the appropriate bid dosing Not sure if she truly have cipro allergy and will try that for a 10 days along with cefadroxil  Silvadene cream 1% for ear   F/u 1 week   --------- 06/09/23 id assessment 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  Raymondo Band, MD [facility] 06/09/2023, 10:50 AM

## 2023-06-09 NOTE — Patient Instructions (Signed)
I have placed 14 more days of cefadroxil  Continue cefadroxil and silvadene cream; you can put silvadene cream posterior to the ear lobe   Picture twice a day and let me know immediately if anything concerning (more red/pain; fever/chill)   My number is [phone] to text only for emergent issue this weekend as needed   See me in 1 week again preferably by midweek next week

## 2023-06-14 ENCOUNTER — Ambulatory Visit: Payer: PPO | Admitting: Internal Medicine

## 2023-06-14 ENCOUNTER — Other Ambulatory Visit: Payer: Self-pay

## 2023-06-14 VITALS — BP 117/80 | HR 73 | Temp 98.2°F | Resp 16 | Wt 149.4 lb

## 2023-06-14 DIAGNOSIS — L0390 Cellulitis, unspecified: Secondary | ICD-10-CM | POA: Diagnosis not present

## 2023-06-14 NOTE — Progress Notes (Signed)
 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 female without diabetes mellitus referred here for pseudomonas infection of her ear in setting cipro  allergy  I reviewed chart from referral team: 05/24/23 progress note from [facility]  Recheck biopsy on right antihelix; 2nd biopsy 04/27/23; started cephalexin  for pain/swelling/weepy discharge a week prior Mupirocin ointment given Refilled cephalexin  14 day course (only bid dosing) Patient has a wound swab culture 05/24/23 that showed PsA (S cefepime, ceftaz, cipro , gentamicin, imipenem, levoflox, tobra, piptazo)  She was referred here due to history of cipro  allergy -- she said she took this several years ago once and had some tingling on her lips but no rash/swelling and told to stop. She doesn't recall taking quinolone products otherwise  She denies fever, chill  She currently on cephalexin  bid and topical mupirocin was switched to gent. The gent appears to decrease the redness, but there is still pain  She feels well otherwise  ------------- 06/09/23 id clinic f/u Patient tried cipro  and had lip swelling right after first dose --> stopped She has been taking silvadene  cream and taking cefadroxil  as well The redness/pain improving No fever, chill; no n/v/d Some oozing off the wound  06/14/23 id clinic f/u Patient has 10  more days of  cefadroxil ; has lots of silvadene  cream Pain continues to improve, no longer painful over the mastoid area No n/v/d/f/c Redness improving  Taking probiotics    Review of Systems: ROS  All other ros negative     Past Medical History:  Diagnosis Date   Contact lens/glasses fitting    wears one contact lt eye   Family history of breast cancer    daughter, diagnosed at 56   Heterozygous factor V Leiden mutation (HCC)    Lupus anticoagulant positive    Osteopenia    PONV (postoperative nausea and vomiting)    Superficial vein thrombosis 1997   Varicose veins of both lower extremities     Social History   Tobacco Use   Smoking status: Never   Smokeless tobacco: Never  Vaping Use   Vaping status: Never Used  Substance Use Topics   Alcohol use: Yes    Comment: glass of wine weeklu   Drug use: No    Family History  Problem Relation Age of Onset   Heart disease Mother    Hyperlipidemia Mother    Thyroid  disease Mother    Osteoporosis Mother    Dementia Mother    Breast cancer Mother 24   Scleroderma Mother    Scleroderma Father    Diabetes Father    Heart disease Father    Cancer Father        prostate   Cancer Brother        Eye   Cancer Brother        gleoblastoma   Breast cancer Daughter 18    Allergies  Allergen  Reactions   Ciprofloxacin  Itching and Swelling    Swelling lips   Indomethacin Other (See Comments)    Felt chest pain, possible esophageal spasm   Ofloxacin Itching and Swelling   Adhesive [Tape] Rash    Tapes cause blister, need to use paper tape     OBJECTIVE: Vitals:   06/14/23 1434  BP: 117/80  Pulse: 73  Resp: 16  Temp: 98.2 F (36.8 C)  TempSrc: Oral  SpO2: 97%  Weight: 149 lb 6.4 oz (67.8 kg)    Body mass index is 24.86 kg/m.   Physical Exam General/constitutional: no distress, pleasant HEENT: Normocephalic, PER, Conj Clear, EOMI, Oropharynx clear -- right ear antihelex red and there is a small wound with  eschar; the redness doesn't extend to the ear canal Neck supple CV: rrr no mrg Lungs: clear to auscultation, normal respiratory effort Abd: Soft, Nontender Ext: no edema  Skin:    06/09/23 picture    Slight tenderness at mastoid area   06/14/23 picture      Lab: Lab Results  Component Value Date   WBC 7.9 05/27/2023   HGB 13.8 05/27/2023   HCT 40.7 05/27/2023   MCV 96.0 05/27/2023   PLT 238 05/27/2023   Last metabolic panel Lab Results  Component Value Date   GLUCOSE 122 (H) 05/27/2023   NA 140 05/27/2023   K 4.6 05/27/2023   CL 106 05/27/2023   CO2 28 05/27/2023   BUN 17 05/27/2023   CREATININE 0.72 05/27/2023   GFRNONAA >60 05/27/2023   CALCIUM 9.5 05/27/2023   PROT 7.0 05/27/2023   ALBUMIN 4.4 05/27/2023   BILITOT 0.4 05/27/2023   ALKPHOS 82 05/27/2023   AST 35 05/27/2023   ALT 40 05/27/2023   ANIONGAP 6 05/27/2023    Microbiology:  Serology:  Imaging:   Assessment/plan: Problem List Items Addressed This Visit   None Visit Diagnoses       Cellulitis, unspecified cellulitis site    -  Primary   Relevant Orders   C-reactive protein   CBC   COMPLETE METABOLIC PANEL WITH GFR           Nondiabetic 78 yo female s/p right ear antihelix biopsy complicated by cellulitis with superficial swab growing pseudoonas (after giving cephalexin /topical mupirocin)   Will change cephalexin  to cephadroxil for the appropriate bid dosing Not sure if she truly have cipro  allergy and will try that for a 10 days along with cefadroxil   Silvadene  cream 1% for ear   F/u 1 week   --------- 06/09/23 id assessment 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   Improving cellulitis change with topical silvadene  and cefadroxil  and will continue  Advise her to take picture twice a day of the helix and posterior to ear and let me know immediately if any worsening sx or redness/pain  I do not feel we need to add systemic antipseudomonal medication now, which  would need to be intravenous  Slight tenderness near mastoid area but suspect related to the ear cellulitis not mastoiditis  F/u 1 week or sooner as needed if any worsening sx above   06/14/23 id clinic assessment Continue to be better clinically Suspect low blood flow area taking longer to treat Continue cefadroxil  (have 10 more days of cefadroxil ) which will give her a month of cefadroxil  appropriate dose treatment Continue silvadene  cream and can extend even after cefadroxil  is done  Follow up in 2 weeks Labs today  Follow-up: Return in about 2 weeks (around 06/28/2023).  Constance ONEIDA Passer, MD [facility] 06/14/2023, 2:52 PM

## 2023-06-14 NOTE — Patient Instructions (Signed)
Continue cefadroxil 10 days   Silvadene can be kept going beyond that if there is still crusting   See me in 2 weeks   Labs today

## 2023-06-15 LAB — CBC
HCT: 41.6 % (ref 35.0–45.0)
Hemoglobin: 13.6 g/dL (ref 11.7–15.5)
MCH: 31.6 pg (ref 27.0–33.0)
MCHC: 32.7 g/dL (ref 32.0–36.0)
MCV: 96.5 fL (ref 80.0–100.0)
MPV: 10.7 fL (ref 7.5–12.5)
Platelets: 278 10*3/uL (ref 140–400)
RBC: 4.31 10*6/uL (ref 3.80–5.10)
RDW: 11.9 % (ref 11.0–15.0)
WBC: 6.8 10*3/uL (ref 3.8–10.8)

## 2023-06-15 LAB — COMPLETE METABOLIC PANEL WITH GFR
AG Ratio: 1.8 (calc) (ref 1.0–2.5)
ALT: 19 U/L (ref 6–29)
AST: 19 U/L (ref 10–35)
Albumin: 4.3 g/dL (ref 3.6–5.1)
Alkaline phosphatase (APISO): 82 U/L (ref 37–153)
BUN: 17 mg/dL (ref 7–25)
CO2: 25 mmol/L (ref 20–32)
Calcium: 9.7 mg/dL (ref 8.6–10.4)
Chloride: 103 mmol/L (ref 98–110)
Creat: 0.75 mg/dL (ref 0.60–1.00)
Globulin: 2.4 g/dL (ref 1.9–3.7)
Glucose, Bld: 78 mg/dL (ref 65–99)
Potassium: 4.3 mmol/L (ref 3.5–5.3)
Sodium: 139 mmol/L (ref 135–146)
Total Bilirubin: 0.3 mg/dL (ref 0.2–1.2)
Total Protein: 6.7 g/dL (ref 6.1–8.1)
eGFR: 82 mL/min/{1.73_m2} (ref >=60)

## 2023-06-15 LAB — C-REACTIVE PROTEIN: CRP: 5.6 mg/L (ref <8.0)

## 2023-06-28 ENCOUNTER — Other Ambulatory Visit: Payer: Self-pay

## 2023-06-28 ENCOUNTER — Ambulatory Visit: Payer: PPO | Admitting: Internal Medicine

## 2023-06-28 ENCOUNTER — Encounter: Payer: Self-pay | Admitting: Internal Medicine

## 2023-06-28 VITALS — BP 131/79 | HR 77 | Temp 97.1°F | Ht 65.0 in | Wt 152.0 lb

## 2023-06-28 DIAGNOSIS — L0390 Cellulitis, unspecified: Secondary | ICD-10-CM | POA: Diagnosis not present

## 2023-06-28 NOTE — Progress Notes (Signed)
 [facility]  Reason for Consult:pseudomonas infection; cipro allergy Referring Provider: [facility] [phone]; fax [phone])    Patient Active Problem List   Diagnosis Date Noted   Family history of skin cancer 06/15/2021   Nevus of choroid of left eye 01/26/2021   Activated protein C resistance (HCC) 07/11/2020   Heterozygous factor V Leiden mutation (HCC)    Osteopenia    Primary osteoarthritis of both first carpometacarpal joints 05/31/2018   Bartholin's gland abscess 03/08/2014   ASTHMA 10/05/2007   OSTEOARTHRITIS 10/05/2007      HPI: [name] is a 78 y.o. female without diabetes mellitus referred here for pseudomonas infection of her ear in setting cipro allergy  I reviewed chart from referral team: 05/24/23 progress note from [facility]  "Recheck biopsy on right antihelix; 2nd biopsy 04/27/23; started cephalexin for pain/swelling/weepy discharge a week prior" Mupirocin ointment given Refilled cephalexin 14 day course (only bid dosing) Patient has a wound swab culture 05/24/23 that showed PsA (S cefepime, ceftaz, cipro, gentamicin, imipenem, levoflox, tobra, piptazo)  She was referred here due to history of cipro allergy -- she said she took this several years ago once and had some tingling on her lips but no rash/swelling and told to stop. She doesn't recall taking quinolone products otherwise  She denies fever, chill  She currently on cephalexin bid and topical mupirocin was switched to gent. The gent appears to decrease the redness, but there is still pain  She feels well otherwise  ------------- 06/09/23 id clinic f/u Patient tried cipro and had lip swelling right after first dose --> stopped She has been taking silvadene cream and taking cefadroxil as well The redness/pain improving No fever, chill; no n/v/d Some oozing off the wound  06/14/23 id clinic f/u Patient has 10  more days of  cefadroxil; has lots of silvadene cream Pain continues to improve, no longer painful over the mastoid area No n/v/d/f/c Redness improving  Taking probiotics   06/28/23 id clinic visit The ulcer/sore basically resolves Some redness Intermittent shooting pain on helix; no pain in the mastoid area No f/c Feels well Finished with cefadroxil 3 days ago Still using the silvadene cream Not yet followed up with dermatology   Review of Systems: ROS  All other ros negative     Past Medical History:  Diagnosis Date   Contact lens/glasses fitting    wears one contact lt eye   Family history of breast cancer    daughter, diagnosed at 21   Heterozygous factor V Leiden mutation (HCC)    Lupus anticoagulant positive    Osteopenia    PONV (postoperative nausea and vomiting)    Superficial vein thrombosis 1997   Varicose veins of both lower extremities     Social History   Tobacco Use   Smoking status: Never   Smokeless tobacco: Never  Vaping Use   Vaping status: Never Used  Substance Use Topics   Alcohol use: Yes    Comment: glass of wine weeklu   Drug use: No    Family History  Problem Relation Age of Onset   Heart disease Mother    Hyperlipidemia Mother    Thyroid disease Mother    Osteoporosis Mother    Dementia Mother    Breast cancer Mother 2   Scleroderma Mother    Scleroderma Father    Diabetes Father    Heart disease Father    Cancer Father  prostate   Cancer Brother        Eye   Cancer Brother        gleoblastoma   Breast cancer Daughter 28    Allergies  Allergen Reactions   Ciprofloxacin Itching and Swelling    Swelling lips   Indomethacin Other (See Comments)    Felt chest pain, possible esophageal spasm   Ofloxacin Itching and Swelling   Adhesive [Tape] Rash    Tapes cause blister, need to use paper tape     OBJECTIVE: Vitals:   06/28/23 1434  BP: 131/79  Pulse: 77  Temp: (!) 97.1 F (36.2 C)  TempSrc: Temporal  SpO2:  100%  Weight: 152 lb (68.9 kg)  Height: 5\' 5"  (1.651 m)    Body mass index is 25.29 kg/m.   Physical Exam General/constitutional: no distress, pleasant HEENT: Normocephalic, PER, Conj Clear, EOMI, Oropharynx clear -- right ear antihelex red and there is a small wound with eschar; the redness doesn't extend to the ear canal Neck supple CV: rrr no mrg Lungs: clear to auscultation, normal respiratory effort Abd: Soft, Nontender Ext: no edema  Skin:    06/09/23 picture    Slight tenderness at mastoid area   06/14/23 picture     06/28/23 picture Minimal tenderness on helix; no tenderness over mastoid area The ulcer almost resolved There is some redness over the helix still      Lab: Lab Results  Component Value Date   WBC 6.8 06/14/2023   HGB 13.6 06/14/2023   HCT 41.6 06/14/2023   MCV 96.5 06/14/2023   PLT 278 06/14/2023   Last metabolic panel Lab Results  Component Value Date   GLUCOSE 78 06/14/2023   NA 139 06/14/2023   K 4.3 06/14/2023   CL 103 06/14/2023   CO2 25 06/14/2023   BUN 17 06/14/2023   CREATININE 0.75 06/14/2023   GFRNONAA >60 05/27/2023   CALCIUM 9.7 06/14/2023   PROT 6.7 06/14/2023   ALBUMIN 4.4 05/27/2023   BILITOT 0.3 06/14/2023   ALKPHOS 82 05/27/2023   AST 19 06/14/2023   ALT 19 06/14/2023   ANIONGAP 6 05/27/2023    Microbiology:  Serology:  Imaging:   Assessment/plan: Problem List Items Addressed This Visit   None Visit Diagnoses       Cellulitis, unspecified cellulitis site    -  Primary            Nondiabetic 78 yo female s/p right ear antihelix biopsy complicated by cellulitis with superficial swab growing pseudoonas (after giving cephalexin/topical mupirocin)   Will change cephalexin to cephadroxil for the appropriate bid dosing Not sure if she truly have cipro allergy and will try that for a 10 days along with cefadroxil  Silvadene cream 1% for ear   F/u 1 week   --------- 06/09/23 id  assessment 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  Improving cellulitis change with topical silvadene and cefadroxil and will continue  Advise her to take picture twice a day of the helix and posterior to ear and let me know immediately if any worsening sx or redness/pain  I do not feel we need to add systemic antipseudomonal medication now, which would need to be intravenous  Slight tenderness near mastoid area but suspect related to the ear cellulitis not mastoiditis  F/u 1 week or sooner as needed if any worsening sx above   06/14/23 id clinic assessment Continue to be better clinically Suspect low blood flow area taking longer to  treat Continue cefadroxil (have 10 more days of cefadroxil) which will give her a month of cefadroxil appropriate dose treatment Continue silvadene cream and can extend even after cefadroxil is done  Follow up in 2 weeks Labs today    06/28/23 id assessment I am very happy with progress I suspect the pain at alone doesn't suggest ongoing infection The ulcer is almost resolved and continue to use silvadene cream for 1-2 more weeks Might benefit from steroid cream; voltaren gel or nsaids she can't tolerate F/u derm to inquire about that 4 weeks video visit as needed    Follow-up: Return in about 4 weeks (around 07/26/2023).  Raymondo Band, MD [facility] 06/28/2023, 2:38 PM

## 2023-06-28 NOTE — Patient Instructions (Signed)
 I think at this time the infection is basically gone. I would like for you to continue using the cream for 1-2 more weeks until the ulcer is completely gone   Please see dermatology and inquire about prednisone cream for the pain/inflammation there   Video visit with me in 4 weeks; if minimal pain and minimal to no redness at that time you can cancel the visit

## 2023-07-29 ENCOUNTER — Telehealth: Payer: PPO | Admitting: Internal Medicine

## 2023-08-01 ENCOUNTER — Ambulatory Visit (HOSPITAL_BASED_OUTPATIENT_CLINIC_OR_DEPARTMENT_OTHER): Payer: PPO | Admitting: Obstetrics & Gynecology

## 2023-08-01 ENCOUNTER — Encounter (HOSPITAL_BASED_OUTPATIENT_CLINIC_OR_DEPARTMENT_OTHER): Payer: Self-pay | Admitting: Obstetrics & Gynecology

## 2023-08-01 VITALS — BP 139/61 | HR 75 | Ht 65.0 in | Wt 149.6 lb

## 2023-08-01 DIAGNOSIS — B029 Zoster without complications: Secondary | ICD-10-CM | POA: Diagnosis not present

## 2023-08-01 DIAGNOSIS — M8580 Other specified disorders of bone density and structure, unspecified site: Secondary | ICD-10-CM

## 2023-08-01 DIAGNOSIS — N390 Urinary tract infection, site not specified: Secondary | ICD-10-CM

## 2023-08-01 DIAGNOSIS — Z01411 Encounter for gynecological examination (general) (routine) with abnormal findings: Secondary | ICD-10-CM

## 2023-08-01 DIAGNOSIS — Z9289 Personal history of other medical treatment: Secondary | ICD-10-CM

## 2023-08-01 MED ORDER — NITROFURANTOIN MONOHYD MACRO 100 MG PO CAPS: 100.0000 mg | ORAL_CAPSULE | Freq: Two times a day (BID) | ORAL | 0 refills | Status: AC

## 2023-08-01 MED ORDER — VALACYCLOVIR HCL 1 G PO TABS: 1000.0000 mg | ORAL_TABLET | Freq: Three times a day (TID) | ORAL | 0 refills | Status: AC

## 2023-08-01 MED ORDER — GABAPENTIN 300 MG PO CAPS
300.0000 mg | ORAL_CAPSULE | Freq: Three times a day (TID) | ORAL | 0 refills | Status: DC | PRN
Start: 2023-08-01 — End: 2023-08-16

## 2023-08-01 NOTE — Progress Notes (Signed)
 Breast and Pelvic Exam Patient name: [name] MRN [id number]  Date of birth: March 20, 1946 Chief Complaint:   Breast and Pelvic  History of Present Illness:   [name] is a 78 y.o. G2P2 Caucasian female being seen today for breast and pelvic exam.  Reports she started having pain in the left on Thursday.  This was very uncomfortable.  No recent trauma or falls.  Yesterday, she started having a new rash that is just on the left side.  It is red and rashy looking today.  The pain is a not quite as bad.  Has felt "crappy" the last few days.  No fever but just not great.  She's had fatigue and difficulty sleeping.  She also feels there is some swelling on the left labia/clitoris so wants to know if related.    Does have hx of recurrent UTIs.  Using topical vaginal estradiol cream.  Does keep a prescription for antibiotics on hand.     No LMP recorded. Patient has had a hysterectomy.   Last pap not indicated. Last mammogram: 01/22/2023. Results were: normal. Family h/o breast cancer:  Mother and daughter Last colonoscopy: 2017, Dr. Ewing Schlein.  Follow up 10 years       08/01/2023    1:19 PM 06/14/2023    2:37 PM 06/02/2023   10:01 AM 02/23/2023   10:02 AM 07/19/2022    9:34 AM  Depression screen PHQ 2/9  Decreased Interest 0 0 0 0 0  Down, Depressed, Hopeless 0 0 0 0 0  PHQ - 2 Score 0 0 0 0 0     Review of Systems:   Pertinent items are noted in HPI  Denies any fever, recent achiness, denies vaginal bleeding or discharge, no bowel or recent urinary changes. Pertinent History Reviewed:  Reviewed past medical,surgical, social and family history.  Reviewed problem list, medications and allergies. Physical Assessment:   Vitals:   08/01/23 1316  BP: 139/61  Pulse: 75  Weight: 149 lb 9.6 oz (67.9 kg)  Height: 5\' 5"  (1.651 m)  Body mass index is 24.89 kg/m.        Physical Examination:   General appearance - well appearing, and in no distress  Mental status - alert, oriented to  person, place, and time  Psych:  She has a normal mood and affect  Skin - warm and dry, normal color, no suspicious lesions noted  Chest - effort normal, all lung fields clear to auscultation bilaterally  Heart - normal rate and regular rhythm  Neck:  midline trachea, no thyromegaly or nodules  Breasts - breasts appear normal, no suspicious masses, no skin or nipple changes or  axillary nodes  Abdomen - soft, nontender, nondistended, no masses or organomegaly  Pelvic - VULVA: left labial swelling with multiple areas of small vesicles on red rashy base   Remainder of pelvic exam deferred due to likely shingles diagnosis  Extremities:  No swelling or varicosities noted  Chaperone present for exam, Ina Homes, CMA Assessment & Plan:  1. Encntr for gyn exam (general) (routine) w abnormal findings (Primary) - Pap smear not indicated - Mammogram 01/2023 - Colonoscopy 2017 - Bone mineral density 01/2023 - lab work done with PCP, Dr. Tenny Craw - vaccines reviewed/updated  2. Herpes zoster without complication - valACYclovir (VALTREX) 1000 MG tablet; Take 1 tablet (1,000 mg total) by mouth 3 (three) times daily for 14 days.  Dispense: 42 tablet; Refill: 0 - gabapentin (NEURONTIN) 300 MG capsule; Take 1  capsule (300 mg total) by mouth 3 (three) times daily as needed for up to 15 days.  Dispense: 45 capsule; Refill: 0 - if symptoms are worsening, she is to let me know.  Advised to be seen in ER with any neurologic changes.  Additional risks with shingles discussed.  3. Osteopenia, unspecified location - follow- up with repeat BMD in 1- 2 years  4. Recurrent UTI - nitrofurantoin, macrocrystal-monohydrate, (MACROBID) 100 MG capsule; Take 1 capsule (100 mg total) by mouth 2 (two) times daily.  Dispense: 10 capsule; Refill: 0 - using vaginal estrogen cream that is compounded.  Does not need RF for this right now.      Meds:  Meds ordered this encounter  Medications   valACYclovir (VALTREX) 1000 MG  tablet    Sig: Take 1 tablet (1,000 mg total) by mouth 3 (three) times daily for 14 days.    Dispense:  42 tablet    Refill:  0   gabapentin (NEURONTIN) 300 MG capsule    Sig: Take 1 capsule (300 mg total) by mouth 3 (three) times daily as needed for up to 15 days.    Dispense:  45 capsule    Refill:  0   nitrofurantoin, macrocrystal-monohydrate, (MACROBID) 100 MG capsule    Sig: Take 1 capsule (100 mg total) by mouth 2 (two) times daily.    Dispense:  10 capsule    Refill:  0    Follow-up: Return for recheck 7 - 10 days, vulvar recheck.  Jerene Bears, MD 08/01/2023 1:56 PM

## 2023-08-04 ENCOUNTER — Telehealth (HOSPITAL_BASED_OUTPATIENT_CLINIC_OR_DEPARTMENT_OTHER): Payer: Self-pay | Admitting: *Deleted

## 2023-08-04 MED ORDER — GABAPENTIN 100 MG PO CAPS
100.0000 mg | ORAL_CAPSULE | Freq: Three times a day (TID) | ORAL | 0 refills | Status: AC
Start: 2023-08-04 — End: ?

## 2023-08-04 NOTE — Telephone Encounter (Signed)
 Pt called to report that the 300mg  dosage of gabapentin is causing her to have some dizziness and balance issues. Advised that we could decrease dosage to 100mg  and see if that helps decrease side effects. Pt would like to try 100mg . Rx sent to pharmacy.

## 2023-08-10 ENCOUNTER — Encounter (HOSPITAL_BASED_OUTPATIENT_CLINIC_OR_DEPARTMENT_OTHER): Payer: Self-pay | Admitting: Obstetrics & Gynecology

## 2023-08-10 ENCOUNTER — Ambulatory Visit (HOSPITAL_BASED_OUTPATIENT_CLINIC_OR_DEPARTMENT_OTHER): Admitting: Obstetrics & Gynecology

## 2023-08-10 VITALS — BP 137/84 | HR 72 | Ht 65.0 in | Wt 148.6 lb

## 2023-08-10 DIAGNOSIS — M792 Neuralgia and neuritis, unspecified: Secondary | ICD-10-CM

## 2023-08-10 DIAGNOSIS — B029 Zoster without complications: Secondary | ICD-10-CM | POA: Diagnosis not present

## 2023-08-10 MED ORDER — PREGABALIN 25 MG PO CAPS
25.0000 mg | ORAL_CAPSULE | Freq: Three times a day (TID) | ORAL | 0 refills | Status: AC | PRN
Start: 2023-08-10 — End: ?

## 2023-08-10 NOTE — Progress Notes (Signed)
 GYNECOLOGY  VISIT  CC:   recheck, vulvar shingles  HPI: 78 y.o. G2P2 Married White or Caucasian female here for recheck after being diagnosed with vulvar shingles.  Reports lesions feel like they have all crusted at this point.  Swelling is better.  Pain is some better.  More issues with left hip pain than anything.  Could not tolerated gabapentin.  Made her too sleepy however still having trouble sleeping because of pain on left side and into left hip.  Uncomfortable at night and pain wakes her up and it's hard to get back to sleep.  Using some OTC medications.  Discussed importance of good pain control and to make changes until we can obtain better pain control with minimal side effects.  She is wiling to try something else.  Will change to Lyrica.   Past Medical History:  Diagnosis Date   Contact lens/glasses fitting    wears one contact lt eye   Family history of breast cancer    daughter, diagnosed at 83   Heterozygous factor V Leiden mutation (HCC)    Lupus anticoagulant positive    Osteopenia    PONV (postoperative nausea and vomiting)    Superficial vein thrombosis 1997   Varicose veins of both lower extremities     MEDS:   Current Outpatient Medications on File Prior to Visit  Medication Sig Dispense Refill   acetaminophen (TYLENOL) 500 MG tablet Take 500 mg by mouth every 4 (four) hours as needed for mild pain.     albuterol (VENTOLIN HFA) 108 (90 Base) MCG/ACT inhaler Inhale 2 puffs into the lungs every 6 (six) hours as needed. 18 g 3   calcium carbonate (OS-CAL) 600 MG TABS tablet Take 600 mg by mouth 2 (two) times daily with a meal.     calcium carbonate (TUMS - DOSED IN MG ELEMENTAL CALCIUM) 500 MG chewable tablet Chew 1 tablet by mouth as needed for indigestion or heartburn.     celecoxib (CELEBREX) 100 MG capsule Take 100 mg by mouth daily as needed.     cholecalciferol (VITAMIN D3) 25 MCG (1000 UNIT) tablet Take 1,000 Units by mouth.     famotidine (PEPCID) 20 MG tablet  Take 20 mg by mouth daily.     fish oil-omega-3 fatty acids 1000 MG capsule Take 2 g by mouth daily.     Flaxseed, Linseed, (FLAX SEED OIL PO) Take 1 capsule by mouth daily.     fluticasone furoate-vilanterol (BREO ELLIPTA) 200-25 MCG/ACT AEPB Inhale 1 puff into the lungs daily. (Patient taking differently: Inhale 1 puff into the lungs daily as needed.) 1 each 5   gabapentin (NEURONTIN) 100 MG capsule Take 1 capsule (100 mg total) by mouth 3 (three) times daily. 30 capsule 0   Melatonin 10 MG CAPS Take by mouth at bedtime as needed.     Multiple Vitamins-Minerals (MULTIVITAMIN WITH MINERALS) tablet Take 1 tablet by mouth every other day.     mupirocin ointment (BACTROBAN) 2 % Apply 1 Application topically 2 (two) times daily.     NONFORMULARY OR COMPOUNDED ITEM Estradiol cream 0.02% in 1ml prefilled syringes.  1ml pv twice weekly.  #33month supply. 1 each 3   Plant Sterols and Stanols (CHOLESTOFF PO) Take by mouth daily.     silver sulfADIAZINE (SILVADENE) 1 % cream Apply 1 Application topically daily. 50 g 0   valACYclovir (VALTREX) 1000 MG tablet Take 1 tablet (1,000 mg total) by mouth 3 (three) times daily for 14 days. 42 tablet  0   vitamin C (ASCORBIC ACID) 500 MG tablet Take 500 mg by mouth daily.     XARELTO 10 MG TABS tablet TAKE 1 TABLET(10 MG) BY MOUTH DAILY 90 tablet 2   nitrofurantoin, macrocrystal-monohydrate, (MACROBID) 100 MG capsule Take 1 capsule (100 mg total) by mouth 2 (two) times daily. (Patient not taking: Reported on 08/10/2023) 10 capsule 0   No current facility-administered medications on file prior to visit.    ALLERGIES: Ciprofloxacin, Indomethacin, Ofloxacin, and Adhesive [tape]  SH:  married, non smoker  Review of Systems  Constitutional: Negative.   Genitourinary:        Left vulvar pain  Musculoskeletal:        Left hip pain    PHYSICAL EXAMINATION:    BP 137/84 (BP Location: Right Arm, Patient Position: Sitting)   Pulse 72   Ht 5\' 5"  (1.651 m)   Wt 148  lb 9.6 oz (67.4 kg)   BMI 24.73 kg/m     General appearance: alert, cooperative and appears stated age  Lymph:  no inguinal LAD noted Pelvic: External genitalia:  all lesions on left side have crusted, no edema or erythema of skin              Urethra:  normal appearing urethra with no masses, tenderness or lesions               Assessment/Plan: 1. Herpes zoster without complication (Primary) - improved exam - still taking valtrex 1 gram TID x 14 days.  Does not need RF.  Will complete entire course.    2. Nerve pain - will stop gabapentin and add lyrica.  Other options of cymbalta or amitriptyline discussed.   - pregabalin (LYRICA) 25 MG capsule; Take 1 capsule (25 mg total) by mouth 3 (three) times daily as needed.  Dispense: 40 capsule; Refill: 0 - she will give update on before the weekend

## 2023-08-11 DIAGNOSIS — Z131 Encounter for screening for diabetes mellitus: Secondary | ICD-10-CM | POA: Diagnosis not present

## 2023-08-11 DIAGNOSIS — E7800 Pure hypercholesterolemia, unspecified: Secondary | ICD-10-CM | POA: Diagnosis not present

## 2023-08-15 DIAGNOSIS — E7800 Pure hypercholesterolemia, unspecified: Secondary | ICD-10-CM | POA: Diagnosis not present

## 2023-08-15 DIAGNOSIS — Z6825 Body mass index (BMI) 25.0-25.9, adult: Secondary | ICD-10-CM | POA: Diagnosis not present

## 2023-08-15 DIAGNOSIS — Z0000 Encounter for general adult medical examination without abnormal findings: Secondary | ICD-10-CM | POA: Diagnosis not present

## 2023-08-15 DIAGNOSIS — M545 Low back pain, unspecified: Secondary | ICD-10-CM | POA: Diagnosis not present

## 2023-10-05 ENCOUNTER — Ambulatory Visit (HOSPITAL_BASED_OUTPATIENT_CLINIC_OR_DEPARTMENT_OTHER)

## 2023-10-05 ENCOUNTER — Encounter (HOSPITAL_BASED_OUTPATIENT_CLINIC_OR_DEPARTMENT_OTHER): Payer: Self-pay

## 2023-10-05 ENCOUNTER — Other Ambulatory Visit (HOSPITAL_COMMUNITY): Payer: Self-pay | Admitting: Family Medicine

## 2023-10-05 VITALS — BP 149/69 | HR 73 | Wt 149.2 lb

## 2023-10-05 DIAGNOSIS — N390 Urinary tract infection, site not specified: Secondary | ICD-10-CM | POA: Diagnosis not present

## 2023-10-05 LAB — POCT URINALYSIS DIPSTICK
Bilirubin, UA: NEGATIVE
Glucose, UA: NEGATIVE
Ketones, UA: NEGATIVE
Nitrite, UA: NEGATIVE
Protein, UA: NEGATIVE
Spec Grav, UA: <=1.005 — AB (ref 1.010–1.025)
Urobilinogen, UA: 0.2 U/dL
pH, UA: 6 (ref 5.0–8.0)

## 2023-10-05 NOTE — Progress Notes (Signed)
 Patient came in today to have her urine evaluated. Patient states she just finished up some Macrobid  that she feels like was not successful. She did not want us  to call in the protocol medication. She will wait on the results of her urine culture to come back. tbw

## 2023-10-08 ENCOUNTER — Other Ambulatory Visit (HOSPITAL_BASED_OUTPATIENT_CLINIC_OR_DEPARTMENT_OTHER): Payer: Self-pay | Admitting: Certified Nurse Midwife

## 2023-10-08 ENCOUNTER — Ambulatory Visit (HOSPITAL_BASED_OUTPATIENT_CLINIC_OR_DEPARTMENT_OTHER): Payer: Self-pay | Admitting: Certified Nurse Midwife

## 2023-10-08 DIAGNOSIS — B9620 Unspecified Escherichia coli [E. coli] as the cause of diseases classified elsewhere: Secondary | ICD-10-CM

## 2023-10-08 LAB — URINE CULTURE

## 2023-10-08 MED ORDER — CEPHALEXIN 500 MG PO CAPS
500.0000 mg | ORAL_CAPSULE | Freq: Two times a day (BID) | ORAL | 0 refills | Status: AC
Start: 2023-10-08 — End: 2023-10-15

## 2023-11-08 ENCOUNTER — Other Ambulatory Visit (HOSPITAL_BASED_OUTPATIENT_CLINIC_OR_DEPARTMENT_OTHER): Payer: Self-pay

## 2023-11-08 ENCOUNTER — Other Ambulatory Visit (HOSPITAL_BASED_OUTPATIENT_CLINIC_OR_DEPARTMENT_OTHER)

## 2023-11-08 DIAGNOSIS — N390 Urinary tract infection, site not specified: Secondary | ICD-10-CM | POA: Diagnosis not present

## 2023-11-08 NOTE — Progress Notes (Signed)
 Patient came in today to give a urin specimen to send for culture. tbw

## 2023-11-10 ENCOUNTER — Ambulatory Visit (HOSPITAL_BASED_OUTPATIENT_CLINIC_OR_DEPARTMENT_OTHER): Payer: Self-pay | Admitting: Certified Nurse Midwife

## 2023-11-10 LAB — URINE CULTURE

## 2023-11-21 ENCOUNTER — Inpatient Hospital Stay: Attending: Hematology & Oncology

## 2023-11-21 ENCOUNTER — Inpatient Hospital Stay: Admitting: Family

## 2023-11-21 DIAGNOSIS — D6851 Activated protein C resistance: Secondary | ICD-10-CM | POA: Insufficient documentation

## 2023-11-21 DIAGNOSIS — I8000 Phlebitis and thrombophlebitis of superficial vessels of unspecified lower extremity: Secondary | ICD-10-CM

## 2023-11-21 DIAGNOSIS — R760 Raised antibody titer: Secondary | ICD-10-CM

## 2023-11-21 DIAGNOSIS — Z79899 Other long term (current) drug therapy: Secondary | ICD-10-CM | POA: Insufficient documentation

## 2023-11-21 DIAGNOSIS — Z7901 Long term (current) use of anticoagulants: Secondary | ICD-10-CM | POA: Diagnosis not present

## 2023-11-21 DIAGNOSIS — Z86718 Personal history of other venous thrombosis and embolism: Secondary | ICD-10-CM | POA: Insufficient documentation

## 2023-11-21 DIAGNOSIS — D6859 Other primary thrombophilia: Secondary | ICD-10-CM

## 2023-11-21 LAB — CBC WITH DIFFERENTIAL (CANCER CENTER ONLY)
Abs Immature Granulocytes: 0.02 K/uL (ref 0.00–0.07)
Basophils Absolute: 0 K/uL (ref 0.0–0.1)
Basophils Relative: 1 %
Eosinophils Absolute: 0.1 K/uL (ref 0.0–0.5)
Eosinophils Relative: 1 %
HCT: 39.6 % (ref 36.0–46.0)
Hemoglobin: 13.7 g/dL (ref 12.0–15.0)
Immature Granulocytes: 0 %
Lymphocytes Relative: 35 %
Lymphs Abs: 2.3 K/uL (ref 0.7–4.0)
MCH: 33.2 pg (ref 26.0–34.0)
MCHC: 34.6 g/dL (ref 30.0–36.0)
MCV: 95.9 fL (ref 80.0–100.0)
Monocytes Absolute: 0.4 K/uL (ref 0.1–1.0)
Monocytes Relative: 6 %
Neutro Abs: 3.7 K/uL (ref 1.7–7.7)
Neutrophils Relative %: 57 %
Platelet Count: 214 K/uL (ref 150–400)
RBC: 4.13 MIL/uL (ref 3.87–5.11)
RDW: 12.2 % (ref 11.5–15.5)
WBC Count: 6.6 K/uL (ref 4.0–10.5)
nRBC: 0 % (ref 0.0–0.2)

## 2023-11-21 LAB — CMP (CANCER CENTER ONLY)
ALT: 15 U/L (ref 0–44)
AST: 20 U/L (ref 15–41)
Albumin: 4.2 g/dL (ref 3.5–5.0)
Alkaline Phosphatase: 68 U/L (ref 38–126)
Anion gap: 7 (ref 5–15)
BUN: 17 mg/dL (ref 8–23)
CO2: 28 mmol/L (ref 22–32)
Calcium: 9.6 mg/dL (ref 8.9–10.3)
Chloride: 106 mmol/L (ref 98–111)
Creatinine: 0.87 mg/dL (ref 0.44–1.00)
GFR, Estimated: >60 mL/min (ref >60)
Glucose, Bld: 111 mg/dL — ABNORMAL HIGH (ref 70–99)
Potassium: 4.6 mmol/L (ref 3.5–5.1)
Sodium: 141 mmol/L (ref 135–145)
Total Bilirubin: 0.5 mg/dL (ref 0.0–1.2)
Total Protein: 6.7 g/dL (ref 6.5–8.1)

## 2023-11-21 NOTE — Progress Notes (Signed)
 Hematology and Oncology Follow Up Visit  [name] [id number] 12/12/1945 78 y.o. 11/21/2023   Principle Diagnosis:  Factor V Leiden -heterozygous/lupus anticoagulant  (+) Remote history of left lower extremity superficial thrombus Continued right superficial thrombus involving the great saphenous vein, now greater that 10 cm from the saphenofemoral junction  Superficial thrombus involving the right thigh varicosities, proximal and mid small saphenous veins   Current Therapy:        Xarelto  20 mg PO daily - reduced to 10 mg PO daily on 05/14/2022   Interim History:  [name] is here today for follow-up. She is doing well and has recovered from a bout with shingles earlier this year. No lingering pain thankfully. She has not noted any abnormal blood loss, bruising or petechiae on Xarelto .  Mild SOB with exacerbation of asthma.  No fever, chills, n/v, cough, rash, dizziness, chest pain, palpitations, abdominal pain or changes in bowel or bladder habits.  No swelling, numbness or tingling in her extremities. Pedal pulses are 1+.  No falls or syncope reported.  She has good balance and states that her left ankle is weak.  Appetite and hydration are good. Weight is stable at 149 lbs.   ECOG Performance Status: 0 - Asymptomatic  Medications:  Allergies as of 11/21/2023       Reactions   Ciprofloxacin  Itching, Swelling   Swelling lips   Indomethacin Other (See Comments)   Felt chest pain, possible esophageal spasm   Ofloxacin Itching, Swelling   Adhesive [tape] Rash   Tapes cause blister, need to use paper tape        Medication List        Accurate as of November 21, 2023  1:46 PM. If you have any questions, ask your nurse or doctor.          acetaminophen  500 MG tablet Commonly known as: TYLENOL  Take 500 mg by mouth every 4 (four) hours as needed for mild pain.   albuterol  108 (90 Base) MCG/ACT inhaler Commonly known as: VENTOLIN  HFA Inhale 2 puffs into the lungs every  6 (six) hours as needed.   ascorbic acid 500 MG tablet Commonly known as: VITAMIN C Take 500 mg by mouth daily.   calcium carbonate 500 MG chewable tablet Commonly known as: TUMS - dosed in mg elemental calcium Chew 1 tablet by mouth as needed for indigestion or heartburn.   calcium carbonate 600 MG Tabs tablet Commonly known as: OS-CAL Take 600 mg by mouth 2 (two) times daily with a meal.   celecoxib 100 MG capsule Commonly known as: CELEBREX Take 100 mg by mouth daily as needed.   cholecalciferol 25 MCG (1000 UNIT) tablet Commonly known as: VITAMIN D3 Take 1,000 Units by mouth.   CHOLESTOFF PO Take by mouth daily.   famotidine 20 MG tablet Commonly known as: PEPCID Take 20 mg by mouth daily.   fish oil-omega-3 fatty acids 1000 MG capsule Take 2 g by mouth daily.   FLAX SEED OIL PO Take 1 capsule by mouth daily.   fluticasone  furoate-vilanterol 200-25 MCG/ACT Aepb Commonly known as: Breo Ellipta  Inhale 1 puff into the lungs daily. What changed:  when to take this reasons to take this   gabapentin  100 MG capsule Commonly known as: Neurontin  Take 1 capsule (100 mg total) by mouth 3 (three) times daily.   Melatonin 10 MG Caps Take by mouth at bedtime as needed.   multivitamin with minerals tablet Take 1 tablet by mouth every other  day.   mupirocin ointment 2 % Commonly known as: BACTROBAN Apply 1 Application topically 2 (two) times daily.   nitrofurantoin  (macrocrystal-monohydrate) 100 MG capsule Commonly known as: MACROBID  Take 1 capsule (100 mg total) by mouth 2 (two) times daily.   NONFORMULARY OR COMPOUNDED ITEM Estradiol  cream 0.02% in 1ml prefilled syringes.  1ml pv twice weekly.  #18month supply.   pregabalin  25 MG capsule Commonly known as: Lyrica  Take 1 capsule (25 mg total) by mouth 3 (three) times daily as needed.   silver  sulfADIAZINE  1 % cream Commonly known as: Silvadene  Apply 1 Application topically daily.   Xarelto  10 MG Tabs  tablet Generic drug: rivaroxaban  TAKE 1 TABLET(10 MG) BY MOUTH DAILY        Allergies:  Allergies  Allergen Reactions   Ciprofloxacin  Itching and Swelling    Swelling lips   Indomethacin Other (See Comments)    Felt chest pain, possible esophageal spasm   Ofloxacin Itching and Swelling   Adhesive [Tape] Rash    Tapes cause blister, need to use paper tape     Past Medical History, Surgical history, Social history, and Family History were reviewed and updated.  Review of Systems: All other 10 point review of systems is negative.   Physical Exam:  height is 5' 5.5 (1.664 m) (pended) and weight is 149 lb 6.4 oz (67.8 kg) (pended). Her oral temperature is 98.1 F (36.7 C) (pended). Her blood pressure is 127/67 (pended) and her pulse is 69 (pended). Her respiration is 17 (pended) and oxygen saturation is 97% (pended).   Wt Readings from Last 3 Encounters:  11/21/23 (P) 149 lb 6.4 oz (67.8 kg)  10/05/23 149 lb 3.2 oz (67.7 kg)  08/10/23 148 lb 9.6 oz (67.4 kg)    Ocular: Sclerae unicteric, pupils equal, round and reactive to light Ear-nose-throat: Oropharynx clear, dentition fair Lymphatic: No cervical or supraclavicular adenopathy Lungs no rales or rhonchi, good excursion bilaterally Heart regular rate and rhythm, no murmur appreciated Abd soft, nontender, positive bowel sounds MSK no focal spinal tenderness, no joint edema Neuro: non-focal, well-oriented, appropriate affect Breasts: Deferred   Lab Results  Component Value Date   WBC 6.6 11/21/2023   HGB 13.7 11/21/2023   HCT 39.6 11/21/2023   MCV 95.9 11/21/2023   PLT 214 11/21/2023   No results found for: FERRITIN, IRON, TIBC, UIBC, IRONPCTSAT Lab Results  Component Value Date   RBC 4.13 11/21/2023   No results found for: KPAFRELGTCHN, LAMBDASER, KAPLAMBRATIO No results found for: IGGSERUM, IGA, IGMSERUM No results found for: STEPHANY CARLOTA BENSON MARKEL EARLA JOANNIE DOC VICK, SPEI   Chemistry      Component Value Date/Time   NA 139 06/14/2023 1506   K 4.3 06/14/2023 1506   CL 103 06/14/2023 1506   CO2 25 06/14/2023 1506   BUN 17 06/14/2023 1506   CREATININE 0.75 06/14/2023 1506      Component Value Date/Time   CALCIUM 9.7 06/14/2023 1506   ALKPHOS 82 05/27/2023 1400   AST 19 06/14/2023 1506   AST 35 05/27/2023 1400   ALT 19 06/14/2023 1506   ALT 40 05/27/2023 1400   BILITOT 0.3 06/14/2023 1506   BILITOT 0.4 05/27/2023 1400       Impression and Plan: [name] is a very pleasant 78 yo caucasian female with significant history of thrombotic events starting 25 years ago with a left lower extremity superficial thrombus post hysterectomy and long car ride.  She has Factor V Leiden as well as  positive lupus anticoagulant.  She is on long term anticoagulation with Xarelto  10 mg PO daily.  Follow-up in 1 year per patient preference.    Lauraine Pepper, NP 7/14/20251:46 PM

## 2023-11-25 ENCOUNTER — Ambulatory Visit: Payer: PPO | Admitting: Family

## 2023-11-25 ENCOUNTER — Other Ambulatory Visit: Payer: PPO

## 2023-12-05 ENCOUNTER — Other Ambulatory Visit: Payer: Self-pay | Admitting: Hematology & Oncology

## 2023-12-05 DIAGNOSIS — D6851 Activated protein C resistance: Secondary | ICD-10-CM

## 2023-12-05 DIAGNOSIS — I8000 Phlebitis and thrombophlebitis of superficial vessels of unspecified lower extremity: Secondary | ICD-10-CM

## 2023-12-05 DIAGNOSIS — R760 Raised antibody titer: Secondary | ICD-10-CM

## 2023-12-05 DIAGNOSIS — D6859 Other primary thrombophilia: Secondary | ICD-10-CM

## 2023-12-07 ENCOUNTER — Other Ambulatory Visit (HOSPITAL_BASED_OUTPATIENT_CLINIC_OR_DEPARTMENT_OTHER): Payer: Self-pay | Admitting: Obstetrics & Gynecology

## 2023-12-07 DIAGNOSIS — Z1231 Encounter for screening mammogram for malignant neoplasm of breast: Secondary | ICD-10-CM

## 2023-12-27 DIAGNOSIS — H524 Presbyopia: Secondary | ICD-10-CM | POA: Diagnosis not present

## 2023-12-27 DIAGNOSIS — H40013 Open angle with borderline findings, low risk, bilateral: Secondary | ICD-10-CM | POA: Diagnosis not present

## 2023-12-27 DIAGNOSIS — H26492 Other secondary cataract, left eye: Secondary | ICD-10-CM | POA: Diagnosis not present

## 2023-12-27 DIAGNOSIS — D3132 Benign neoplasm of left choroid: Secondary | ICD-10-CM | POA: Diagnosis not present

## 2024-01-16 ENCOUNTER — Other Ambulatory Visit: Payer: Self-pay | Admitting: *Deleted

## 2024-01-16 MED ORDER — FLUTICASONE FUROATE-VILANTEROL 200-25 MCG/ACT IN AEPB: 1.0000 | INHALATION_SPRAY | Freq: Every day | RESPIRATORY_TRACT | 1 refills | Status: DC

## 2024-01-23 ENCOUNTER — Ambulatory Visit (HOSPITAL_BASED_OUTPATIENT_CLINIC_OR_DEPARTMENT_OTHER): Admitting: Radiology

## 2024-01-23 ENCOUNTER — Ambulatory Visit
Admission: RE | Admit: 2024-01-23 | Discharge: 2024-01-23 | Disposition: A | Discharge status: Home / Self Care | Source: Ambulatory Visit | Attending: Obstetrics & Gynecology | Admitting: Obstetrics & Gynecology

## 2024-01-23 DIAGNOSIS — Z1231 Encounter for screening mammogram for malignant neoplasm of breast: Secondary | ICD-10-CM

## 2024-01-30 DIAGNOSIS — H26492 Other secondary cataract, left eye: Secondary | ICD-10-CM | POA: Diagnosis not present

## 2024-02-14 ENCOUNTER — Ambulatory Visit: Admitting: Internal Medicine

## 2024-02-21 ENCOUNTER — Ambulatory Visit: Admitting: Internal Medicine

## 2024-02-21 ENCOUNTER — Encounter: Payer: Self-pay | Admitting: Internal Medicine

## 2024-02-21 VITALS — BP 120/80 | HR 67 | Temp 98.0°F | Ht 63.0 in | Wt 147.0 lb

## 2024-02-21 DIAGNOSIS — J4520 Mild intermittent asthma, uncomplicated: Secondary | ICD-10-CM

## 2024-02-21 DIAGNOSIS — K219 Gastro-esophageal reflux disease without esophagitis: Secondary | ICD-10-CM | POA: Diagnosis not present

## 2024-02-21 DIAGNOSIS — T50905A Adverse effect of unspecified drugs, medicaments and biological substances, initial encounter: Secondary | ICD-10-CM

## 2024-02-21 MED ORDER — MOMETASONE FURO-FORMOTEROL FUM 100-5 MCG/ACT IN AERO: 2.0000 | INHALATION_SPRAY | Freq: Two times a day (BID) | RESPIRATORY_TRACT | 11 refills | Status: DC

## 2024-02-21 NOTE — Patient Instructions (Signed)
 It was a pleasure to see you today!  Please schedule follow up with Dr Pleas in 1 year.  If my schedule is not open yet, we will contact you with a reminder closer to that time. Please call [phone] if you haven't heard from us  a month before, and always call us  sooner if issues or concerns arise. You can also send us  a message through MyChart, but but aware that this is not to be used for urgent issues and it may take up to 5-7 days to receive a reply. Please be aware that you will likely be able to view your results before I have a chance to respond to them. Please give us  5 business days to respond to any non-urgent results.    Because of the coughing related to the [facility] we will switched you to dulera 2 puffs once a day. If you are experiencing shortness of breath, coughing, wheezing, increase to 2 puffs twice a day.   Con add albuterol  inhaler as needed for wheezing and shortness of breath if this is not enough.   Continue the medication for reflux.

## 2024-02-21 NOTE — Progress Notes (Signed)
 [name]    [id number]    11-10-1945  Primary Care Physician:Ross, Carlin Redbird, MD Date of Appointment: 02/21/2024 Established Patient Visit  Chief complaint:   Chief Complaint  Patient presents with   Asthma     HPI: [name] is a 78 y.o. woman with medical history of reflux and chronic cough - mild intermittent asthma, cough variant.   Interval Updates: Here for follow up. Has been taking Breo more frequently 4-5 times/day. Albuteorl use 1-2 times/week.  Feels breo is making her cough more. No thrush.   No interval episodes of asthma exacerbation/bronchitis  Reflux controlled. Takes H2 blocker and occasional tums  No dyspnea that limits activities.   I have reviewed the patient's family social and past medical history and updated as appropriate.   Past Medical History:  Diagnosis Date   Contact lens/glasses fitting    wears one contact lt eye   Family history of breast cancer    daughter, diagnosed at 26   Heterozygous factor V Leiden mutation    Lupus anticoagulant positive    Osteopenia    PONV (postoperative nausea and vomiting)    Superficial vein thrombosis 1997   Varicose veins of both lower extremities     Past Surgical History:  Procedure Laterality Date   BARTHOLIN CYST MARSUPIALIZATION     BARTHOLIN CYST MARSUPIALIZATION Right 08/19/2014   Procedure: BARTHOLIN CYST MARSUPIALIZATION;  Surgeon: Ronal GORMAN Pinal, MD;  Location: [facility];  Service: Gynecology;  Laterality: Right;   CARPAL TUNNEL RELEASE Right 12/20/2012   Procedure: CARPAL TUNNEL RELEASE;  Surgeon: Arley JONELLE Curia, MD;  Location: [facility];  Service: Orthopedics;  Laterality: Right;   CARPOMETACARPEL SUSPENSION PLASTY Right 05/29/2019   Procedure: CARPOMETACARPEL [facility]) SUSPENSION PLASTY excision trapexzium tendon transfer, microlink suspection plasty;  Surgeon: Curia Arley, MD;  Location: [facility];  Service: Orthopedics;  Laterality: Right;   axillary block   CATARACT EXTRACTION Right 2009       CESAREAN SECTION  1973, 1977   x2   EAR CYST EXCISION Left 08/15/2014   Procedure: LEFT THUMB DEBRIDEMENT INTERPHALANGEAL JOINT LEFT THUMB REMOVAL MUCOID CYST ;  Surgeon: Arley Curia, MD;  Location: [facility];  Service: Orthopedics;  Laterality: Left;   FINGER SURGERY  2002   lt long finger mass   GANGLION CYST EXCISION Left 2/02   TOTAL ABDOMINAL HYSTERECTOMY W/ BILATERAL SALPINGOOPHORECTOMY  1997   TUBAL LIGATION  1977    Family History  Problem Relation Age of Onset   Heart disease Mother    Hyperlipidemia Mother    Thyroid  disease Mother    Osteoporosis Mother    Dementia Mother    Breast cancer Mother 60   Scleroderma Mother    Scleroderma Father    Diabetes Father    Heart disease Father    Cancer Father        prostate   Cancer Brother        Eye   Cancer Brother        gleoblastoma   Breast cancer Daughter 26    Social History   Occupational History   Not on file  Tobacco Use   Smoking status: Never   Smokeless tobacco: Never  Vaping Use   Vaping status: Never Used  Substance and Sexual Activity   Alcohol use: Yes    Comment: glass of wine weeklu   Drug use: No   Sexual  activity: Not Currently    Partners: Male    Birth control/protection: Surgical    Comment: Hysterectomy     Physical Exam: Blood pressure 120/80, pulse 67, temperature 98 F (36.7 C), temperature source Oral, height 5' 3 (1.6 m), weight 147 lb (66.7 kg), SpO2 94%.  Gen:      No distress, normal voice ENT: no thrush Lungs:    Ctab no wheeze CV:        RRR no mrg   Data Reviewed: Imaging: I have personally reviewed the chest xray obtained today shows normal hemidiaphragms, no acute pulmonary process  PFTs:   Labs:  Immunization status: Immunization History  Administered Date(s) Administered   Fluad Quad(high Dose 65+) 02/13/2019, 02/21/2022   INFLUENZA, HIGH DOSE SEASONAL PF 02/08/2018, 03/05/2020    Influenza-Unspecified 02/22/2023   PFIZER(Purple Top)SARS-COV-2 Vaccination 06/27/2019, 07/05/2019, 02/14/2020, 11/17/2020   Pneumococcal Conjugate-13 03/14/2014   Pneumococcal Polysaccharide-23 04/09/2015   Tdap 09/06/2005, 05/17/2016, 04/16/2020   Zoster, Live 09/08/2006    External Records Personally Reviewed: heme/onc  Assessment:  Mild intermittent asthma, controlled GERD Adverse effect to medication  Plan/Recommendations:  Because of the coughing related to the Breo we will switched you to dulera 2 puffs once a day. If you are experiencing shortness of breath, coughing, wheezing, increase to 2 puffs twice a day.   Con add albuterol  inhaler as needed for wheezing and shortness of breath if this is not enough.   Continue the medication for reflux.   She has already gotten her flu shot.   Return to Care: Return in about 1 year (around 02/20/2025) for Dr Pleas.   Verdon Gore, MD Pulmonary and Critical Care Medicine [facility] Office:[phone]

## 2024-02-22 ENCOUNTER — Telehealth: Payer: Self-pay

## 2024-02-22 MED ORDER — ALBUTEROL SULFATE HFA 108 (90 BASE) MCG/ACT IN AERS: 2.0000 | INHALATION_SPRAY | Freq: Four times a day (QID) | RESPIRATORY_TRACT | 3 refills | Status: AC | PRN

## 2024-02-22 NOTE — Telephone Encounter (Signed)
 Copied from CRM [id number]. Topic: Clinical - Prescription Issue >> Feb 22, 2024  9:16 AM Benton KIDD wrote: Reason for RMF:ejupzwu is calling because she  saw dr meade for her yearly told her i needed a refill on the abuterol (VENTOLIN  HFA) 108 (90 Base) MCG/ACT inhaler [facility] and it needs to go to [id number] - [place], [place] - [address] [place] [address] Phone: [phone] Fax: [phone] Hours: Not open 24 hours   I called and spoke to pt. Pt states she just needed a rx for her albuterol  sent to [facility] instead of [facility]. I sent this in for her to the preferred pharmacy and it does not look like another rx was sent to [facility]. NFN

## 2024-02-27 ENCOUNTER — Other Ambulatory Visit (HOSPITAL_COMMUNITY): Payer: Self-pay

## 2024-02-27 ENCOUNTER — Telehealth: Payer: Self-pay

## 2024-02-27 NOTE — Telephone Encounter (Signed)
 P.A received from [facility] for Dulera,100-5 MCG/ACT AEROSOL (Cover My Meds )   Key- [id number] Patient Last nameMystery Schrupp DOB- June 01, 1945  Tele- [phone]        fax- [phone]

## 2024-02-27 NOTE — Telephone Encounter (Signed)
 Copied from CRM #[id number]. Topic: Clinical - Prescription Issue >> Feb 27, 2024  1:07 PM Rozanna MATSU wrote: Reason for CRM:  pt calling to check the PA on her med Dulera.Pt checking the status.  Dr. Meade can you please advise of an alternative.

## 2024-03-01 ENCOUNTER — Telehealth: Payer: Self-pay | Admitting: *Deleted

## 2024-03-01 NOTE — Telephone Encounter (Signed)
 Copied from CRM #[id number]. Topic: Clinical - Prescription Issue >> Feb 27, 2024  1:07 PM Rozanna MATSU wrote: Reason for CRM:  pt calling to check the PA on her med Dulera.Pt checking the status. >> Mar 01, 2024  1:37 PM Leila BROCKS wrote: Patient [phone] and after 3 pm [phone] states Dr. Meade changed medication Breo to Dulera and the pharmacy does not have the medication. Patient has not heard from the office regarding [facility] PA, called earlier this week. Per CAL, waiting for Dr. Correne consult and she's not in the office this week, will be back in the office next Monday. Patient states about 2 years ago, this happen last time with Dulera not being covered. Please advise and call back.   [id number] [place], [place] - [address] [place] [address] Phone: [phone] Fax: [phone]  Per test claims, covered alternatives to [facility] include:    Breo Ellipta - $0.00 Generic Advair Diskus/Wixela- $0.00 Advair HFA- $0.00 Breyna 10.3gm- $0.00  ATC patient on both numbers and left VM on both numbers.  Sent mychart message as well.

## 2024-03-01 NOTE — Telephone Encounter (Signed)
 Copied from CRM #[id number]. Topic: Clinical - Prescription Issue >> Feb 27, 2024  1:07 PM Rozanna MATSU wrote: Reason for CRM:  pt calling to check the PA on her med Dulera.Pt checking the status. >> Mar 01, 2024  1:37 PM Leila BROCKS wrote: Patient [phone] and after 3 pm [phone] states Dr. Meade changed medication Breo to Dulera and the pharmacy does not have the medication. Patient has not heard from the office regarding [facility] PA, called earlier this week. Per CAL, waiting for Dr. Correne consult and she's not in the office this week, will be back in the office next Monday. Patient states about 2 years ago, this happen last time with Dulera not being covered. Please advise and call back.   [id number] [place], [place] - [address] [place] [address] Phone: [phone] Fax: [phone]  Per test claims, covered alternatives to Dulera include:    Breo Ellipta - $0.00 Generic Advair Diskus/Wixela- $0.00 Advair HFA- $0.00 Breyna 10.3gm- $0.00  Dr. Meade, please advise on alternative medication for the patient.  Thank you.

## 2024-03-02 NOTE — Telephone Encounter (Signed)
 Patient responded by mychart: Will wait to here.

## 2024-03-05 MED ORDER — FLUTICASONE-SALMETEROL 115-21 MCG/ACT IN AERO: 2.0000 | INHALATION_SPRAY | Freq: Two times a day (BID) | RESPIRATORY_TRACT | 12 refills | Status: AC

## 2024-03-05 NOTE — Addendum Note (Signed)
 Addended by: Deante Blough on: 03/05/2024 01:19 PM   Modules accepted: Orders

## 2024-03-05 NOTE — Telephone Encounter (Signed)
Advair hfa sent to pharmacy

## 2024-05-22 NOTE — Progress Notes (Signed)
 " [facility] - Clinic Note  05/30/2024     CHIEF COMPLAINT Patient presents for Retina Follow Up  HISTORY OF PRESENT ILLNESS: [name] is a 79 y.o. female who presents to the clinic today for:   HPI     Retina Follow Up   Patient presents with  Other.  In left eye.  Severity is moderate.  Duration of 12 months.  Since onset it is stable.  I, the attending physician,  performed the HPI with the patient and updated documentation appropriately.        Comments   Patient denies any changes in vision, pt has had yag laser within the last year, she's pretty sure it was OS. Pt is using Systane complete BID OU.      Last edited by Valdemar Rogue, MD on 06/07/2024  4:29 PM.     Patient states she's doing well, sees Dr. Waylan soon for VF. Did have a YAG w/ him recently, unsure which eye.   Referring physician: Okey Carlin Redbird, MD [address] [place],  [place] [address]  HISTORICAL INFORMATION:   Selected notes from the MEDICAL RECORD NUMBER Referred by Dr. 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 (Ophthalmic Drugs)   No current facility-administered medications for this visit. (Ophthalmic Drugs)   Current Outpatient Medications (Other)  Medication Sig   acetaminophen  (TYLENOL ) 500 MG tablet Take 500 mg by mouth every 4 (four) hours as needed for mild pain.   albuterol  (VENTOLIN  HFA) 108 (90 Base) MCG/ACT inhaler Inhale 2 puffs into the lungs every 6 (six) hours as needed.   calcium carbonate (OS-CAL) 600 MG TABS tablet Take 600 mg by mouth 2 (two) times daily with a meal.   calcium carbonate (TUMS - DOSED IN MG ELEMENTAL CALCIUM) 500 MG chewable tablet Chew 1 tablet by mouth as needed for indigestion or heartburn.   celecoxib (CELEBREX) 100 MG capsule Take 100 mg by mouth daily as needed.   cholecalciferol (VITAMIN D3) 25 MCG (1000 UNIT) tablet Take 1,000 Units by mouth.    famotidine (PEPCID) 20 MG tablet Take 20 mg by mouth daily.   fish oil-omega-3 fatty acids 1000 MG capsule Take 2 g by mouth daily. (Patient taking differently: Take 2 g by mouth daily. prn)   Flaxseed, Linseed, (FLAX SEED OIL PO) Take 1 capsule by mouth daily.   fluticasone -salmeterol (ADVAIR HFA) 115-21 MCG/ACT inhaler Inhale 2 puffs into the lungs 2 (two) times daily.   gabapentin  (NEURONTIN ) 100 MG capsule Take 1 capsule (100 mg total) by mouth 3 (three) times daily. (Patient not taking: Reported on 02/21/2024)   Melatonin 10 MG CAPS Take by mouth at bedtime as needed.   Multiple Vitamins-Minerals (MULTIVITAMIN WITH MINERALS) tablet Take 1 tablet by mouth every other day. (Patient taking differently: Take 1 tablet by mouth every other day. Taking 4 times a week)   mupirocin ointment (BACTROBAN) 2 % Apply 1 Application topically 2 (two) times daily. (Patient not taking: Reported on 02/21/2024)   nitrofurantoin , macrocrystal-monohydrate, (MACROBID ) 100 MG capsule Take 1 capsule (100 mg total) by mouth 2 (two) times daily. (Patient not taking: Reported on 02/21/2024)   NONFORMULARY OR COMPOUNDED ITEM Estradiol  cream 0.02% in 1ml prefilled syringes.  1ml pv twice weekly.  #21month supply.   Plant Sterols and Stanols (CHOLESTOFF PO) Take by mouth daily.   pregabalin  (LYRICA ) 25 MG capsule Take 1 capsule (25 mg total)  by mouth 3 (three) times daily as needed. (Patient not taking: Reported on 02/21/2024)   silver  sulfADIAZINE  (SILVADENE ) 1 % cream Apply 1 Application topically daily. (Patient not taking: Reported on 02/21/2024)   vitamin C (ASCORBIC ACID) 500 MG tablet Take 500 mg by mouth daily.   XARELTO  10 MG TABS tablet TAKE 1 TABLET BY MOUTH DAILY   No current facility-administered medications for this visit. (Other)   REVIEW OF SYSTEMS: ROS   Positive for: Musculoskeletal, Eyes, Respiratory Negative for: Constitutional, Gastrointestinal, Neurological, Skin, Genitourinary, HENT, Endocrine,  Cardiovascular, Psychiatric, Allergic/Imm, Heme/Lymph Last edited by Elnor Avelina RAMAN, COT on 05/30/2024  8:48 AM.       ALLERGIES Allergies  Allergen Reactions   Ciprofloxacin  Itching and Swelling    Swelling lips   Indomethacin Other (See Comments)    Felt chest pain, possible esophageal spasm   Ofloxacin Itching and Swelling   Adhesive [Tape] Rash    Tapes cause blister, need to use paper tape    PAST MEDICAL HISTORY Past Medical History:  Diagnosis Date   Contact lens/glasses fitting    wears one contact lt eye   Family history of breast cancer    daughter, diagnosed at 39   Heterozygous factor V Leiden mutation    Lupus anticoagulant positive    Osteopenia    PONV (postoperative nausea and vomiting)    Superficial vein thrombosis 1997   Varicose veins of both lower extremities    Past Surgical History:  Procedure Laterality Date   BARTHOLIN CYST MARSUPIALIZATION     BARTHOLIN CYST MARSUPIALIZATION Right 08/19/2014   Procedure: BARTHOLIN CYST MARSUPIALIZATION;  Surgeon: Ronal RAMAN Pinal, MD;  Location: [facility];  Service: Gynecology;  Laterality: Right;   CARPAL TUNNEL RELEASE Right 12/20/2012   Procedure: CARPAL TUNNEL RELEASE;  Surgeon: Arley JONELLE Curia, MD;  Location: [facility];  Service: Orthopedics;  Laterality: Right;   CARPOMETACARPEL SUSPENSION PLASTY Right 05/29/2019   Procedure: CARPOMETACARPEL [facility]) SUSPENSION PLASTY excision trapexzium tendon transfer, microlink suspection plasty;  Surgeon: Curia Arley, MD;  Location: [facility];  Service: Orthopedics;  Laterality: Right;  axillary block   CATARACT EXTRACTION Right 2009       CESAREAN SECTION  1973, 1977   x2   EAR CYST EXCISION Left 08/15/2014   Procedure: LEFT THUMB DEBRIDEMENT INTERPHALANGEAL JOINT LEFT THUMB REMOVAL MUCOID CYST ;  Surgeon: Arley Curia, MD;  Location: [facility];  Service: Orthopedics;  Laterality: Left;   FINGER SURGERY  2002   lt long finger mass    GANGLION CYST EXCISION Left 2/02   TOTAL ABDOMINAL HYSTERECTOMY W/ BILATERAL SALPINGOOPHORECTOMY  1997   TUBAL LIGATION  1977   FAMILY HISTORY Family History  Problem Relation Age of Onset   Heart disease Mother    Hyperlipidemia Mother    Thyroid  disease Mother    Osteoporosis Mother    Dementia Mother    Breast cancer Mother 57   Scleroderma Mother    Scleroderma Father    Diabetes Father    Heart disease Father    Cancer Father        prostate   Cancer Brother        Eye   Cancer Brother        gleoblastoma   Breast cancer Daughter 45   SOCIAL HISTORY Social History   Tobacco Use   Smoking status: Never   Smokeless tobacco: Never  Vaping Use   Vaping status: Never Used  Substance Use  Topics   Alcohol use: Yes    Comment: glass of wine weeklu   Drug use: No       OPHTHALMIC EXAM:  Base Eye Exam     Visual Acuity (Snellen - Linear)       Right Left   Dist cc 20/20 -1 20/20 -1    Correction: Glasses         Tonometry (Tonopen, 8:54 AM)       Right Left   Pressure 14 13         Pupils       Pupils Dark Light Shape React APD   Right PERRL 2 1 Round Brisk None   Left PERRL 2 1 Round Brisk None         Visual Fields       Left Right    Full Full         Extraocular Movement       Right Left    Full, Ortho Full, Ortho         Neuro/Psych     Oriented x3: Yes   Mood/Affect: Normal         Dilation     Both eyes: 1.0% Mydriacyl, 2.5% Phenylephrine @ 8:55 AM           Slit Lamp and Fundus Exam     Slit Lamp Exam       Right Left   Lids/Lashes Dermatochalasis - upper lid, Meibomian gland dysfunction Dermatochalasis - upper lid, Meibomian gland dysfunction   Conjunctiva/Sclera White and quiet White and quiet   Cornea mild arcus, trace fine Punctate epithelial erosions, mild tear film debris, well healed cataract wound mild arcus, trace fine Punctate epithelial erosions, well healed cataract wound, mild tear film  debris, trace EBMD   Anterior Chamber deep and clear Deep and quiet   Iris Round and moderately dilated Round and moderately dilated   Lens PC IOL in good position, open pc PC IOL in good position, open pc   Anterior Vitreous clear mild vitreous syneresis, Posterior vitreous detachment         Fundus Exam       Right Left   Disc Pink and Sharp Pink and Sharp   C/D Ratio 0.4 0.4   Macula Flat, good foveal reflex, mild RPE mottling, No heme or edema Flat, Blunted foveal reflex, RPE mottling and clumping, No heme or edema   Vessels mild attenuation, mild tortuosity mild attenuation, mild tortuosity   Periphery Attached, no heme Attached, amelanotic nevus with overlying pigment clumping, drusen, no SRF, no orange pigment, approx 4x5 DD -- just outside distal ST arcades--stable           Refraction     Wearing Rx       Sphere Cylinder Axis Add   Right -0.50 +0.75 050 +2.00   Left -0.25 +0.25 032 +2.00            IMAGING AND PROCEDURES  Imaging and Procedures for 05/30/2024  OCT, Retina - OU - Both Eyes       Right Eye Quality was good. Central Foveal Thickness: 277. Progression has been stable. Findings include normal foveal contour, no IRF, no SRF.   Left Eye Quality was good. Central Foveal Thickness: 274. Progression has been stable. Findings include normal foveal contour, no IRF, no SRF, pigment epithelial detachment (Mildly elevated hyper reflective choroidal lesion with overlying drusen and PED; no SRF--stable from prior).   Notes *Images captured and stored  on drive  Diagnosis / Impression:  OD: NFP, no IRF/SRF  OS: Mildly elevated hyper reflective choroidal lesion with overlying drusen and PED; no SRF---stable from prior  Clinical management:  See below  Abbreviations: NFP - Normal foveal profile. CME - cystoid macular edema. PED - pigment epithelial detachment. IRF - intraretinal fluid. SRF - subretinal fluid. EZ - ellipsoid zone. ERM - epiretinal  membrane. ORA - outer retinal atrophy. ORT - outer retinal tubulation. SRHM - subretinal hyper-reflective material. IRHM - intraretinal hyper-reflective material      Color Fundus Photography Optos - OU - Both Eyes       Right Eye Progression has been stable. Disc findings include pallor. Macula : normal observations. Vessels : normal observations. Periphery : RPE abnormality.   Left Eye Progression has been stable. Disc findings include pallor. Macula : normal observations. Vessels : normal observations. Periphery : RPE abnormality.   Notes Images stored on drive;   Impression: OS: choroidal nevus: hypopigmented choroidal lesion with +drusen, ST midzone--stable from prior             ASSESSMENT/PLAN:    ICD-10-CM   1. 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 Pseudophakia  Z96.1     3. Essential hypertension  I10     4. Hypertensive retinopathy of both eyes  H35.033      1. 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  - mostly amelanotic choroidal lesion just outside distal ST arcades ~4x5DD in size  - no visual symptoms with BCVA 20/20 OS  - +pigment clumping and drusen, no SRF, no orange pigment  - thickness < 2mm - pt reports brother with history of choroidal melanoma, s/p enucleation, who died ~10 yrs after enucleation from liver mets - OCT shows Mildly elevated hyper reflective choroidal lesion with overlying drusen and PED; no SRF -- stable from prior  - FA (09.06.22) shows choroidal lesion with early blockage and late staining just outside ST arcades  - repeat FA 01.05.24 unchanged from prior  - discussed findings, prognosis, and treatment options  - [facility] Greven [facility] as well   - f/u here in 1 year, w/DFE/OCT, Optos imaging  2. Pseudophakia OU  - s/p CE/IOL (Dr. Cleatus)  - s/p YAG OU w/ Dr. Waylan  - IOLs in good position, doing well  - monitor   3,4. Hypertensive retinopathy  OU - discussed importance of tight BP control - monitor    Ophthalmic Meds Ordered this visit:  No orders of the defined types were placed in this encounter.    Return in about 1 year (around 05/30/2025) for f/u Nevus, OS, DFE, OCT, Optos color photos.  There are no Patient Instructions on file for this visit.  This document serves as a record of services personally performed by Redell JUDITHANN Hans, MD, PhD. It was created on their behalf by Almetta Pesa, an ophthalmic technician. The creation of this record is the provider's dictation and/or activities during the visit.    Electronically signed by: Almetta Pesa, OA, 06/07/24  4:31 PM  This document serves as a record of services personally performed by Redell JUDITHANN Hans, MD, PhD. It was created on their behalf by Wanda GEANNIE Keens, COT an ophthalmic technician. The creation of this record is the provider's dictation and/or activities during the visit.    Electronically signed by:  Wanda GEANNIE Keens, COT  06/07/24 4:31 PM  Redell JUDITHANN Hans, M.D., Ph.D. Diseases & Surgery of the Retina and Vitreous [facility]  I have reviewed the above documentation for accuracy and completeness, and I agree with the above. Redell JUDITHANN Hans, M.D., Ph.D. 06/07/24 4:32 PM   Abbreviations: M myopia (nearsighted); A astigmatism; H hyperopia (farsighted); P presbyopia; Mrx spectacle prescription;  CTL contact lenses; OD right eye; OS left eye; OU both eyes  XT exotropia; ET esotropia; PEK punctate epithelial keratitis; PEE punctate epithelial erosions; DES dry eye syndrome; MGD meibomian gland dysfunction; ATs artificial tears; PFAT's preservative free artificial tears; NSC nuclear sclerotic cataract; PSC posterior subcapsular cataract; ERM epi-retinal membrane; PVD posterior vitreous detachment; RD retinal detachment; DM diabetes mellitus; DR diabetic retinopathy; NPDR non-proliferative diabetic retinopathy; PDR proliferative  diabetic retinopathy; CSME clinically significant macular edema; DME diabetic macular edema; dbh dot blot hemorrhages; CWS cotton wool spot; POAG primary open angle glaucoma; C/D cup-to-disc ratio; HVF humphrey visual field; GVF goldmann visual field; OCT optical coherence tomography; IOP intraocular pressure; BRVO Branch retinal vein occlusion; CRVO central retinal vein occlusion; CRAO central retinal artery occlusion; BRAO branch retinal artery occlusion; RT retinal tear; SB scleral buckle; PPV pars plana vitrectomy; VH Vitreous hemorrhage; PRP panretinal laser photocoagulation; IVK intravitreal kenalog; VMT vitreomacular traction; MH Macular hole;  NVD neovascularization of the disc; NVE neovascularization elsewhere; AREDS age related eye disease study; ARMD age related macular degeneration; POAG primary open angle glaucoma; EBMD epithelial/anterior basement membrane dystrophy; ACIOL anterior chamber intraocular lens; IOL intraocular lens; PCIOL posterior chamber intraocular lens; Phaco/IOL phacoemulsification with intraocular lens placement; PRK photorefractive keratectomy; LASIK laser assisted in situ keratomileusis; HTN hypertension; DM diabetes mellitus; COPD chronic obstructive pulmonary disease  "

## 2024-05-30 ENCOUNTER — Encounter (INDEPENDENT_AMBULATORY_CARE_PROVIDER_SITE_OTHER): Payer: Self-pay | Admitting: Ophthalmology

## 2024-05-30 ENCOUNTER — Ambulatory Visit (INDEPENDENT_AMBULATORY_CARE_PROVIDER_SITE_OTHER): Payer: PPO | Admitting: Ophthalmology

## 2024-05-30 DIAGNOSIS — D3132 Benign neoplasm of left choroid: Secondary | ICD-10-CM

## 2024-05-30 DIAGNOSIS — Z961 Presence of intraocular lens: Secondary | ICD-10-CM

## 2024-05-30 DIAGNOSIS — I10 Essential (primary) hypertension: Secondary | ICD-10-CM

## 2024-05-30 DIAGNOSIS — H35033 Hypertensive retinopathy, bilateral: Secondary | ICD-10-CM | POA: Diagnosis not present

## 2024-06-07 ENCOUNTER — Encounter (INDEPENDENT_AMBULATORY_CARE_PROVIDER_SITE_OTHER): Payer: Self-pay | Admitting: Ophthalmology

## 2024-08-09 ENCOUNTER — Ambulatory Visit (HOSPITAL_BASED_OUTPATIENT_CLINIC_OR_DEPARTMENT_OTHER): Admitting: Obstetrics & Gynecology

## 2024-11-19 ENCOUNTER — Ambulatory Visit: Admitting: Family

## 2024-11-19 ENCOUNTER — Other Ambulatory Visit

## 2025-06-03 ENCOUNTER — Encounter (INDEPENDENT_AMBULATORY_CARE_PROVIDER_SITE_OTHER): Admitting: Ophthalmology
